# Patient Record
Sex: Male | Born: 1960 | Hispanic: Yes | Marital: Married | Smoking: Former smoker
Health system: Southern US, Community
[De-identification: ages and names within clinical notes are randomized; demographics above are authoritative.]

## PROBLEM LIST (undated history)

## (undated) DIAGNOSIS — C61 Malignant neoplasm of prostate: Secondary | ICD-10-CM

## (undated) DIAGNOSIS — J9819 Other pulmonary collapse: Secondary | ICD-10-CM

## (undated) DIAGNOSIS — K219 Gastro-esophageal reflux disease without esophagitis: Secondary | ICD-10-CM

## (undated) DIAGNOSIS — F17200 Nicotine dependence, unspecified, uncomplicated: Secondary | ICD-10-CM

## (undated) DIAGNOSIS — I10 Essential (primary) hypertension: Secondary | ICD-10-CM

## (undated) DIAGNOSIS — E785 Hyperlipidemia, unspecified: Secondary | ICD-10-CM

## (undated) DIAGNOSIS — S32000A Wedge compression fracture of unspecified lumbar vertebra, initial encounter for closed fracture: Secondary | ICD-10-CM

## (undated) DIAGNOSIS — R7303 Prediabetes: Secondary | ICD-10-CM

## (undated) DIAGNOSIS — E039 Hypothyroidism, unspecified: Secondary | ICD-10-CM

## (undated) DIAGNOSIS — F17201 Nicotine dependence, unspecified, in remission: Secondary | ICD-10-CM

## (undated) DIAGNOSIS — S32009A Unspecified fracture of unspecified lumbar vertebra, initial encounter for closed fracture: Secondary | ICD-10-CM

## (undated) HISTORY — DX: Essential (primary) hypertension: I10

## (undated) HISTORY — DX: Other pulmonary collapse: J98.19

## (undated) HISTORY — DX: Nicotine dependence, unspecified, uncomplicated: F17.200

## (undated) HISTORY — PX: ANKLE SURGERY: SHX546

---

## 2010-10-10 ENCOUNTER — Inpatient Hospital Stay: Payer: Self-pay | Admitting: Surgery

## 2010-10-10 ENCOUNTER — Ambulatory Visit: Payer: Self-pay | Admitting: Internal Medicine

## 2010-10-19 ENCOUNTER — Inpatient Hospital Stay: Payer: Self-pay | Admitting: Surgery

## 2010-10-19 ENCOUNTER — Ambulatory Visit: Payer: Self-pay | Admitting: Surgery

## 2010-11-03 ENCOUNTER — Ambulatory Visit: Payer: Self-pay | Admitting: Surgery

## 2010-11-11 ENCOUNTER — Ambulatory Visit: Payer: Self-pay | Admitting: Surgery

## 2010-11-23 ENCOUNTER — Ambulatory Visit: Payer: Self-pay | Admitting: Surgery

## 2010-12-26 DIAGNOSIS — J9819 Other pulmonary collapse: Secondary | ICD-10-CM

## 2010-12-26 HISTORY — DX: Other pulmonary collapse: J98.19

## 2010-12-26 HISTORY — PX: CHEST TUBE INSERTION: SHX231

## 2011-12-27 HISTORY — PX: OTHER SURGICAL HISTORY: SHX169

## 2012-07-28 IMAGING — CT CT CHEST W/ CM
2 of 3 series · 16 of 31 positions shown, 19 images · IV contrast (agent unspecified)
Comparison: none

REASON FOR EXAM: left pneumo, 40 pack year smoker. evaluate for bleb
disease. mass.
COMMENTS:

PROCEDURE:     CT  - CT CHEST WITH CONTRAST  - October 11, 2010  [DATE]
RESULT:     Comparison: Chest radiographs 10/10/2010 and 10/11/2010
TECHNIQUE: Multiple axial images of the chest were obtained with 70 mL
[id number] intravenous contrast.

[Series 3: lung windows · axial · 0.73mm/px · z∈[+458,+722]mm · 10 of 67 slices shown, 13 images]
[im 7/67  mediastinal]
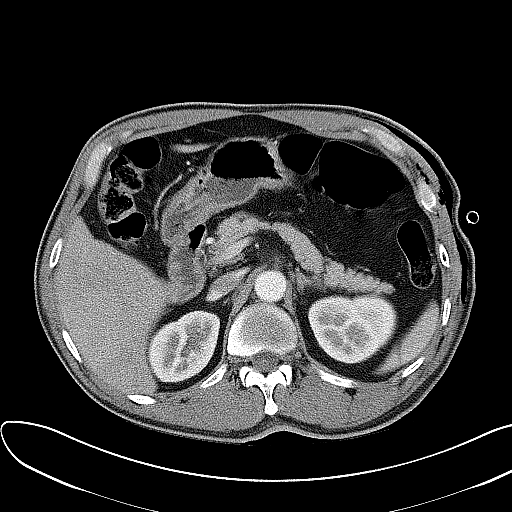
[im 7/67  lung]
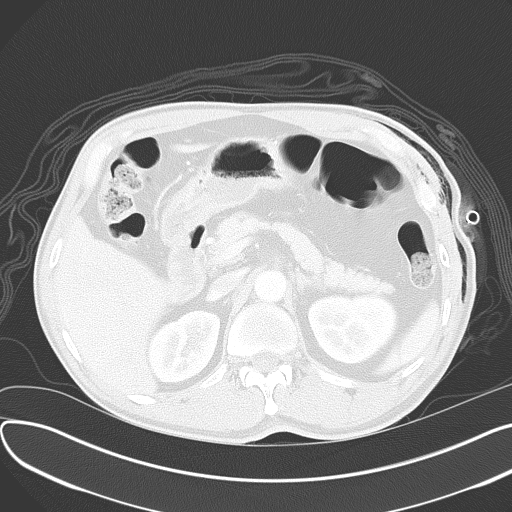
[im 14/67  lung]
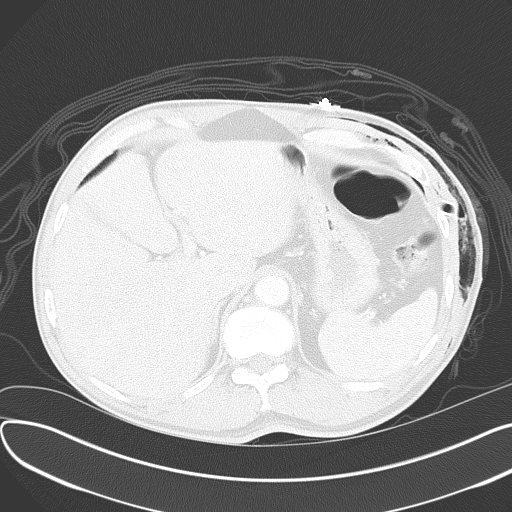
[im 20/67  lung]
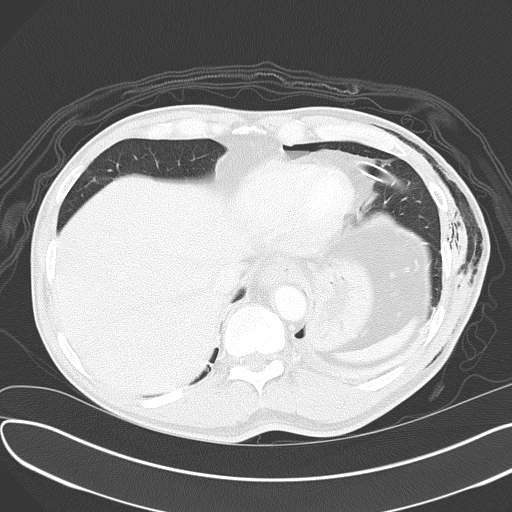
[im 27/67  lung]
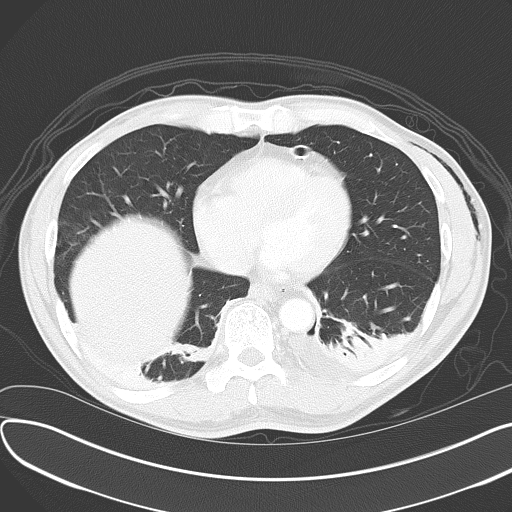
[im 33/67  mediastinal]
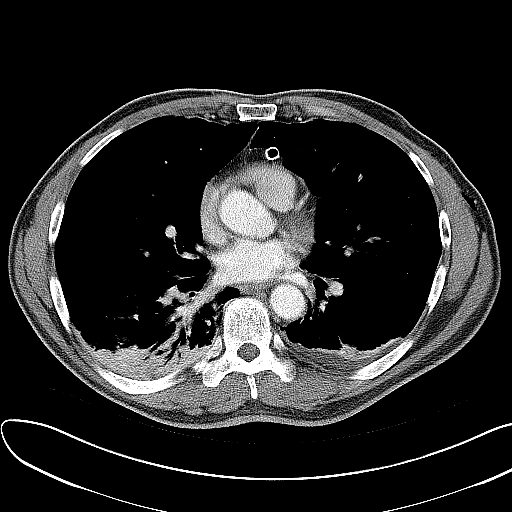
[im 33/67  lung]
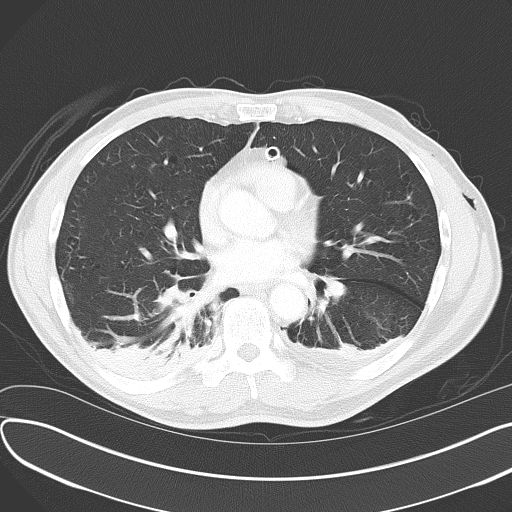
[im 34/67  lung]
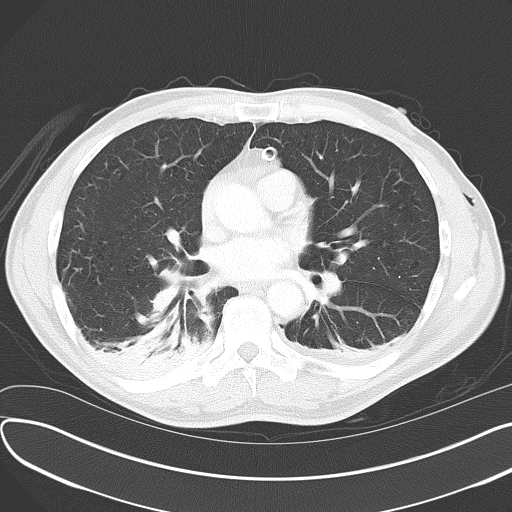
[im 40/67  lung]
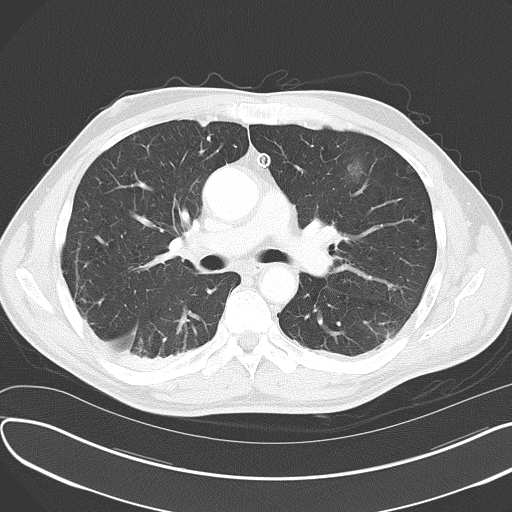
[im 47/67  lung]
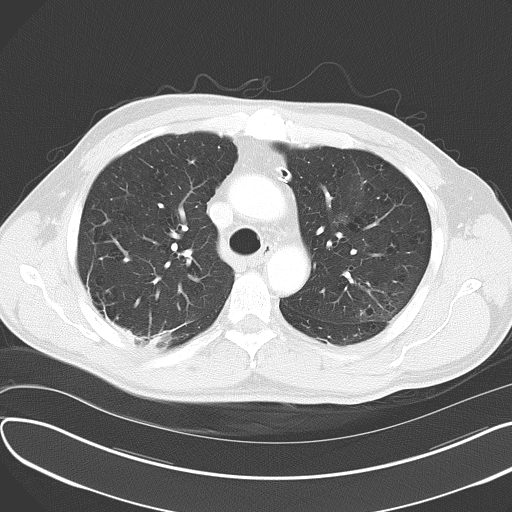
[im 53/67  mediastinal]
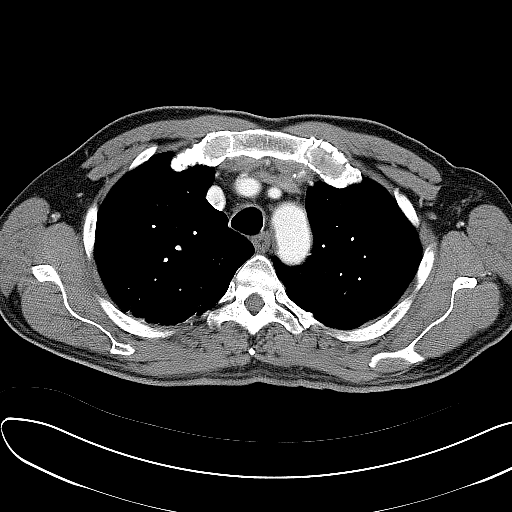
[im 53/67  lung]
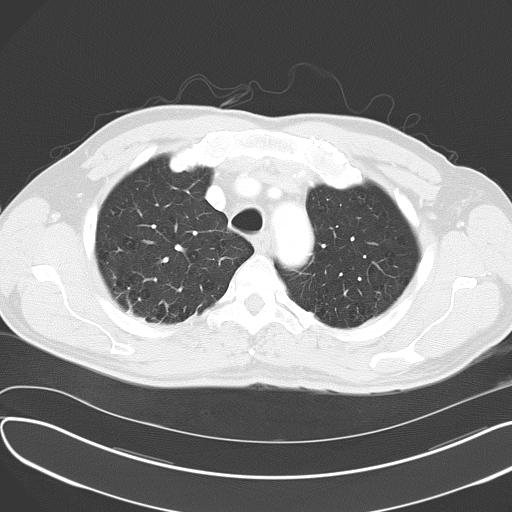
[im 60/67  lung]
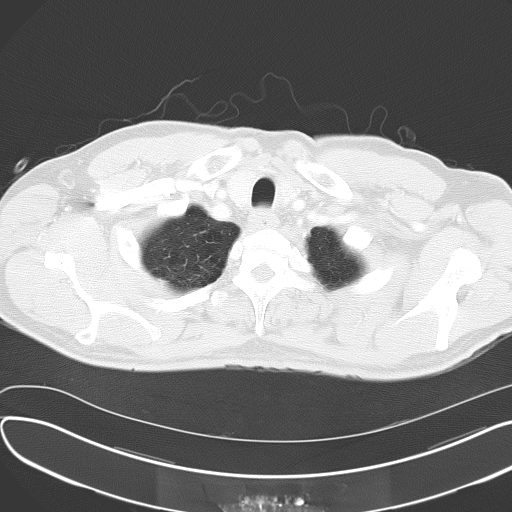

[Series 4: hi res · axial · 0.73mm/px · z∈[+483,+707]mm · 6 of 43 slices shown]
[im 8/43  lung]
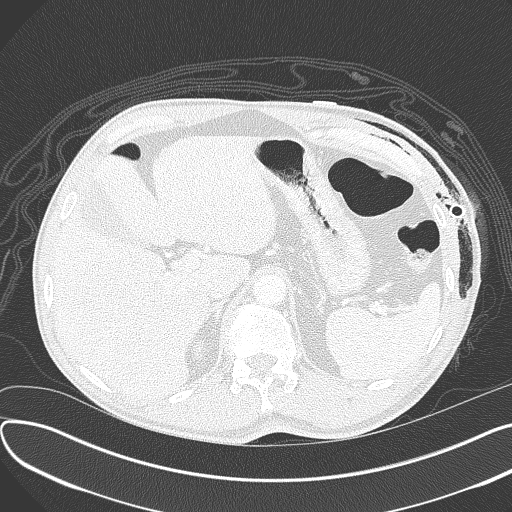
[im 15/43  lung]
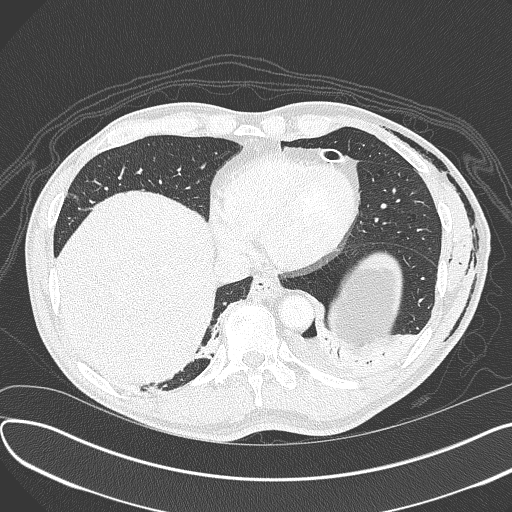
[im 21/43  lung]
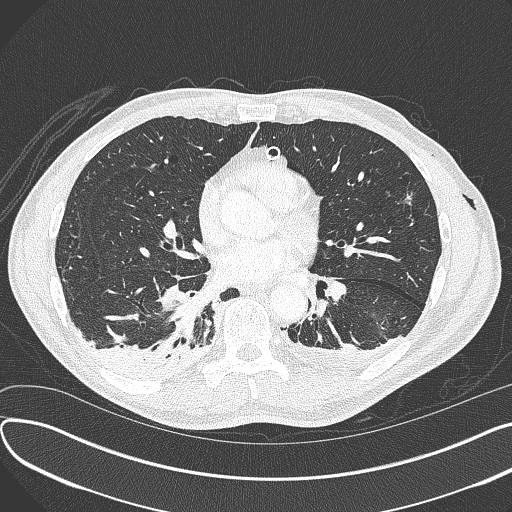
[im 22/43  lung]
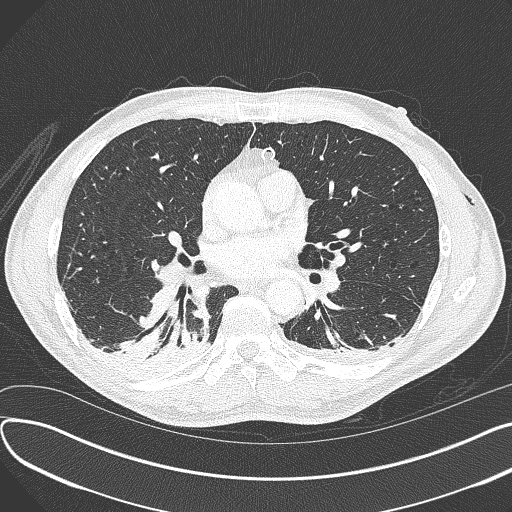
[im 29/43  lung]
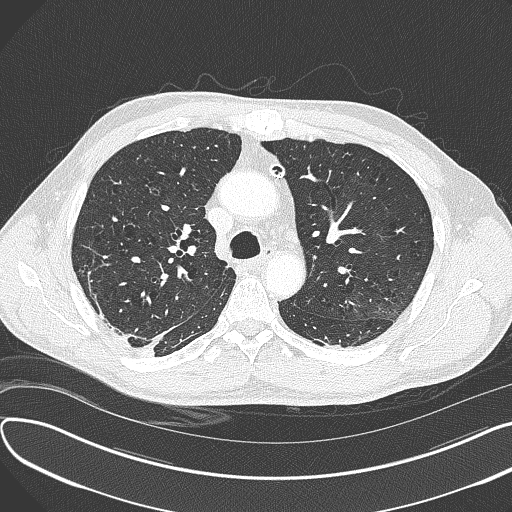
[im 36/43  lung]
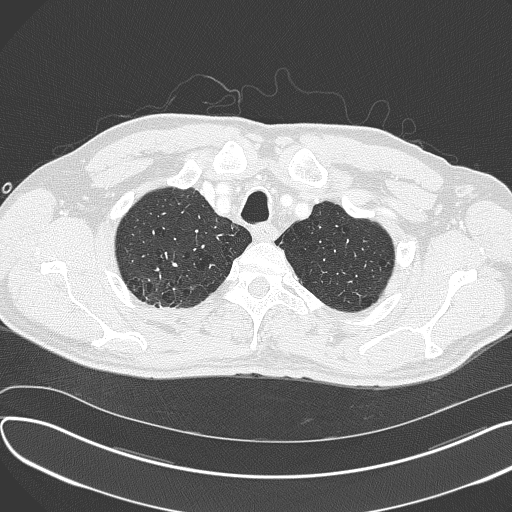

[16 of 31 positions shown; findings below may reference images not displayed]

FINDINGS: No mediastinal, hilar, or axillary lymphadenopathy. There are small
low-attenuation lesions in the liver, which are too small to characterize.
There is a left-sided chest tube, which courses along the anterior, left
side of the mediastinum.

There is mild to moderate centrilobular emphysema. Basilar opacities likely
represent atelectasis. There is a small amount of residual air in the left
pleural space.. There are scattered ground glass opacities in the left lung.
A small amount of air is seen along the left major fissure. No discrete mass
identified. Small low-attenuation within the right side of the trachea
likely represents retained mucus. There is a small left pleural effusion.

No aggressive lytic or sclerotic osseous lesions identified. There is
subcutaneous is seen extending along the left chest wall.
IMPRESSION: 1. Mild to moderate centrilobular emphysema, without discrete bulla.
2. Small amount of residual left hydropneumothorax.
3. Mild groundglass opacities in the left lung are nonspecific, but may be
infectious or inflammatory.

## 2012-08-05 IMAGING — CR DG CHEST 2V
1 series · 3 of 3 positions shown · non-contrast
Comparison: none

REASON FOR EXAM: follow left pneumothorax; recent hospitalization with CT
COMMENTS:

[Series 1: view not recorded · 0.17mm/px · 3 of 3 slices shown]
[im 1/3]
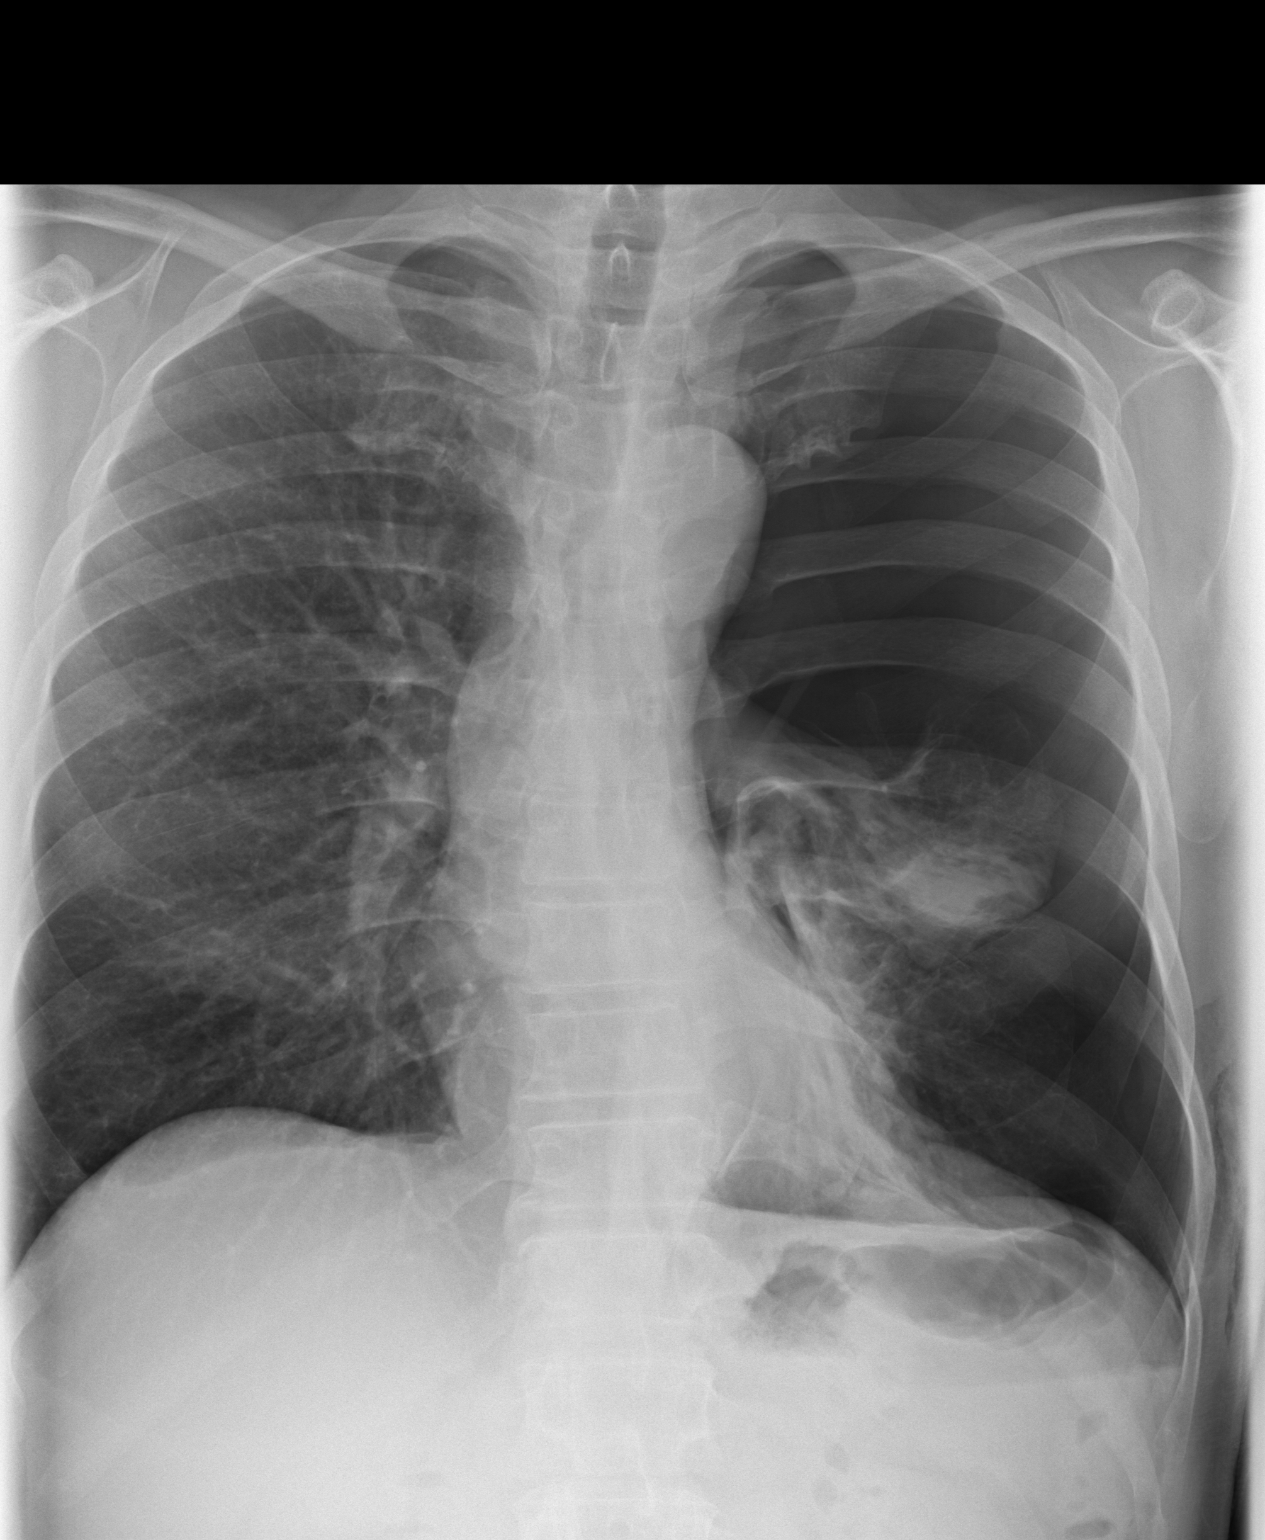
[im 2/3]
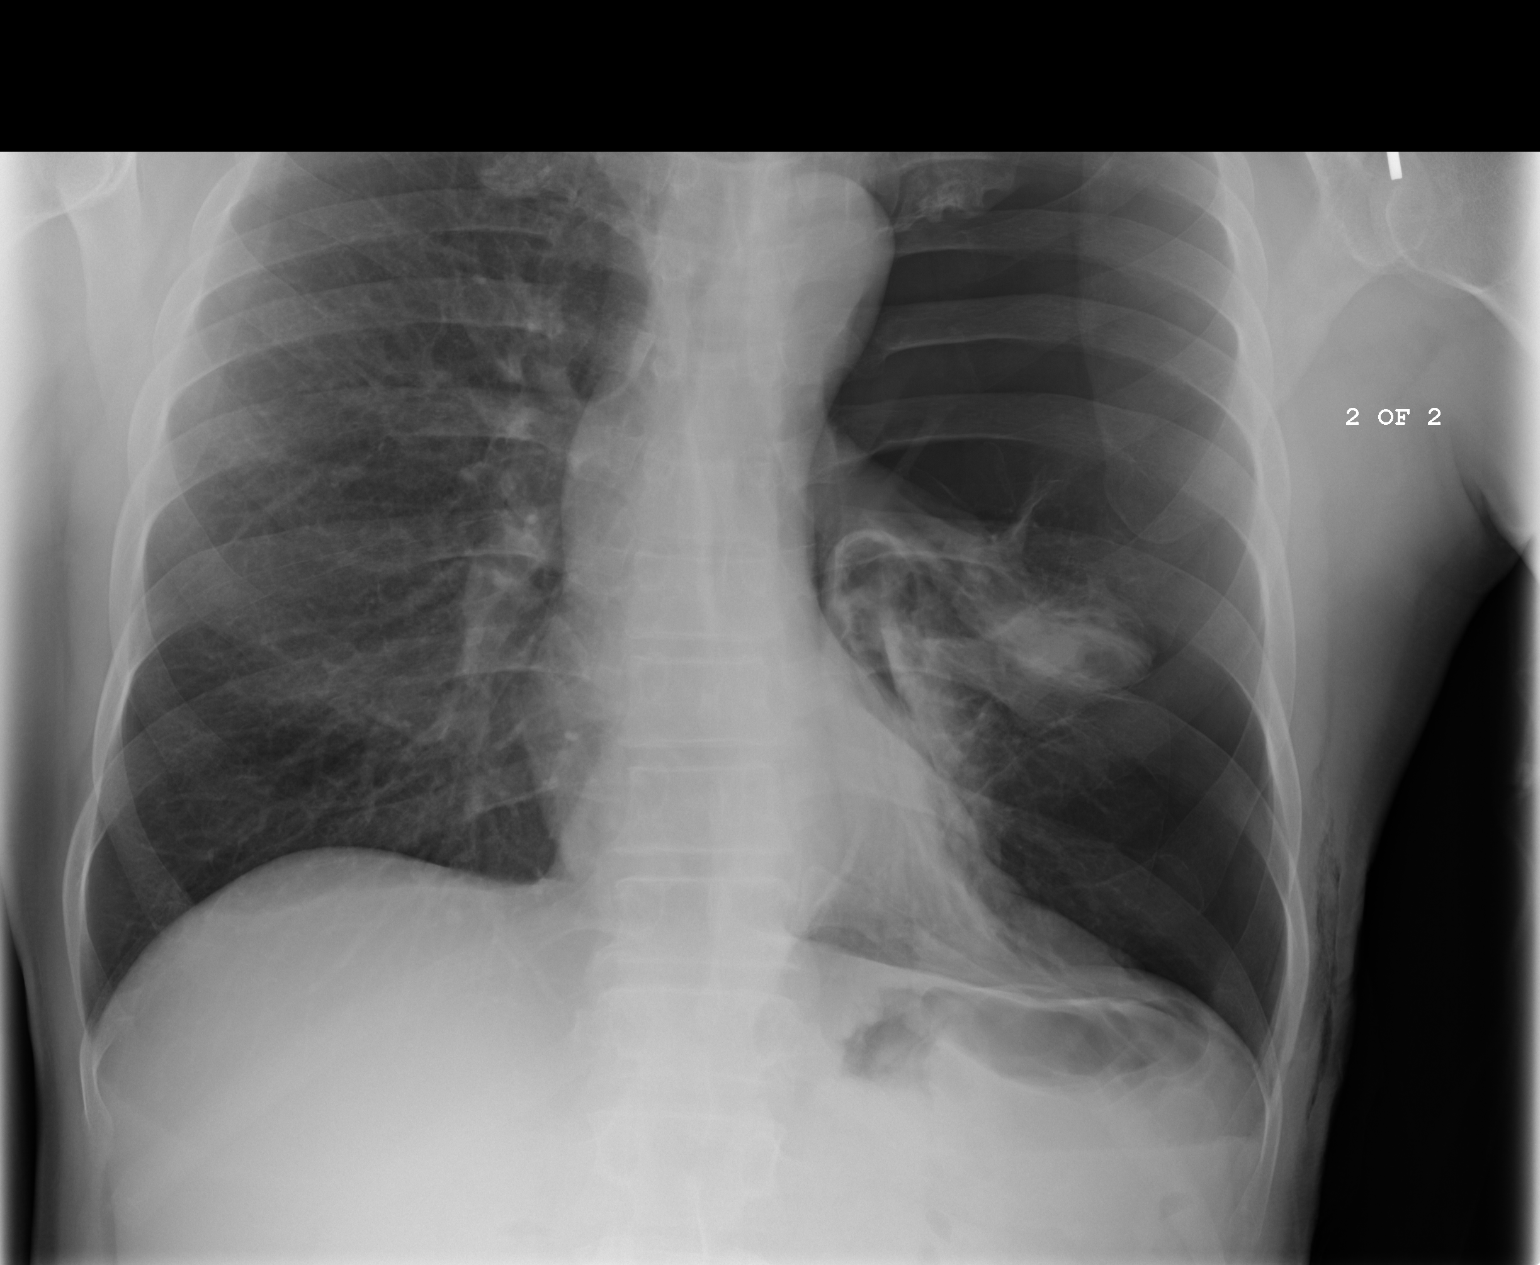
[im 3/3]
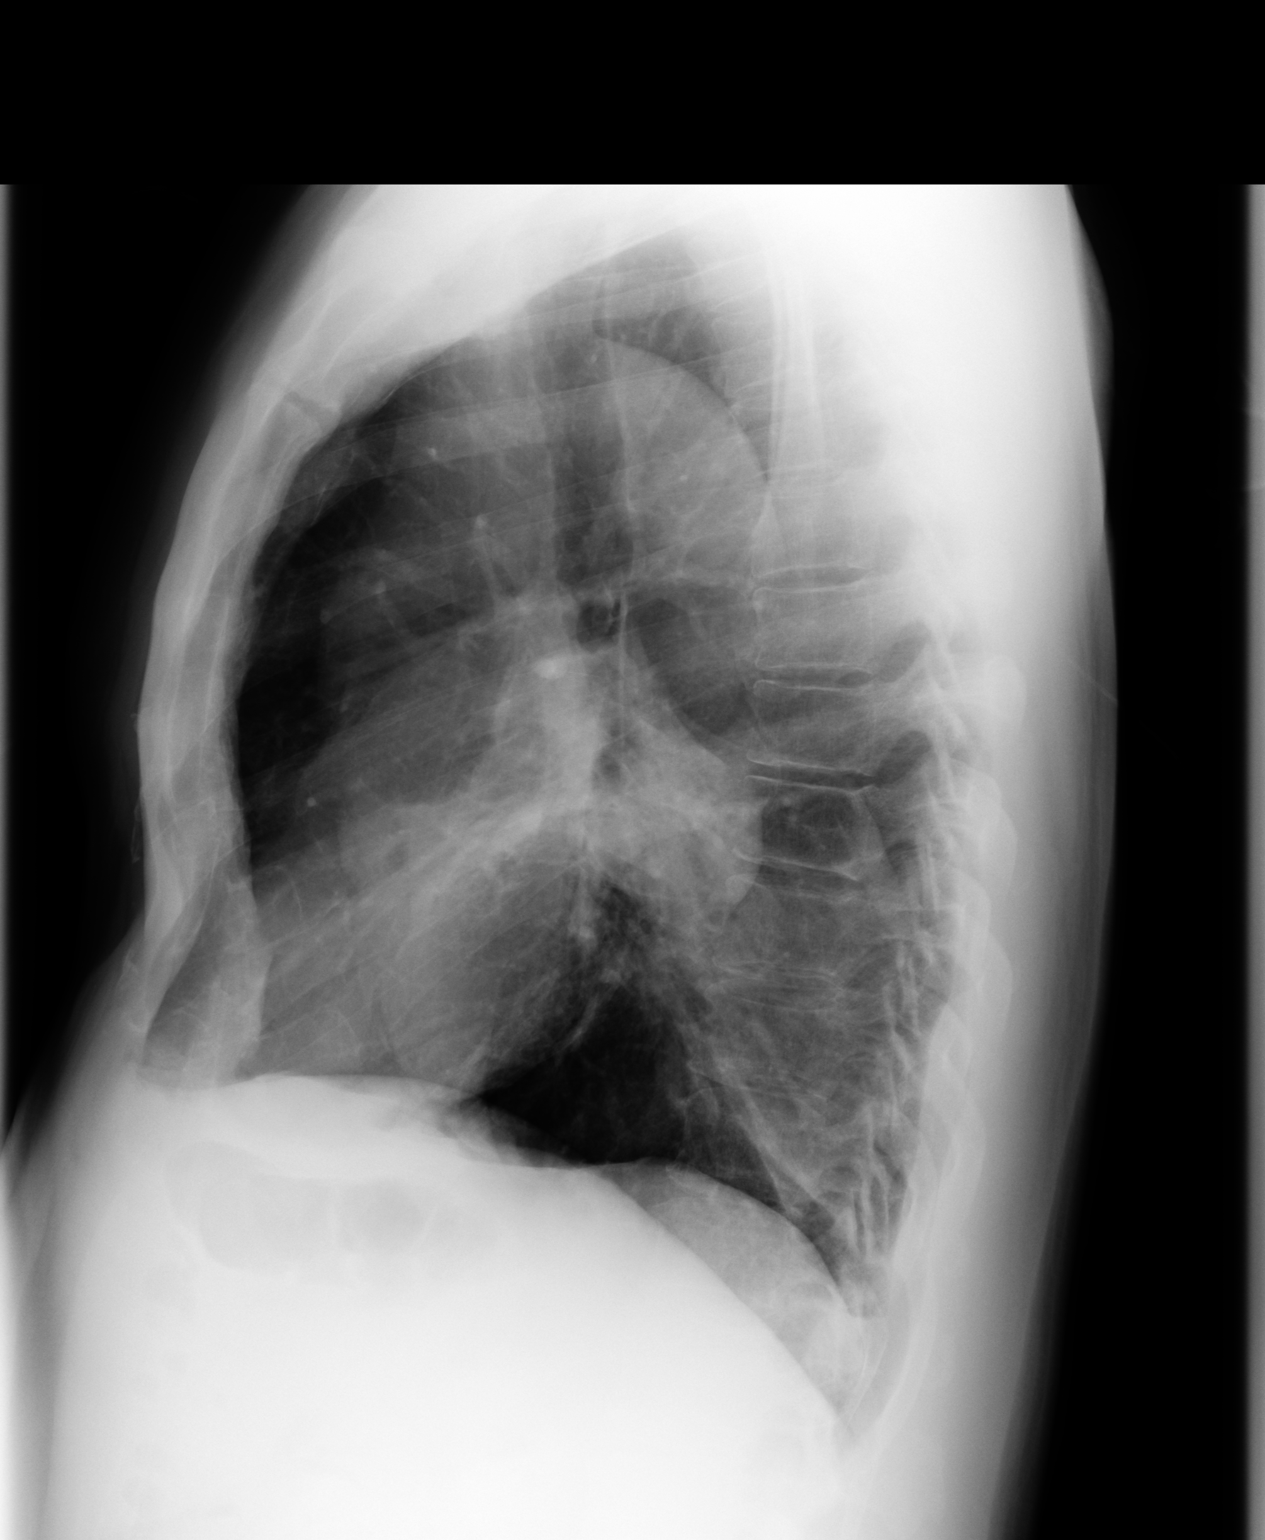

[3 of 3 positions shown; findings below may reference images not displayed]

PROCEDURE:     MDR - MDR CHEST PA(OR AP) AND LATERAL  - October 19, 2010  [DATE]

RESULT:     Comparison is made to the prior exam of 10/13/2010.

There is observed a near total pneumothorax on the left. There is no shift
of the midline structures to the left which suggests an element of tension
is present. The right lung field is clear. The heart size is normal.
IMPRESSION: There is observed near complete left pneumothorax.

## 2012-08-05 IMAGING — CR DG CHEST 1V PORT
1 series · 1 of 1 positions shown · non-contrast
Comparison: none

REASON FOR EXAM: left chest tube placement
COMMENTS:

[view not recorded]
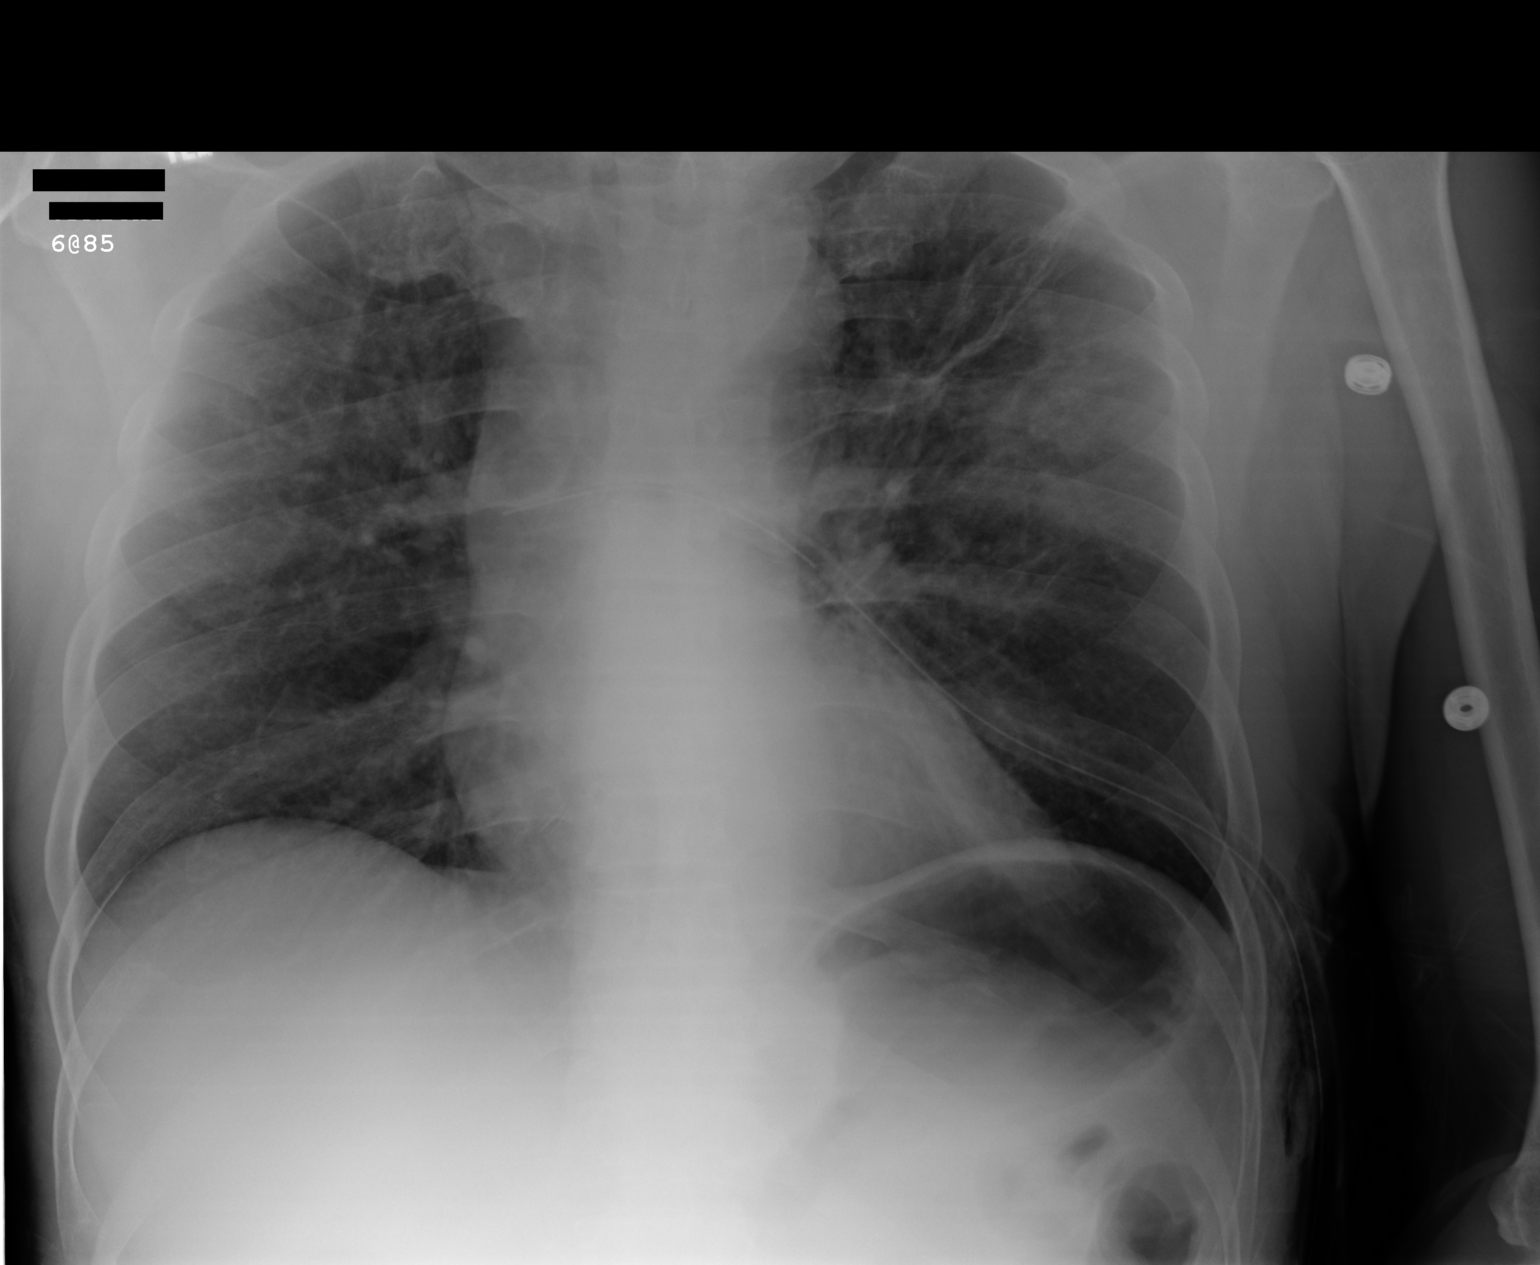

[1 of 1 positions shown; findings below may reference images not displayed]

PROCEDURE:     DXR - DXR PORTABLE CHEST SINGLE VIEW  - October 19, 2010 [DATE]

RESULT:     Comparison is made to the prior exam of earlier this day. A left
chest tube is now present. There has been complete or near complete
reexpansion of the left lung. There is thickening of the lung markings about
the hilar region compatible with residual atelectasis. The right lung field
is clear. Heart size is normal. A small amount of subcutaneous emphysema is
observed along the left lateral chest wall.
IMPRESSION: 1. There has been complete or near complete reexpansion of the left lung.
2. A left chest tube is now present.

## 2013-02-16 ENCOUNTER — Ambulatory Visit: Payer: Self-pay | Admitting: Physician Assistant

## 2013-02-16 LAB — SYNOVIAL CELL COUNT + DIFF, W/ CRYSTALS
Basophil: 0 %
Crystals, Joint Fluid: NONE SEEN
Eosinophil: 0 %
Nucleated Cell Count: 588 /mm3
Other Cells BF: 0 %
Other Mononuclear Cells: 44 %

## 2013-07-04 ENCOUNTER — Ambulatory Visit: Payer: Self-pay | Admitting: Family Medicine

## 2017-10-09 ENCOUNTER — Encounter: Payer: Self-pay | Admitting: Internal Medicine

## 2017-10-09 ENCOUNTER — Ambulatory Visit (INDEPENDENT_AMBULATORY_CARE_PROVIDER_SITE_OTHER): Payer: Managed Care, Other (non HMO) | Admitting: Internal Medicine

## 2017-10-09 VITALS — BP 152/88 | HR 69 | Ht 65.0 in | Wt 139.0 lb

## 2017-10-09 DIAGNOSIS — R030 Elevated blood-pressure reading, without diagnosis of hypertension: Secondary | ICD-10-CM | POA: Diagnosis not present

## 2017-10-09 DIAGNOSIS — F17200 Nicotine dependence, unspecified, uncomplicated: Secondary | ICD-10-CM

## 2017-10-09 DIAGNOSIS — Z0000 Encounter for general adult medical examination without abnormal findings: Secondary | ICD-10-CM | POA: Diagnosis not present

## 2017-10-09 DIAGNOSIS — R351 Nocturia: Secondary | ICD-10-CM | POA: Diagnosis not present

## 2017-10-09 DIAGNOSIS — I10 Essential (primary) hypertension: Secondary | ICD-10-CM | POA: Insufficient documentation

## 2017-10-09 HISTORY — DX: Nicotine dependence, unspecified, uncomplicated: F17.200

## 2017-10-09 MED ORDER — VARENICLINE TARTRATE 0.5 MG X 11 & 1 MG X 42 PO MISC: ORAL | 0 refills | Status: DC

## 2017-10-09 NOTE — Patient Instructions (Signed)
Check blood pressure 3 times per week and record readings - bring to next appointment  [place] stands for "Dietary Approaches to Stop Hypertension." The DASH eating plan is a healthy eating plan that has been shown to reduce high blood pressure (hypertension). It may also reduce your risk for type 2 diabetes, heart disease, and stroke. The DASH eating plan may also help with weight loss. What are tips for following this plan? General guidelines  Avoid eating more than 2,300 mg (milligrams) of salt (sodium) a day. If you have hypertension, you may need to reduce your sodium intake to 1,500 mg a day.  Limit alcohol intake to no more than 1 drink a day for nonpregnant women and 2 drinks a day for men. One drink equals 12 oz of beer, 5 oz of wine, or 1 oz of hard liquor.  Work with your health care provider to maintain a healthy body weight or to lose weight. Ask what an ideal weight is for you.  Get at least 30 minutes of exercise that causes your heart to beat faster (aerobic exercise) most days of the week. Activities may include walking, swimming, or biking.  Work with your health care provider or diet and nutrition specialist (dietitian) to adjust your eating plan to your individual calorie needs. Reading food labels  Check food labels for the amount of sodium per serving. Choose foods with less than 5 percent of the Daily Value of sodium. Generally, foods with less than 300 mg of sodium per serving fit into this eating plan.  To find whole grains, look for the word "whole" as the first word in the ingredient list. Shopping  Buy products labeled as "low-sodium" or "no salt added."  Buy fresh foods. Avoid canned foods and premade or frozen meals. Cooking  Avoid adding salt when cooking. Use salt-free seasonings or herbs instead of table salt or sea salt. Check with your health care provider or pharmacist before using salt substitutes.  Do not fry foods. Cook foods using  healthy methods such as baking, boiling, grilling, and broiling instead.  Cook with heart-healthy oils, such as olive, canola, soybean, or sunflower oil. Meal planning   Eat a balanced diet that includes: ? 5 or more servings of fruits and vegetables each day. At each meal, try to fill half of your plate with fruits and vegetables. ? Up to 6-8 servings of whole grains each day. ? Less than 6 oz of lean meat, poultry, or fish each day. A 3-oz serving of meat is about the same size as a deck of cards. One egg equals 1 oz. ? 2 servings of low-fat dairy each day. ? A serving of nuts, seeds, or beans 5 times each week. ? Heart-healthy fats. Healthy fats called Omega-3 fatty acids are found in foods such as flaxseeds and coldwater fish, like sardines, salmon, and mackerel.  Limit how much you eat of the following: ? Canned or prepackaged foods. ? Food that is high in trans fat, such as fried foods. ? Food that is high in saturated fat, such as fatty meat. ? Sweets, desserts, sugary drinks, and other foods with added sugar. ? Full-fat dairy products.  Do not salt foods before eating.  Try to eat at least 2 vegetarian meals each week.  Eat more home-cooked food and less restaurant, buffet, and fast food.  When eating at a restaurant, ask that your food be prepared with less salt or no salt, if possible. What foods are  recommended? The items listed may not be a complete list. Talk with your dietitian about what dietary choices are best for you. Grains Whole-grain or whole-wheat bread. Whole-grain or whole-wheat pasta. Brown rice. [place]. Bulgur. Whole-grain and low-sodium cereals. Pita bread. Low-fat, low-sodium crackers. Whole-wheat flour tortillas. Vegetables Fresh or frozen vegetables (raw, steamed, roasted, or grilled). Low-sodium or reduced-sodium tomato and vegetable juice. Low-sodium or reduced-sodium tomato sauce and tomato paste. Low-sodium or reduced-sodium canned  vegetables. Fruits All fresh, dried, or frozen fruit. Canned fruit in natural juice (without added sugar). Meat and other protein foods Skinless chicken or [place]. Ground chicken or [place]. Pork with fat trimmed off. Fish and seafood. Egg whites. Dried beans, peas, or lentils. Unsalted nuts, nut butters, and seeds. Unsalted canned beans. Lean cuts of beef with fat trimmed off. Low-sodium, lean deli meat. Dairy Low-fat (1%) or fat-free (skim) milk. Fat-free, low-fat, or reduced-fat cheeses. Nonfat, low-sodium ricotta or cottage cheese. Low-fat or nonfat yogurt. Low-fat, low-sodium cheese. Fats and oils Soft margarine without trans fats. Vegetable oil. Low-fat, reduced-fat, or light mayonnaise and salad dressings (reduced-sodium). Canola, safflower, olive, soybean, and sunflower oils. Avocado. Seasoning and other foods Herbs. Spices. Seasoning mixes without salt. Unsalted popcorn and pretzels. Fat-free sweets. What foods are not recommended? The items listed may not be a complete list. Talk with your dietitian about what dietary choices are best for you. Grains Baked goods made with fat, such as croissants, muffins, or some breads. Dry pasta or rice meal packs. Vegetables Creamed or fried vegetables. Vegetables in a cheese sauce. Regular canned vegetables (not low-sodium or reduced-sodium). Regular canned tomato sauce and paste (not low-sodium or reduced-sodium). Regular tomato and vegetable juice (not low-sodium or reduced-sodium). Angie Fava. Olives. Fruits Canned fruit in a light or heavy syrup. Fried fruit. Fruit in cream or butter sauce. Meat and other protein foods Fatty cuts of meat. Ribs. Fried meat. Berniece Salines. Sausage. Bologna and other processed lunch meats. Salami. Fatback. Hotdogs. Bratwurst. Salted nuts and seeds. Canned beans with added salt. Canned or smoked fish. Whole eggs or egg yolks. Chicken or [place] with skin. Dairy Whole or 2% milk, cream, and half-and-half. Whole or full-fat  cream cheese. Whole-fat or sweetened yogurt. Full-fat cheese. Nondairy creamers. Whipped toppings. Processed cheese and cheese spreads. Fats and oils Butter. Stick margarine. Lard. Shortening. Ghee. Bacon fat. Tropical oils, such as coconut, palm kernel, or palm oil. Seasoning and other foods Salted popcorn and pretzels. Onion salt, garlic salt, seasoned salt, table salt, and sea salt. Worcestershire sauce. Tartar sauce. Barbecue sauce. Teriyaki sauce. Soy sauce, including reduced-sodium. Steak sauce. Canned and packaged gravies. Fish sauce. Oyster sauce. Cocktail sauce. Horseradish that you find on the shelf. Ketchup. Mustard. Meat flavorings and tenderizers. Bouillon cubes. Hot sauce and Tabasco sauce. Premade or packaged marinades. Premade or packaged taco seasonings. Relishes. Regular salad dressings. Where to find more information:  National Heart, Lung, and [place]: https://wilson-eaton.com/  American Heart Association: www.heart.org Summary  The DASH eating plan is a healthy eating plan that has been shown to reduce high blood pressure (hypertension). It may also reduce your risk for type 2 diabetes, heart disease, and stroke.  With the DASH eating plan, you should limit salt (sodium) intake to 2,300 mg a day. If you have hypertension, you may need to reduce your sodium intake to 1,500 mg a day.  When on the DASH eating plan, aim to eat more fresh fruits and vegetables, whole grains, lean proteins, low-fat dairy, and heart-healthy fats.  Work with your health  care provider or diet and nutrition specialist (dietitian) to adjust your eating plan to your individual calorie needs. This information is not intended to replace advice given to you by your health care provider. Make sure you discuss any questions you have with your health care provider. Document Released: 12/01/2011 Document Revised: 12/05/2016 Document Reviewed: 12/05/2016 Elsevier Interactive Patient Education  2017 [employer].

## 2017-10-09 NOTE — Progress Notes (Signed)
Date:  10/09/2017   Name:  [name]   DOB:  December 20, 1961   MRN:  [id number]   Chief Complaint: Establish Care Pt here to establish care.  Not seen by MD in years.  His only medical hx is lung collapse for unknown reasons. He has not had a CPX in years.  He has no major health concerns at this time.  Nicotine Dependence  Presents for initial visit. Symptoms are negative for fatigue. Preferred tobacco types include cigarettes. His urge triggers include company of smokers. He smokes 1 pack of cigarettes per day. Past treatments include nothing. [name] is ready to quit.  Urinary Frequency   This is a new problem. The current episode started more than 1 month ago. The problem occurs intermittently. The patient is experiencing no pain. There has been no fever. He is sexually active. There is no history of pyelonephritis. Associated symptoms include frequency. Pertinent negatives include no chills or hematuria. He has tried nothing for the symptoms.    Review of Systems  Constitutional: Negative for chills, fatigue, fever and unexpected weight change.  HENT: Negative for hearing loss and trouble swallowing.   Eyes: Negative for visual disturbance.  Respiratory: Negative for cough, chest tightness, shortness of breath and wheezing.   Cardiovascular: Negative for chest pain, palpitations and leg swelling.  Gastrointestinal: Negative for abdominal pain.  Genitourinary: Positive for frequency. Negative for decreased urine volume and hematuria.       Nocturia   Musculoskeletal: Negative for arthralgias.  Skin: Negative for rash.  Neurological: Negative for dizziness and headaches.  Psychiatric/Behavioral: Negative for dysphoric mood and sleep disturbance.    Patient Active Problem List   Diagnosis Date Noted  . Tobacco use disorder 10/09/2017    Prior to Admission medications   Not on File    No Known Allergies  Past Surgical History:  Procedure Laterality Date  . lung  collapse  2013    Social History  Substance Use Topics  . Smoking status: Current Every Day Smoker    Packs/day: 1.00    Years: 40.00    Types: Cigarettes  . Smokeless tobacco: Never Used  . Alcohol use No     Medication list has been reviewed and updated.  PHQ 2/9 Scores 10/09/2017  PHQ - 2 Score 0    Physical Exam  Constitutional: He is oriented to person, place, and time. He appears well-developed. No distress.  HENT:  Head: Normocephalic and atraumatic.  Neck: Normal range of motion. Neck supple. No thyromegaly present.  Cardiovascular: Normal rate, regular rhythm and normal heart sounds.   Pulmonary/Chest: Effort normal and breath sounds normal. No respiratory distress. He has no wheezes.  Abdominal: Soft. Bowel sounds are normal. He exhibits no mass. There is no tenderness. There is no rebound and no guarding.  Musculoskeletal: Normal range of motion. He exhibits no edema or tenderness.  Neurological: He is alert and oriented to person, place, and time.  Skin: Skin is warm and dry. No rash noted.  Psychiatric: He has a normal mood and affect. His behavior is normal. Thought content normal.  Nursing note and vitals reviewed.   BP (!) 152/88   Pulse 69   Ht 5\' 5"  (1.651 m)   Wt 139 lb (63 kg)   SpO2 97%   BMI 23.13 kg/m   Assessment and Plan: 1. Annual physical exam Discussed low sodium diet Smoking cessation Pt has had influenza vaccine at work  2. Elevated blood pressure reading Pt  to monitor BP at home and record three times per week Sodium restriction - CBC with Differential/Platelet - Comprehensive metabolic panel - TSH  3. Tobacco use disorder - varenicline (CHANTIX STARTING MONTH PAK) 0.5 MG X 11 & 1 MG X 42 tablet; Take one 0.5 mg tablet by mouth once daily for 3 days, then increase to one 0.5 mg tablet twice daily for 4 days, then increase to one 1 mg tablet twice daily.  Dispense: 53 tablet; Refill: 0  4. Nocturia DRE deferred but will check  PSA with family hx - PSA   Meds ordered this encounter  Medications  . varenicline (CHANTIX STARTING MONTH PAK) 0.5 MG X 11 & 1 MG X 42 tablet    Sig: Take one 0.5 mg tablet by mouth once daily for 3 days, then increase to one 0.5 mg tablet twice daily for 4 days, then increase to one 1 mg tablet twice daily.    Dispense:  53 tablet    Refill:  0    Partially dictated using Editor, commissioning. Any errors are unintentional.  Ryan Maidens, MD [facility]  10/09/2017

## 2017-10-10 LAB — CBC WITH DIFFERENTIAL/PLATELET
BASOS ABS: 0.1 10*3/uL (ref 0.0–0.2)
Basos: 1 %
EOS (ABSOLUTE): 0.2 10*3/uL (ref 0.0–0.4)
Eos: 2 %
Hematocrit: 54 % — ABNORMAL HIGH (ref 37.5–51.0)
Hemoglobin: 18.4 g/dL — ABNORMAL HIGH (ref 13.0–17.7)
Immature Grans (Abs): 0 10*3/uL (ref 0.0–0.1)
Immature Granulocytes: 0 %
Lymphocytes Absolute: 3.7 10*3/uL — ABNORMAL HIGH (ref 0.7–3.1)
Lymphs: 40 %
MCH: 30.9 pg (ref 26.6–33.0)
MCHC: 34.1 g/dL (ref 31.5–35.7)
MCV: 91 fL (ref 79–97)
MONOS ABS: 0.6 10*3/uL (ref 0.1–0.9)
Monocytes: 7 %
Neutrophils Absolute: 4.7 10*3/uL (ref 1.4–7.0)
Neutrophils: 50 %
PLATELETS: 272 10*3/uL (ref 150–379)
RBC: 5.96 x10E6/uL — AB (ref 4.14–5.80)
RDW: 14.5 % (ref 12.3–15.4)
WBC: 9.3 10*3/uL (ref 3.4–10.8)

## 2017-10-10 LAB — COMPREHENSIVE METABOLIC PANEL
ALK PHOS: 69 IU/L (ref 39–117)
ALT: 26 IU/L (ref 0–44)
AST: 19 IU/L (ref 0–40)
Albumin/Globulin Ratio: 1.9 (ref 1.2–2.2)
Albumin: 5 g/dL (ref 3.5–5.5)
BUN/Creatinine Ratio: 15 (ref 9–20)
BUN: 15 mg/dL (ref 6–24)
Bilirubin Total: 0.4 mg/dL (ref 0.0–1.2)
CO2: 24 mmol/L (ref 20–29)
CREATININE: 1.02 mg/dL (ref 0.76–1.27)
Calcium: 10.1 mg/dL (ref 8.7–10.2)
Chloride: 101 mmol/L (ref 96–106)
GFR calc Af Amer: 93 mL/min/{1.73_m2} (ref >59)
GFR calc non Af Amer: 81 mL/min/{1.73_m2} (ref >59)
GLOBULIN, TOTAL: 2.7 g/dL (ref 1.5–4.5)
GLUCOSE: 87 mg/dL (ref 65–99)
Potassium: 4.7 mmol/L (ref 3.5–5.2)
SODIUM: 143 mmol/L (ref 134–144)
Total Protein: 7.7 g/dL (ref 6.0–8.5)

## 2017-10-10 LAB — PSA: PROSTATE SPECIFIC AG, SERUM: 4.5 ng/mL — AB (ref 0.0–4.0)

## 2017-10-10 LAB — TSH: TSH: 7.69 u[IU]/mL — AB (ref 0.450–4.500)

## 2017-10-12 LAB — SPECIMEN STATUS REPORT

## 2017-10-12 LAB — T4: T4 TOTAL: 6.6 ug/dL (ref 4.5–12.0)

## 2017-10-12 LAB — T3: T3 TOTAL: 105 ng/dL (ref 71–180)

## 2017-11-02 ENCOUNTER — Other Ambulatory Visit: Payer: Self-pay | Admitting: Internal Medicine

## 2017-11-02 DIAGNOSIS — F17200 Nicotine dependence, unspecified, uncomplicated: Secondary | ICD-10-CM

## 2017-11-06 ENCOUNTER — Other Ambulatory Visit: Payer: Self-pay | Admitting: Internal Medicine

## 2017-11-06 ENCOUNTER — Telehealth: Payer: Self-pay

## 2017-11-06 MED ORDER — VARENICLINE TARTRATE 1 MG PO TABS: 1.0000 mg | ORAL_TABLET | Freq: Two times a day (BID) | ORAL | 3 refills | Status: DC

## 2017-11-06 NOTE — Telephone Encounter (Signed)
Patient wife called- would like refill on chantix prescription. He is on his last pill today. Wants sent to [facility] in [place]. Please Advise.

## 2017-11-24 ENCOUNTER — Encounter: Payer: Self-pay | Admitting: Internal Medicine

## 2017-11-24 ENCOUNTER — Ambulatory Visit: Payer: Managed Care, Other (non HMO) | Admitting: Internal Medicine

## 2017-11-24 VITALS — BP 158/84 | HR 95 | Resp 16 | Ht 65.0 in | Wt 152.0 lb

## 2017-11-24 DIAGNOSIS — I10 Essential (primary) hypertension: Secondary | ICD-10-CM | POA: Diagnosis not present

## 2017-11-24 DIAGNOSIS — F17200 Nicotine dependence, unspecified, uncomplicated: Secondary | ICD-10-CM | POA: Diagnosis not present

## 2017-11-24 DIAGNOSIS — R972 Elevated prostate specific antigen [PSA]: Secondary | ICD-10-CM

## 2017-11-24 MED ORDER — VARENICLINE TARTRATE 1 MG PO TABS: 1.0000 mg | ORAL_TABLET | Freq: Two times a day (BID) | ORAL | 3 refills | Status: DC

## 2017-11-24 MED ORDER — OLMESARTAN MEDOXOMIL 20 MG PO TABS: 20.0000 mg | ORAL_TABLET | Freq: Every day | ORAL | 3 refills | Status: DC

## 2017-11-24 NOTE — Progress Notes (Signed)
Date:  11/24/2017   Name:  [name]   DOB:  1961/12/03   MRN:  [id number]   Chief Complaint: Hypertension (6 week f/u ) Hypertension  This is a new problem. The current episode started more than 1 month ago. The problem is unchanged. The problem is uncontrolled. Pertinent negatives include no chest pain, headaches, palpitations or shortness of breath. There are no associated agents to hypertension. Risk factors for coronary artery disease include smoking/tobacco exposure.   Tobacco use - he has cut back from more than a pack a day to 2-3 cigarettes per day with Chantix.  He wants to know how long he can continue it.  Elevated PSA - seen on last visit.  Since only mildly elevated, will repeat in 4-6 months.   Review of Systems  Constitutional: Negative for chills, fatigue and fever.  Eyes: Negative for visual disturbance.  Respiratory: Negative for chest tightness, shortness of breath and wheezing.   Cardiovascular: Negative for chest pain, palpitations and leg swelling.  Genitourinary: Negative for difficulty urinating.  Neurological: Negative for dizziness and headaches.    Patient Active Problem List   Diagnosis Date Noted  . Tobacco use disorder 10/09/2017  . Nocturia 10/09/2017  . Elevated blood pressure reading 10/09/2017    Prior to Admission medications   Medication Sig Start Date End Date Taking? Authorizing Provider  varenicline (CHANTIX CONTINUING MONTH PAK) 1 MG tablet Take 1 tablet (1 mg total) 2 (two) times daily by mouth. 11/06/17  Yes Glean Hess, MD    No Known Allergies  Past Surgical History:  Procedure Laterality Date  . lung collapse  2013    Social History   Tobacco Use  . Smoking status: Current Every Day Smoker    Packs/day: 0.25    Years: 40.00    Pack years: 10.00    Types: Cigarettes  . Smokeless tobacco: Never Used  Substance Use Topics  . Alcohol use: No  . Drug use: No     Medication list has been reviewed and  updated.  PHQ 2/9 Scores 10/09/2017  PHQ - 2 Score 0    Physical Exam  Constitutional: He is oriented to person, place, and time. He appears well-developed. No distress.  HENT:  Head: Normocephalic and atraumatic.  Neck: Normal range of motion. Neck supple. No thyromegaly present.  Cardiovascular: Normal rate, regular rhythm and normal heart sounds.  Pulmonary/Chest: Effort normal and breath sounds normal. No respiratory distress.  Musculoskeletal: Normal range of motion. He exhibits no edema or tenderness.  Neurological: He is alert and oriented to person, place, and time.  Skin: Skin is warm and dry. No rash noted.  Psychiatric: He has a normal mood and affect. His behavior is normal. Thought content normal.  Nursing note and vitals reviewed.   BP (!) 158/84   Pulse 95   Resp 16   Ht 5\' 5"  (1.651 m)   Wt 152 lb (68.9 kg) Comment: Last weight wrong in chart should have been 149 lb  SpO2 96%   BMI 25.29 kg/m   Assessment and Plan: 1. Essential hypertension Begin medication - olmesartan (BENICAR) 20 MG tablet; Take 1 tablet (20 mg total) by mouth daily.  Dispense: 30 tablet; Refill: 3  2. Elevated PSA Repeat next visit  3. Tobacco use disorder Doing well - continue chantix for up to 6 months - varenicline (CHANTIX CONTINUING MONTH PAK) 1 MG tablet; Take 1 tablet (1 mg total) by mouth 2 (two) times daily.  Dispense: 60 tablet; Refill: 3   Meds ordered this encounter  Medications  . varenicline (CHANTIX CONTINUING MONTH PAK) 1 MG tablet    Sig: Take 1 tablet (1 mg total) by mouth 2 (two) times daily.    Dispense:  60 tablet    Refill:  3  . olmesartan (BENICAR) 20 MG tablet    Sig: Take 1 tablet (20 mg total) by mouth daily.    Dispense:  30 tablet    Refill:  3    Partially dictated using Editor, commissioning. Any errors are unintentional.  Halina Maidens, MD [facility]  11/24/2017

## 2018-01-26 ENCOUNTER — Encounter: Payer: Self-pay | Admitting: Internal Medicine

## 2018-01-26 ENCOUNTER — Ambulatory Visit: Payer: Managed Care, Other (non HMO) | Admitting: Internal Medicine

## 2018-01-26 VITALS — BP 138/70 | HR 76 | Ht 66.0 in | Wt 156.0 lb

## 2018-01-26 DIAGNOSIS — F17201 Nicotine dependence, unspecified, in remission: Secondary | ICD-10-CM

## 2018-01-26 DIAGNOSIS — E782 Mixed hyperlipidemia: Secondary | ICD-10-CM

## 2018-01-26 DIAGNOSIS — I10 Essential (primary) hypertension: Secondary | ICD-10-CM | POA: Diagnosis not present

## 2018-01-26 MED ORDER — OLMESARTAN MEDOXOMIL 20 MG PO TABS: 20.0000 mg | ORAL_TABLET | Freq: Every day | ORAL | 12 refills | Status: DC

## 2018-01-26 NOTE — Progress Notes (Signed)
Date:  01/26/2018   Name:  [name]   DOB:  [birth year]   MRN:  [id number]   Chief Complaint: Hypertension (feeling fine) Hypertension  This is a chronic problem. The problem is controlled. Pertinent negatives include no chest pain, headaches, palpitations or shortness of breath. Past treatments include angiotensin blockers. There are no compliance problems.   Nicotine Dependence  Presents for follow-up (started Chantix last fall) visit. His urge triggers include company of smokers. The symptoms have been resolved. Number of cigarettes per day: has not smoked in 8 weeks.      Review of Systems  Constitutional: Negative for chills, fever and unexpected weight change.  Eyes: Negative for visual disturbance.  Respiratory: Negative for chest tightness, shortness of breath and wheezing.   Cardiovascular: Negative for chest pain and palpitations.  Gastrointestinal: Negative for abdominal pain.  Neurological: Negative for dizziness and headaches.    Patient Active Problem List   Diagnosis Date Noted  . Tobacco use disorder 10/09/2017  . Nocturia 10/09/2017  . Essential hypertension 10/09/2017    Prior to Admission medications   Medication Sig Start Date End Date Taking? Authorizing Provider  olmesartan (BENICAR) 20 MG tablet Take 1 tablet (20 mg total) by mouth daily. 11/24/17  Yes Glean Hess, MD  varenicline (CHANTIX CONTINUING MONTH PAK) 1 MG tablet Take 1 tablet (1 mg total) by mouth 2 (two) times daily. 11/24/17   Glean Hess, MD    No Known Allergies  Past Surgical History:  Procedure Laterality Date  . lung collapse  2013    Social History   Tobacco Use  . Smoking status: Former Smoker    Packs/day: 0.25    Years: 40.00    Pack years: 10.00    Types: Cigarettes    Last attempt to quit: 11/25/2017    Years since quitting: 0.1  . Smokeless tobacco: Never Used  Substance Use Topics  . Alcohol use: No  . Drug use: No     Medication list has  been reviewed and updated.  PHQ 2/9 Scores 10/09/2017  PHQ - 2 Score 0    Physical Exam  Constitutional: He is oriented to person, place, and time. He appears well-developed. No distress.  HENT:  Head: Normocephalic and atraumatic.  Neck: Normal range of motion. Neck supple. No thyromegaly present.  Cardiovascular: Normal rate, regular rhythm and normal heart sounds.  Pulmonary/Chest: Effort normal and breath sounds normal. No respiratory distress. He has no wheezes.  Musculoskeletal: Normal range of motion. He exhibits no edema or tenderness.  Neurological: He is alert and oriented to person, place, and time.  Skin: Skin is warm and dry. No rash noted.  Psychiatric: He has a normal mood and affect. His behavior is normal. Thought content normal.  Nursing note and vitals reviewed.   BP 138/70   Pulse 76   Ht 5\' 6"  (1.676 m)   Wt 156 lb (70.8 kg)   BMI 25.18 kg/m   Assessment and Plan: 1. Essential hypertension controlled - olmesartan (BENICAR) 20 MG tablet; Take 1 tablet (20 mg total) by mouth daily.  Dispense: 30 tablet; Refill: 12 - Basic metabolic panel  2. Tobacco use disorder, mild, in early remission Pt congratulated  3. Mixed hyperlipidemia Check labs - Lipid panel   Meds ordered this encounter  Medications  . olmesartan (BENICAR) 20 MG tablet    Sig: Take 1 tablet (20 mg total) by mouth daily.    Dispense:  30 tablet  Refill:  12    Partially dictated using Editor, commissioning. Any errors are unintentional.  Halina Maidens, MD [facility]  01/26/2018

## 2018-01-27 ENCOUNTER — Encounter: Payer: Self-pay | Admitting: Internal Medicine

## 2018-01-27 DIAGNOSIS — E782 Mixed hyperlipidemia: Secondary | ICD-10-CM | POA: Insufficient documentation

## 2018-01-27 LAB — BASIC METABOLIC PANEL
BUN/Creatinine Ratio: 17 (ref 9–20)
BUN: 16 mg/dL (ref 6–24)
CO2: 23 mmol/L (ref 20–29)
Calcium: 9.4 mg/dL (ref 8.7–10.2)
Chloride: 102 mmol/L (ref 96–106)
Creatinine, Ser: 0.95 mg/dL (ref 0.76–1.27)
GFR calc Af Amer: 102 mL/min/{1.73_m2} (ref >59)
GFR calc non Af Amer: 88 mL/min/{1.73_m2} (ref >59)
Glucose: 100 mg/dL — ABNORMAL HIGH (ref 65–99)
POTASSIUM: 4.6 mmol/L (ref 3.5–5.2)
SODIUM: 140 mmol/L (ref 134–144)

## 2018-01-27 LAB — LIPID PANEL
CHOL/HDL RATIO: 6.2 ratio — AB (ref 0.0–5.0)
CHOLESTEROL TOTAL: 236 mg/dL — AB (ref 100–199)
HDL: 38 mg/dL — ABNORMAL LOW (ref >39)
LDL Calculated: 166 mg/dL — ABNORMAL HIGH (ref 0–99)
TRIGLYCERIDES: 161 mg/dL — AB (ref 0–149)
VLDL Cholesterol Cal: 32 mg/dL (ref 5–40)

## 2018-01-30 ENCOUNTER — Other Ambulatory Visit: Payer: Self-pay

## 2018-01-30 MED ORDER — ATORVASTATIN CALCIUM 10 MG PO TABS: 10.0000 mg | ORAL_TABLET | Freq: Every day | ORAL | 1 refills | Status: DC

## 2018-01-30 NOTE — Progress Notes (Signed)
Spoke with wife about patients labs. She said he would be willing to begin cholesterol medication. Sent in Atorvastatin 10 mg and will check cholesterol at physical in august. Patient wife verbalized understanding.

## 2018-03-18 ENCOUNTER — Other Ambulatory Visit: Payer: Self-pay | Admitting: Internal Medicine

## 2018-03-18 DIAGNOSIS — I10 Essential (primary) hypertension: Secondary | ICD-10-CM

## 2018-07-27 ENCOUNTER — Encounter: Payer: Self-pay | Admitting: Internal Medicine

## 2018-07-27 ENCOUNTER — Ambulatory Visit (INDEPENDENT_AMBULATORY_CARE_PROVIDER_SITE_OTHER): Payer: Managed Care, Other (non HMO) | Admitting: Internal Medicine

## 2018-07-27 VITALS — BP 124/78 | HR 59 | Resp 12 | Ht 65.0 in | Wt 158.0 lb

## 2018-07-27 DIAGNOSIS — I10 Essential (primary) hypertension: Secondary | ICD-10-CM

## 2018-07-27 DIAGNOSIS — Z0000 Encounter for general adult medical examination without abnormal findings: Secondary | ICD-10-CM

## 2018-07-27 DIAGNOSIS — Z1159 Encounter for screening for other viral diseases: Secondary | ICD-10-CM

## 2018-07-27 DIAGNOSIS — Z125 Encounter for screening for malignant neoplasm of prostate: Secondary | ICD-10-CM | POA: Diagnosis not present

## 2018-07-27 DIAGNOSIS — Z1211 Encounter for screening for malignant neoplasm of colon: Secondary | ICD-10-CM | POA: Diagnosis not present

## 2018-07-27 DIAGNOSIS — R7611 Nonspecific reaction to tuberculin skin test without active tuberculosis: Secondary | ICD-10-CM

## 2018-07-27 DIAGNOSIS — E782 Mixed hyperlipidemia: Secondary | ICD-10-CM

## 2018-07-27 DIAGNOSIS — C61 Malignant neoplasm of prostate: Secondary | ICD-10-CM | POA: Insufficient documentation

## 2018-07-27 DIAGNOSIS — R972 Elevated prostate specific antigen [PSA]: Secondary | ICD-10-CM

## 2018-07-27 HISTORY — DX: Nonspecific reaction to tuberculin skin test without active tuberculosis: R76.11

## 2018-07-27 LAB — POCT URINALYSIS DIPSTICK
Bilirubin, UA: NEGATIVE
Blood, UA: NEGATIVE
GLUCOSE UA: NEGATIVE
KETONES UA: NEGATIVE
LEUKOCYTES UA: NEGATIVE
NITRITE UA: NEGATIVE
PROTEIN UA: NEGATIVE
Spec Grav, UA: 1.015 (ref 1.010–1.025)
Urobilinogen, UA: 0.2 E.U./dL
pH, UA: 6 (ref 5.0–8.0)

## 2018-07-27 MED ORDER — ATORVASTATIN CALCIUM 10 MG PO TABS: 10.0000 mg | ORAL_TABLET | Freq: Every day | ORAL | 5 refills | Status: DC

## 2018-07-27 MED ORDER — OLMESARTAN MEDOXOMIL 20 MG PO TABS: 20.0000 mg | ORAL_TABLET | Freq: Every day | ORAL | 5 refills | Status: DC

## 2018-07-27 NOTE — Patient Instructions (Signed)
DASH Eating Plan DASH stands for "Dietary Approaches to Stop Hypertension." The DASH eating plan is a healthy eating plan that has been shown to reduce high blood pressure (hypertension). It may also reduce your risk for type 2 diabetes, heart disease, and stroke. The DASH eating plan may also help with weight loss. What are tips for following this plan? General guidelines  Avoid eating more than 2,300 mg (milligrams) of salt (sodium) a day. If you have hypertension, you may need to reduce your sodium intake to 1,500 mg a day.  Limit alcohol intake to no more than 1 drink a day for nonpregnant women and 2 drinks a day for men. One drink equals 12 oz of beer, 5 oz of wine, or 1 oz of hard liquor.  Work with your health care provider to maintain a healthy body weight or to lose weight. Ask what an ideal weight is for you.  Get at least 30 minutes of exercise that causes your heart to beat faster (aerobic exercise) most days of the week. Activities may include walking, swimming, or biking.  Work with your health care provider or diet and nutrition specialist (dietitian) to adjust your eating plan to your individual calorie needs. Reading food labels  Check food labels for the amount of sodium per serving. Choose foods with less than 5 percent of the Daily Value of sodium. Generally, foods with less than 300 mg of sodium per serving fit into this eating plan.  To find whole grains, look for the word "whole" as the first word in the ingredient list. Shopping  Buy products labeled as "low-sodium" or "no salt added."  Buy fresh foods. Avoid canned foods and premade or frozen meals. Cooking  Avoid adding salt when cooking. Use salt-free seasonings or herbs instead of table salt or sea salt. Check with your health care provider or pharmacist before using salt substitutes.  Do not fry foods. Cook foods using healthy methods such as baking, boiling, grilling, and broiling instead.  Cook with  heart-healthy oils, such as olive, canola, soybean, or sunflower oil. Meal planning   Eat a balanced diet that includes: ? 5 or more servings of fruits and vegetables each day. At each meal, try to fill half of your plate with fruits and vegetables. ? Up to 6-8 servings of whole grains each day. ? Less than 6 oz of lean meat, poultry, or fish each day. A 3-oz serving of meat is about the same size as a deck of cards. One egg equals 1 oz. ? 2 servings of low-fat dairy each day. ? A serving of nuts, seeds, or beans 5 times each week. ? Heart-healthy fats. Healthy fats called Omega-3 fatty acids are found in foods such as flaxseeds and coldwater fish, like sardines, salmon, and mackerel.  Limit how much you eat of the following: ? Canned or prepackaged foods. ? Food that is high in trans fat, such as fried foods. ? Food that is high in saturated fat, such as fatty meat. ? Sweets, desserts, sugary drinks, and other foods with added sugar. ? Full-fat dairy products.  Do not salt foods before eating.  Try to eat at least 2 vegetarian meals each week.  Eat more home-cooked food and less restaurant, buffet, and fast food.  When eating at a restaurant, ask that your food be prepared with less salt or no salt, if possible. What foods are recommended? The items listed may not be a complete list. Talk with your dietitian about what   dietary choices are best for you. Grains Whole-grain or whole-wheat bread. Whole-grain or whole-wheat pasta. Brown rice. Oatmeal. Quinoa. Bulgur. Whole-grain and low-sodium cereals. Pita bread. Low-fat, low-sodium crackers. Whole-wheat flour tortillas. Vegetables Fresh or frozen vegetables (raw, steamed, roasted, or grilled). Low-sodium or reduced-sodium tomato and vegetable juice. Low-sodium or reduced-sodium tomato sauce and tomato paste. Low-sodium or reduced-sodium canned vegetables. Fruits All fresh, dried, or frozen fruit. Canned fruit in natural juice (without  added sugar). Meat and other protein foods Skinless chicken or turkey. Ground chicken or turkey. Pork with fat trimmed off. Fish and seafood. Egg whites. Dried beans, peas, or lentils. Unsalted nuts, nut butters, and seeds. Unsalted canned beans. Lean cuts of beef with fat trimmed off. Low-sodium, lean deli meat. Dairy Low-fat (1%) or fat-free (skim) milk. Fat-free, low-fat, or reduced-fat cheeses. Nonfat, low-sodium ricotta or cottage cheese. Low-fat or nonfat yogurt. Low-fat, low-sodium cheese. Fats and oils Soft margarine without trans fats. Vegetable oil. Low-fat, reduced-fat, or light mayonnaise and salad dressings (reduced-sodium). Canola, safflower, olive, soybean, and sunflower oils. Avocado. Seasoning and other foods Herbs. Spices. Seasoning mixes without salt. Unsalted popcorn and pretzels. Fat-free sweets. What foods are not recommended? The items listed may not be a complete list. Talk with your dietitian about what dietary choices are best for you. Grains Baked goods made with fat, such as croissants, muffins, or some breads. Dry pasta or rice meal packs. Vegetables Creamed or fried vegetables. Vegetables in a cheese sauce. Regular canned vegetables (not low-sodium or reduced-sodium). Regular canned tomato sauce and paste (not low-sodium or reduced-sodium). Regular tomato and vegetable juice (not low-sodium or reduced-sodium). Pickles. Olives. Fruits Canned fruit in a light or heavy syrup. Fried fruit. Fruit in cream or butter sauce. Meat and other protein foods Fatty cuts of meat. Ribs. Fried meat. Bacon. Sausage. Bologna and other processed lunch meats. Salami. Fatback. Hotdogs. Bratwurst. Salted nuts and seeds. Canned beans with added salt. Canned or smoked fish. Whole eggs or egg yolks. Chicken or turkey with skin. Dairy Whole or 2% milk, cream, and half-and-half. Whole or full-fat cream cheese. Whole-fat or sweetened yogurt. Full-fat cheese. Nondairy creamers. Whipped toppings.  Processed cheese and cheese spreads. Fats and oils Butter. Stick margarine. Lard. Shortening. Ghee. Bacon fat. Tropical oils, such as coconut, palm kernel, or palm oil. Seasoning and other foods Salted popcorn and pretzels. Onion salt, garlic salt, seasoned salt, table salt, and sea salt. Worcestershire sauce. Tartar sauce. Barbecue sauce. Teriyaki sauce. Soy sauce, including reduced-sodium. Steak sauce. Canned and packaged gravies. Fish sauce. Oyster sauce. Cocktail sauce. Horseradish that you find on the shelf. Ketchup. Mustard. Meat flavorings and tenderizers. Bouillon cubes. Hot sauce and Tabasco sauce. Premade or packaged marinades. Premade or packaged taco seasonings. Relishes. Regular salad dressings. Where to find more information:  National Heart, Lung, and Blood Institute: www.nhlbi.nih.gov  American Heart Association: www.heart.org Summary  The DASH eating plan is a healthy eating plan that has been shown to reduce high blood pressure (hypertension). It may also reduce your risk for type 2 diabetes, heart disease, and stroke.  With the DASH eating plan, you should limit salt (sodium) intake to 2,300 mg a day. If you have hypertension, you may need to reduce your sodium intake to 1,500 mg a day.  When on the DASH eating plan, aim to eat more fresh fruits and vegetables, whole grains, lean proteins, low-fat dairy, and heart-healthy fats.  Work with your health care provider or diet and nutrition specialist (dietitian) to adjust your eating plan to your individual   calorie needs. This information is not intended to replace advice given to you by your health care provider. Make sure you discuss any questions you have with your health care provider. Document Released: 12/01/2011 Document Revised: 12/05/2016 Document Reviewed: 12/05/2016 Elsevier Interactive Patient Education  2018 [employer] Inc[employer]  

## 2018-07-27 NOTE — Progress Notes (Signed)
Date:  07/27/2018   Name:  [name]   DOB:  [birth year]   MRN:  [id number]   Chief Complaint: Annual Exam [name] is a 57 y.o. male who presents today for his Complete Annual Exam. He feels well. He reports exercising none. He reports he is sleeping well. He is still not smoking - has gained about 20 lbs over the past 2 years but weight is stable for the past 6 months.  Hypertension  This is a chronic problem. The problem is controlled. Pertinent negatives include no chest pain, headaches, palpitations or shortness of breath. Risk factors for coronary artery disease include dyslipidemia.  Hyperlipidemia  This is a chronic problem. Recent lipid tests were reviewed and are normal. Pertinent negatives include no chest pain, myalgias or shortness of breath. Current antihyperlipidemic treatment includes statins. The current treatment provides significant improvement of lipids.  Elevated PSA - noted last visit.  He does not have any prostate sx such as frequency, nocturia, hesitancy.  Review of Systems  Constitutional: Negative for appetite change, chills, diaphoresis, fatigue and unexpected weight change.  HENT: Negative for hearing loss, tinnitus, trouble swallowing and voice change.   Eyes: Negative for visual disturbance.  Respiratory: Negative for choking, shortness of breath and wheezing.   Cardiovascular: Negative for chest pain, palpitations and leg swelling.  Gastrointestinal: Negative for abdominal pain, blood in stool, constipation and diarrhea.  Genitourinary: Negative for difficulty urinating, dysuria, frequency, hematuria and urgency.  Musculoskeletal: Negative for arthralgias, back pain and myalgias.  Skin: Negative for color change and rash.  Allergic/Immunologic: Negative for environmental allergies.  Neurological: Negative for dizziness, syncope and headaches.  Hematological: Negative for adenopathy.  Psychiatric/Behavioral: Negative for dysphoric mood and  sleep disturbance.    Patient Active Problem List   Diagnosis Date Noted  . Hyperlipidemia, mixed 01/27/2018  . Nocturia 10/09/2017  . Essential hypertension 10/09/2017    No Known Allergies  Past Surgical History:  Procedure Laterality Date  . lung collapse  2013    Social History   Tobacco Use  . Smoking status: Former Smoker    Packs/day: 0.25    Years: 40.00    Pack years: 10.00    Types: Cigarettes    Last attempt to quit: 11/25/2017    Years since quitting: 0.6  . Smokeless tobacco: Never Used  Substance Use Topics  . Alcohol use: No  . Drug use: No     Medication list has been reviewed and updated.  Current Meds  Medication Sig  . atorvastatin (LIPITOR) 10 MG tablet Take 1 tablet (10 mg total) by mouth daily.  Marland Kitchen olmesartan (BENICAR) 20 MG tablet TAKE 1 TABLET(20 MG) BY MOUTH DAILY    PHQ 2/9 Scores 07/27/2018 10/09/2017  PHQ - 2 Score 0 0    Physical Exam  Constitutional: He is oriented to person, place, and time. He appears well-developed and well-nourished.  HENT:  Head: Normocephalic.  Right Ear: Tympanic membrane, external ear and ear canal normal.  Left Ear: Tympanic membrane, external ear and ear canal normal.  Nose: Nose normal.  Mouth/Throat: Uvula is midline and oropharynx is clear and moist.  Eyes: Pupils are equal, round, and reactive to light. Conjunctivae and EOM are normal.  Neck: Normal range of motion. Neck supple. Carotid bruit is not present. No thyromegaly present.  Cardiovascular: Normal rate, regular rhythm, normal heart sounds and intact distal pulses.  Pulmonary/Chest: Effort normal and breath sounds normal. He has no wheezes. Right breast exhibits  no mass. Left breast exhibits no mass.  Abdominal: Soft. Normal appearance and bowel sounds are normal. There is no hepatosplenomegaly. There is no tenderness.  Genitourinary: Rectal exam shows external hemorrhoid (and tags). Prostate is enlarged. Prostate is not tender (and non  nodular).  Musculoskeletal: Normal range of motion.  Lymphadenopathy:    He has no cervical adenopathy.  Neurological: He is alert and oriented to person, place, and time. He has normal reflexes.  Skin: Skin is warm, dry and intact.  Psychiatric: He has a normal mood and affect. His speech is normal and behavior is normal. Judgment and thought content normal.  Nursing note and vitals reviewed.   BP 124/78 (BP Location: Right Arm, Patient Position: Sitting, Cuff Size: Normal)   Pulse (!) 59   Resp 12   Ht 5\' 5"  (1.651 m)   Wt 158 lb (71.7 kg)   SpO2 97%   BMI 26.29 kg/m   Assessment and Plan: 1. Annual physical exam Normal exam Continue healthy diet, recommend regular exercise - POCT urinalysis dipstick  2. Prostate cancer screening DRE normal - PSA  3. Essential hypertension controlled - CBC with Differential/Platelet - Comprehensive metabolic panel - olmesartan (BENICAR) 20 MG tablet; Take 1 tablet (20 mg total) by mouth daily.  Dispense: 30 tablet; Refill: 5  4. Hyperlipidemia, mixed On statin therapy - Lipid panel - atorvastatin (LIPITOR) 10 MG tablet; Take 1 tablet (10 mg total) by mouth daily.  Dispense: 30 tablet; Refill: 5  5. 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 Elevated PSA recheck  7. Need for hepatitis C screening test - Hepatitis C antibody  8. Colon cancer screening - Ambulatory referral to Gastroenterology   Meds ordered this encounter  Medications  . olmesartan (BENICAR) 20 MG tablet    Sig: Take 1 tablet (20 mg total) by mouth daily.    Dispense:  30 tablet    Refill:  5  . atorvastatin (LIPITOR) 10 MG tablet    Sig: Take 1 tablet (10 mg total) by mouth daily.    Dispense:  30 tablet    Refill:  5    Partially dictated using Editor, commissioning. Any errors are unintentional.  Halina Maidens, MD [facility]  07/27/2018

## 2018-07-28 LAB — LIPID PANEL
Chol/HDL Ratio: 4.3 ratio (ref 0.0–5.0)
Cholesterol, Total: 178 mg/dL (ref 100–199)
HDL: 41 mg/dL (ref >39)
LDL Calculated: 109 mg/dL — ABNORMAL HIGH (ref 0–99)
Triglycerides: 139 mg/dL (ref 0–149)
VLDL CHOLESTEROL CAL: 28 mg/dL (ref 5–40)

## 2018-07-28 LAB — CBC WITH DIFFERENTIAL/PLATELET
BASOS: 1 %
Basophils Absolute: 0.1 10*3/uL (ref 0.0–0.2)
EOS (ABSOLUTE): 0.2 10*3/uL (ref 0.0–0.4)
Eos: 3 %
Hematocrit: 49 % (ref 37.5–51.0)
Hemoglobin: 16.1 g/dL (ref 13.0–17.7)
IMMATURE GRANS (ABS): 0.1 10*3/uL (ref 0.0–0.1)
Immature Granulocytes: 1 %
LYMPHS ABS: 2.7 10*3/uL (ref 0.7–3.1)
LYMPHS: 31 %
MCH: 29.1 pg (ref 26.6–33.0)
MCHC: 32.9 g/dL (ref 31.5–35.7)
MCV: 88 fL (ref 79–97)
MONOS ABS: 0.7 10*3/uL (ref 0.1–0.9)
Monocytes: 8 %
NEUTROS ABS: 4.9 10*3/uL (ref 1.4–7.0)
Neutrophils: 56 %
PLATELETS: 246 10*3/uL (ref 150–450)
RBC: 5.54 x10E6/uL (ref 4.14–5.80)
RDW: 13.3 % (ref 12.3–15.4)
WBC: 8.7 10*3/uL (ref 3.4–10.8)

## 2018-07-28 LAB — COMPREHENSIVE METABOLIC PANEL
A/G RATIO: 1.5 (ref 1.2–2.2)
ALT: 35 IU/L (ref 0–44)
AST: 27 IU/L (ref 0–40)
Albumin: 4.5 g/dL (ref 3.5–5.5)
Alkaline Phosphatase: 74 IU/L (ref 39–117)
BUN/Creatinine Ratio: 13 (ref 9–20)
BUN: 13 mg/dL (ref 6–24)
Bilirubin Total: 0.6 mg/dL (ref 0.0–1.2)
CALCIUM: 9.3 mg/dL (ref 8.7–10.2)
CO2: 20 mmol/L (ref 20–29)
CREATININE: 0.98 mg/dL (ref 0.76–1.27)
Chloride: 104 mmol/L (ref 96–106)
GFR calc Af Amer: 98 mL/min/{1.73_m2} (ref >59)
GFR, EST NON AFRICAN AMERICAN: 85 mL/min/{1.73_m2} (ref >59)
Globulin, Total: 3.1 g/dL (ref 1.5–4.5)
Glucose: 104 mg/dL — ABNORMAL HIGH (ref 65–99)
POTASSIUM: 4.5 mmol/L (ref 3.5–5.2)
Sodium: 143 mmol/L (ref 134–144)
Total Protein: 7.6 g/dL (ref 6.0–8.5)

## 2018-07-28 LAB — PSA: Prostate Specific Ag, Serum: 3.5 ng/mL (ref 0.0–4.0)

## 2018-07-28 LAB — HEPATITIS C ANTIBODY

## 2018-08-03 ENCOUNTER — Encounter: Payer: Managed Care, Other (non HMO) | Admitting: Internal Medicine

## 2018-08-09 ENCOUNTER — Encounter: Payer: Self-pay | Admitting: *Deleted

## 2019-01-28 ENCOUNTER — Encounter: Payer: Self-pay | Admitting: Internal Medicine

## 2019-01-28 ENCOUNTER — Other Ambulatory Visit: Payer: Self-pay

## 2019-01-28 ENCOUNTER — Ambulatory Visit: Payer: Managed Care, Other (non HMO) | Admitting: Internal Medicine

## 2019-01-28 VITALS — BP 114/70 | HR 67 | Ht 65.0 in | Wt 167.0 lb

## 2019-01-28 DIAGNOSIS — I10 Essential (primary) hypertension: Secondary | ICD-10-CM

## 2019-01-28 DIAGNOSIS — K219 Gastro-esophageal reflux disease without esophagitis: Secondary | ICD-10-CM | POA: Insufficient documentation

## 2019-01-28 DIAGNOSIS — E782 Mixed hyperlipidemia: Secondary | ICD-10-CM | POA: Diagnosis not present

## 2019-01-28 MED ORDER — OLMESARTAN MEDOXOMIL 20 MG PO TABS: 20.0000 mg | ORAL_TABLET | Freq: Every day | ORAL | 1 refills | Status: DC

## 2019-01-28 MED ORDER — ATORVASTATIN CALCIUM 10 MG PO TABS: 10.0000 mg | ORAL_TABLET | Freq: Every day | ORAL | 1 refills | Status: DC

## 2019-01-28 MED ORDER — OMEPRAZOLE 20 MG PO CPDR: 20.0000 mg | DELAYED_RELEASE_CAPSULE | Freq: Every day | ORAL | 1 refills | Status: DC

## 2019-01-28 NOTE — Progress Notes (Signed)
Date:  01/28/2019   Name:  [name]   DOB:  1961-12-04   MRN:  [id number]   Chief Complaint: Diabetes and Hypertension  Hypertension  This is a chronic problem. The problem is controlled. Pertinent negatives include no chest pain, headaches, palpitations or shortness of breath. Past treatments include angiotensin blockers. The current treatment provides significant improvement.  Hyperlipidemia  This is a chronic problem. Pertinent negatives include no chest pain or shortness of breath. Current antihyperlipidemic treatment includes statins. The current treatment provides significant improvement of lipids.  Gastroesophageal Reflux  He complains of heartburn. He reports no abdominal pain, no chest pain, no coughing, no sore throat or no wheezing. This is a new problem. The problem occurs frequently. The heartburn is located in the substernum. The heartburn is of moderate intensity. Pertinent negatives include no fatigue. He has tried a PPI for the symptoms. The treatment provided significant relief.    Review of Systems  Constitutional: Negative for appetite change, fatigue and unexpected weight change.  HENT: Negative for sore throat and voice change.   Eyes: Negative for visual disturbance.  Respiratory: Negative for cough, shortness of breath and wheezing.   Cardiovascular: Negative for chest pain, palpitations and leg swelling.  Gastrointestinal: Positive for heartburn. Negative for abdominal pain, blood in stool and vomiting.  Genitourinary: Negative for dysuria and hematuria.  Skin: Negative for color change and rash.  Allergic/Immunologic: Negative for environmental allergies.  Neurological: Negative for tremors, numbness and headaches.  Psychiatric/Behavioral: Negative for dysphoric mood and sleep disturbance.    Patient Active Problem List   Diagnosis Date Noted  . 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 Elevated PSA 07/27/2018  . Hyperlipidemia, mixed 01/27/2018  .  Nocturia 10/09/2017  . Essential hypertension 10/09/2017    No Known Allergies  Past Surgical History:  Procedure Laterality Date  . lung collapse  2013    Social History   Tobacco Use  . Smoking status: Former Smoker    Packs/day: 0.25    Years: 40.00    Pack years: 10.00    Types: Cigarettes    Last attempt to quit: 11/25/2017    Years since quitting: 1.1  . Smokeless tobacco: Never Used  Substance Use Topics  . Alcohol use: No  . Drug use: No     Medication list has been reviewed and updated.  Current Meds  Medication Sig  . atorvastatin (LIPITOR) 10 MG tablet Take 1 tablet (10 mg total) by mouth daily.  Marland Kitchen olmesartan (BENICAR) 20 MG tablet Take 1 tablet (20 mg total) by mouth daily.    PHQ 2/9 Scores 01/28/2019 07/27/2018 10/09/2017  PHQ - 2 Score 0 0 0    Physical Exam Vitals signs and nursing note reviewed.  Constitutional:      General: He is not in acute distress.    Appearance: He is well-developed.  HENT:     Head: Normocephalic and atraumatic.     Mouth/Throat:     Mouth: Mucous membranes are moist.  Eyes:     Pupils: Pupils are equal, round, and reactive to light.  Neck:     Musculoskeletal: Normal range of motion and neck supple.     Vascular: No carotid bruit.  Cardiovascular:     Rate and Rhythm: Normal rate and regular rhythm.     Pulses: Normal pulses.  Pulmonary:     Effort: Pulmonary effort is normal. No respiratory distress.     Breath sounds: Normal breath sounds.  Abdominal:  General: Bowel sounds are normal.     Palpations: Abdomen is soft.     Tenderness: There is no abdominal tenderness.  Lymphadenopathy:     Cervical: No cervical adenopathy.  Skin:    General: Skin is warm and dry.     Findings: No rash.  Neurological:     Mental Status: He is alert and oriented to person, place, and time.  Psychiatric:        Behavior: Behavior normal.        Thought Content: Thought content normal.     BP 114/70   Pulse 67   Ht 5'  5" (1.651 m)   Wt 167 lb (75.8 kg)   SpO2 97%   BMI 27.79 kg/m   Assessment and Plan: 1. Hyperlipidemia, mixed On statin therapy - atorvastatin (LIPITOR) 10 MG tablet; Take 1 tablet (10 mg total) by mouth daily.  Dispense: 90 tablet; Refill: 1  2. Essential hypertension controlled - olmesartan (BENICAR) 20 MG tablet; Take 1 tablet (20 mg total) by mouth daily.  Dispense: 90 tablet; Refill: 1  3. Gastroesophageal reflux disease, esophagitis presence not specified Begin omeprazole daily - omeprazole (PRILOSEC) 20 MG capsule; Take 1 capsule (20 mg total) by mouth daily.  Dispense: 90 capsule; Refill: 1   Partially dictated using Editor, commissioning. Any errors are unintentional.  Halina Maidens, MD [facility]  01/28/2019

## 2019-02-11 ENCOUNTER — Other Ambulatory Visit: Payer: Self-pay | Admitting: Internal Medicine

## 2019-02-11 DIAGNOSIS — I10 Essential (primary) hypertension: Secondary | ICD-10-CM

## 2019-04-04 ENCOUNTER — Other Ambulatory Visit: Payer: Self-pay | Admitting: Internal Medicine

## 2019-04-04 DIAGNOSIS — E782 Mixed hyperlipidemia: Secondary | ICD-10-CM

## 2019-07-21 ENCOUNTER — Other Ambulatory Visit: Payer: Self-pay | Admitting: Internal Medicine

## 2019-07-21 DIAGNOSIS — K219 Gastro-esophageal reflux disease without esophagitis: Secondary | ICD-10-CM

## 2019-10-07 ENCOUNTER — Other Ambulatory Visit: Payer: Self-pay | Admitting: Internal Medicine

## 2019-10-07 DIAGNOSIS — E782 Mixed hyperlipidemia: Secondary | ICD-10-CM

## 2019-10-16 ENCOUNTER — Encounter: Payer: Self-pay | Admitting: Internal Medicine

## 2019-10-16 ENCOUNTER — Telehealth: Payer: Self-pay | Admitting: *Deleted

## 2019-10-16 ENCOUNTER — Other Ambulatory Visit: Payer: Self-pay

## 2019-10-16 ENCOUNTER — Ambulatory Visit (INDEPENDENT_AMBULATORY_CARE_PROVIDER_SITE_OTHER): Payer: Managed Care, Other (non HMO) | Admitting: Internal Medicine

## 2019-10-16 VITALS — BP 128/78 | HR 60 | Ht 66.0 in | Wt 164.0 lb

## 2019-10-16 DIAGNOSIS — Z0000 Encounter for general adult medical examination without abnormal findings: Secondary | ICD-10-CM | POA: Diagnosis not present

## 2019-10-16 DIAGNOSIS — Z1211 Encounter for screening for malignant neoplasm of colon: Secondary | ICD-10-CM

## 2019-10-16 DIAGNOSIS — I10 Essential (primary) hypertension: Secondary | ICD-10-CM

## 2019-10-16 DIAGNOSIS — K219 Gastro-esophageal reflux disease without esophagitis: Secondary | ICD-10-CM | POA: Diagnosis not present

## 2019-10-16 DIAGNOSIS — F17201 Nicotine dependence, unspecified, in remission: Secondary | ICD-10-CM

## 2019-10-16 DIAGNOSIS — R972 Elevated prostate specific antigen [PSA]: Secondary | ICD-10-CM | POA: Diagnosis not present

## 2019-10-16 DIAGNOSIS — E782 Mixed hyperlipidemia: Secondary | ICD-10-CM

## 2019-10-16 LAB — POCT URINALYSIS DIPSTICK
Bilirubin, UA: NEGATIVE
Blood, UA: NEGATIVE
Glucose, UA: NEGATIVE
Ketones, UA: NEGATIVE
Leukocytes, UA: NEGATIVE
Nitrite, UA: NEGATIVE
Protein, UA: NEGATIVE
Spec Grav, UA: 1.015 (ref 1.010–1.025)
Urobilinogen, UA: 0.2 E.U./dL
pH, UA: 5 (ref 5.0–8.0)

## 2019-10-16 MED ORDER — OLMESARTAN MEDOXOMIL 20 MG PO TABS: 20.0000 mg | ORAL_TABLET | Freq: Every day | ORAL | 12 refills | Status: DC

## 2019-10-16 NOTE — Telephone Encounter (Signed)
Received referral for low dose lung cancer screening CT scan. With assistance of interpreter, Message left at phone number listed in EMR for patient to call me back to facilitate scheduling scan.

## 2019-10-16 NOTE — Progress Notes (Signed)
Date:  10/16/2019   Name:  BRAYLYNN MEDVEC   DOB:  December 10, 1961   MRN:  [id number]   Chief Complaint: Annual Exam ZAVEON NOLTON is a 58 y.o. male who presents today for his Complete Annual Exam. He feels well. He reports exercising has physical job.Marland Kitchen He reports he is sleeping fairly well. He has never had a colonoscopy and is not interested.  He agrees to FIT testing.    Colonoscopy - none LDCT - none  Hypertension This is a chronic problem. The problem is controlled. Pertinent negatives include no blurred vision, chest pain, headaches, orthopnea, palpitations, peripheral edema or shortness of breath. Past treatments include angiotensin blockers. The current treatment provides significant improvement.  Gastroesophageal Reflux He complains of heartburn. He reports no abdominal pain, no chest pain, no choking or no wheezing. This is a chronic problem. The problem occurs rarely. Pertinent negatives include no fatigue. He has tried a PPI for the symptoms. The treatment provided significant relief.  Elevated PSA - this has been borderline in the past.  He denies any urinary issues - no bleeding, hesitancy, weak stream, straining to urinate or nocturia > 1. Last PSA:  3.5, 4.5  Lab Results  Component Value Date   CREATININE 0.98 07/27/2018   BUN 13 07/27/2018   NA 143 07/27/2018   K 4.5 07/27/2018   CL 104 07/27/2018   CO2 20 07/27/2018   Lab Results  Component Value Date   CHOL 178 07/27/2018   HDL 41 07/27/2018   LDLCALC 109 (H) 07/27/2018   TRIG 139 07/27/2018   CHOLHDL 4.3 07/27/2018      Review of Systems  Constitutional: Negative for appetite change, chills, diaphoresis, fatigue and unexpected weight change.  HENT: Positive for ear pain. Negative for hearing loss, tinnitus, trouble swallowing and voice change.   Eyes: Negative for blurred vision and visual disturbance.  Respiratory: Negative for choking, shortness of breath and wheezing.   Cardiovascular: Negative for  chest pain, palpitations, orthopnea and leg swelling.  Gastrointestinal: Positive for heartburn. Negative for abdominal distention, abdominal pain, anal bleeding, blood in stool, constipation and diarrhea.  Genitourinary: Negative for difficulty urinating, dysuria and frequency.  Musculoskeletal: Negative for arthralgias, back pain and myalgias.  Skin: Negative for color change and rash.  Allergic/Immunologic: Negative for environmental allergies.  Neurological: Negative for dizziness, syncope, light-headedness and headaches.  Hematological: Negative for adenopathy.  Psychiatric/Behavioral: Negative for dysphoric mood and sleep disturbance. The patient is not nervous/anxious.     Patient Active Problem List   Diagnosis Date Noted  . Gastroesophageal reflux disease 01/28/2019  . 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 Elevated PSA 07/27/2018  . Hyperlipidemia, mixed 01/27/2018  . Nocturia 10/09/2017  . Essential hypertension 10/09/2017    No Known Allergies  Past Surgical History:  Procedure Laterality Date  . lung collapse  2013    Social History   Tobacco Use  . Smoking status: Former Smoker    Packs/day: 0.25    Years: 40.00    Pack years: 10.00    Types: Cigarettes    Quit date: 11/25/2017    Years since quitting: 1.8  . Smokeless tobacco: Never Used  Substance Use Topics  . Alcohol use: No  . Drug use: No     Medication list has been reviewed and updated.  Current Meds  Medication Sig  . atorvastatin (LIPITOR) 10 MG tablet TAKE 1 TABLET(10 MG) BY MOUTH DAILY  . olmesartan (BENICAR) 20 MG tablet TAKE 1 TABLET(20  MG) BY MOUTH DAILY  . omeprazole (PRILOSEC) 20 MG capsule TAKE 1 CAPSULE(20 MG) BY MOUTH DAILY    PHQ 2/9 Scores 10/16/2019 01/28/2019 07/27/2018 10/09/2017  PHQ - 2 Score 0 0 0 0    BP Readings from Last 3 Encounters:  10/16/19 128/78  01/28/19 114/70  07/27/18 124/78    Physical Exam Vitals signs and nursing note reviewed.  Constitutional:       Appearance: Normal appearance. He is well-developed.  HENT:     Head: Normocephalic.     Right Ear: Tympanic membrane, ear canal and external ear normal.     Left Ear: Tympanic membrane and external ear normal.     Ears:     Comments: Abrasion of left posterior ear canal    Nose: Nose normal.     Mouth/Throat:     Pharynx: Uvula midline.  Eyes:     Conjunctiva/sclera: Conjunctivae normal.     Pupils: Pupils are equal, round, and reactive to light.  Neck:     Musculoskeletal: Normal range of motion and neck supple.     Thyroid: No thyromegaly.     Vascular: No carotid bruit.  Cardiovascular:     Rate and Rhythm: Normal rate and regular rhythm.     Heart sounds: Normal heart sounds.  Pulmonary:     Effort: Pulmonary effort is normal.     Breath sounds: Normal breath sounds. No wheezing.  Chest:     Breasts:        Right: No mass.        Left: No mass.  Abdominal:     General: Bowel sounds are normal.     Palpations: Abdomen is soft.     Tenderness: There is no abdominal tenderness.  Genitourinary:    Comments: Exam deferred Musculoskeletal: Normal range of motion.     Right lower leg: No edema.     Left lower leg: No edema.  Lymphadenopathy:     Cervical: No cervical adenopathy.  Skin:    General: Skin is warm and dry.     Capillary Refill: Capillary refill takes less than 2 seconds.  Neurological:     General: No focal deficit present.     Mental Status: He is alert and oriented to person, place, and time.     Deep Tendon Reflexes: Reflexes are normal and symmetric.  Psychiatric:        Attention and Perception: Attention normal.        Mood and Affect: Mood normal.        Speech: Speech normal.        Behavior: Behavior normal.        Thought Content: Thought content normal.        Judgment: Judgment normal.     Wt Readings from Last 3 Encounters:  10/16/19 164 lb (74.4 kg)  01/28/19 167 lb (75.8 kg)  07/27/18 158 lb (71.7 kg)    BP 128/78   Pulse 60    Ht 5\' 6"  (1.676 m)   Wt 164 lb (74.4 kg)   SpO2 97%   BMI 26.47 kg/m   Assessment and Plan: 1. Annual physical exam Normal exam except for weight Pt advised not to use any instruments in his ears - POCT urinalysis dipstick  2. Essential hypertension Clinically stable exam with well controlled BP.   Tolerating medications, benicar 20 mg, without side effects at this time. Pt to continue current regimen and low sodium diet; benefits of regular exercise as able  discussed. - Comprehensive metabolic panel - olmesartan (BENICAR) 20 MG tablet; Take 1 tablet (20 mg total) by mouth daily.  Dispense: 30 tablet; Refill: 12  3. Gastroesophageal reflux disease, unspecified whether esophagitis present Symptoms well controlled on daily PPI No red flag signs such as weight loss, n/v, melena Will continue omeprazole 20 mg. - CBC with Differential/Platelet  4. Hyperlipidemia, mixed Tolerating high intensity statin therapy - lipitor 10 mg daily - Lipid panel  5. Elevated PSA DRE deferred - PSA  6. Colon cancer screening Pt declines colonoscopy -  - Fecal occult blood, imunochemical  7. Tobacco use disorder, severe, in sustained remission Qualifies for LDCT screening - will refer   Partially dictated using Dragon software. Any errors are unintentional.  Halina Maidens, MD [facility]  10/16/2019

## 2019-10-16 NOTE — Patient Instructions (Signed)
Someone will call you about lung cancer screening CT scan.

## 2019-10-17 LAB — CBC WITH DIFFERENTIAL/PLATELET
Basophils Absolute: 0.1 10*3/uL (ref 0.0–0.2)
Basos: 1 %
EOS (ABSOLUTE): 0.3 10*3/uL (ref 0.0–0.4)
Eos: 3 %
Hematocrit: 49.2 % (ref 37.5–51.0)
Hemoglobin: 16.3 g/dL (ref 13.0–17.7)
Immature Grans (Abs): 0.1 10*3/uL (ref 0.0–0.1)
Immature Granulocytes: 1 %
Lymphocytes Absolute: 3.2 10*3/uL — ABNORMAL HIGH (ref 0.7–3.1)
Lymphs: 38 %
MCH: 30 pg (ref 26.6–33.0)
MCHC: 33.1 g/dL (ref 31.5–35.7)
MCV: 90 fL (ref 79–97)
Monocytes Absolute: 0.7 10*3/uL (ref 0.1–0.9)
Monocytes: 9 %
Neutrophils Absolute: 4.1 10*3/uL (ref 1.4–7.0)
Neutrophils: 48 %
Platelets: 242 10*3/uL (ref 150–450)
RBC: 5.44 x10E6/uL (ref 4.14–5.80)
RDW: 13.1 % (ref 11.6–15.4)
WBC: 8.4 10*3/uL (ref 3.4–10.8)

## 2019-10-17 LAB — COMPREHENSIVE METABOLIC PANEL
ALT: 37 IU/L (ref 0–44)
AST: 24 IU/L (ref 0–40)
Albumin/Globulin Ratio: 1.4 (ref 1.2–2.2)
Albumin: 4.3 g/dL (ref 3.8–4.9)
Alkaline Phosphatase: 73 IU/L (ref 39–117)
BUN/Creatinine Ratio: 13 (ref 10–24)
BUN: 14 mg/dL (ref 8–27)
Bilirubin Total: 0.3 mg/dL (ref 0.0–1.2)
CO2: 25 mmol/L (ref 20–29)
Calcium: 9.4 mg/dL (ref 8.6–10.2)
Chloride: 102 mmol/L (ref 96–106)
Creatinine, Ser: 1.12 mg/dL (ref 0.76–1.27)
GFR calc Af Amer: 82 mL/min/{1.73_m2} (ref >59)
GFR calc non Af Amer: 71 mL/min/{1.73_m2} (ref >59)
Globulin, Total: 3 g/dL (ref 1.5–4.5)
Glucose: 98 mg/dL (ref 65–99)
Potassium: 4.8 mmol/L (ref 3.5–5.2)
Sodium: 141 mmol/L (ref 134–144)
Total Protein: 7.3 g/dL (ref 6.0–8.5)

## 2019-10-17 LAB — LIPID PANEL
Chol/HDL Ratio: 4.6 ratio (ref 0.0–5.0)
Cholesterol, Total: 176 mg/dL (ref 100–199)
HDL: 38 mg/dL — ABNORMAL LOW (ref >39)
LDL Chol Calc (NIH): 115 mg/dL — ABNORMAL HIGH (ref 0–99)
Triglycerides: 125 mg/dL (ref 0–149)
VLDL Cholesterol Cal: 23 mg/dL (ref 5–40)

## 2019-10-17 LAB — PSA: Prostate Specific Ag, Serum: 4.2 ng/mL — ABNORMAL HIGH (ref 0.0–4.0)

## 2019-10-25 ENCOUNTER — Other Ambulatory Visit: Payer: Self-pay

## 2019-10-25 DIAGNOSIS — R972 Elevated prostate specific antigen [PSA]: Secondary | ICD-10-CM

## 2019-10-25 NOTE — Progress Notes (Signed)
psa

## 2019-11-13 ENCOUNTER — Telehealth: Payer: Self-pay | Admitting: *Deleted

## 2019-11-13 NOTE — Telephone Encounter (Signed)
Received referral for low dose lung cancer screening CT scan. Message left at phone number listed in EMR for patient to call me back to facilitate scheduling scan. (done with assist of interpreter services)

## 2020-02-07 LAB — FECAL OCCULT BLOOD, IMMUNOCHEMICAL: Fecal Occult Bld: NEGATIVE

## 2020-02-21 ENCOUNTER — Other Ambulatory Visit: Payer: Self-pay | Admitting: Internal Medicine

## 2020-02-21 DIAGNOSIS — K219 Gastro-esophageal reflux disease without esophagitis: Secondary | ICD-10-CM

## 2020-03-24 ENCOUNTER — Other Ambulatory Visit: Payer: Self-pay | Admitting: Internal Medicine

## 2020-03-24 DIAGNOSIS — E782 Mixed hyperlipidemia: Secondary | ICD-10-CM

## 2020-06-22 ENCOUNTER — Other Ambulatory Visit: Payer: Self-pay

## 2020-06-22 DIAGNOSIS — R972 Elevated prostate specific antigen [PSA]: Secondary | ICD-10-CM

## 2020-06-23 LAB — PSA: Prostate Specific Ag, Serum: 6.3 ng/mL — ABNORMAL HIGH (ref 0.0–4.0)

## 2020-08-03 ENCOUNTER — Other Ambulatory Visit: Payer: Self-pay

## 2020-08-03 ENCOUNTER — Telehealth: Payer: Self-pay | Admitting: Internal Medicine

## 2020-08-03 DIAGNOSIS — R972 Elevated prostate specific antigen [PSA]: Secondary | ICD-10-CM

## 2020-08-03 NOTE — Telephone Encounter (Signed)
It is for Urology for elevated PSA.

## 2020-08-03 NOTE — Telephone Encounter (Unsigned)
Copied from [place] [id number]. Topic: Referral - Status >> Aug 03, 2020  1:09 PM Sheran Luz wrote: Patient's wife states that a referral for neurology was supposed to be sent in but has not heard anything.

## 2020-08-03 NOTE — Telephone Encounter (Signed)
Patient never answered the phone when we called to discuss his elevated PSA. The referral was for Urology per Dr Army Melia. Placed this referral for the patient to Urology today since we have now heard back from the wife.

## 2020-08-06 ENCOUNTER — Telehealth: Payer: Self-pay | Admitting: Internal Medicine

## 2020-08-06 NOTE — Telephone Encounter (Signed)
Called pts wife told her that a RF has been placed. Told her if pt doesn't get a call by Monday 08/10/2020 to give [place] a call back. Pts wife verbalized understanding.  KP

## 2020-08-06 NOTE — Telephone Encounter (Unsigned)
Copied from [place] [id number]. Topic: General - Call Back - No Documentation >> Aug 06, 2020 10:36 AM Erick Blinks wrote: Pt's wife called requesting to speak with nurse regarding patient. She has questions regarding a referral for neurology.  Best contact: [phone]

## 2020-08-07 ENCOUNTER — Encounter: Payer: Self-pay | Admitting: Urology

## 2020-08-25 ENCOUNTER — Ambulatory Visit: Payer: Managed Care, Other (non HMO) | Admitting: Urology

## 2020-08-25 ENCOUNTER — Other Ambulatory Visit: Payer: Self-pay

## 2020-08-25 ENCOUNTER — Encounter: Payer: Self-pay | Admitting: Urology

## 2020-08-25 VITALS — BP 151/80 | HR 58 | Ht 66.0 in | Wt 161.0 lb

## 2020-08-25 DIAGNOSIS — R972 Elevated prostate specific antigen [PSA]: Secondary | ICD-10-CM

## 2020-08-25 DIAGNOSIS — Z125 Encounter for screening for malignant neoplasm of prostate: Secondary | ICD-10-CM

## 2020-08-25 NOTE — Patient Instructions (Signed)
Prueba de deteccin del cncer de prstata Prostate Cancer Screening  La prueba de deteccin del cncer de prstata es una prueba que se realiza para detectar la presencia de cncer de prstata en los hombres. La prstata es una glndula del tamao de una nuez que se encuentra debajo de la vejiga y delante del recto en los hombres. La funcin de la prstata es agregar lquido en el semen Holiday representative. El cncer de prstata es el segundo tipo de cncer ms [employer]. Quin debe realizarse estudios para la deteccin de cncer de prstata?  Las recomendaciones para la prueba de deteccin varan segn la edad y otros factores de [place]. Se recomienda realizar la prueba de deteccin si:  Es mayor de 30aos de edad. Si usted tiene entre 12 y 69, hable con el mdico sobre la necesidad de realizarse una prueba de deteccin y qu frecuencia debe Radiation protection practitioner. Debido a que la [employer] de los cnceres de prstata crecen lentamente y no causan la muerte, generalmente la prueba de deteccin se reserva en este grupo etario para los hombres que tienen una expectativa de vida de entre 10 y 47 aos.  Es Garment/textile technologist de 79 aos y tiene estos factores de riesgo: ? Ser un hombre negro o un hombre de ascendencia africana. ? Tener un padre, un hermano o un to que han sido diagnosticados con cncer de prstata. El riesgo es mayor si su familiar tuvo cncer a una edad temprana. No se recomienda realizar la prueba de deteccin si:  Es Garment/textile technologist de 00BBC.  Tiene entre 34 y 66 aos y no tiene factores de Sales executive.  Tiene 70 aos o ms. A esta edad, los riesgos que puede causar la prueba de deteccin son [employer] los beneficios que puede proporcionar. Si tiene Public affairs consultant de cncer de prstata, el mdico puede recomendarle que se realice pruebas de deteccin con ms frecuencia o que comience a realizrselas a una edad ms temprana. Cmo se realiza la prueba de deteccin del cncer de prstata? La prueba de  deteccin de cncer de prstata recomendada es un anlisis de sangre llamada prueba del antgeno prosttico especfico (prostate-specific antigen, PSA). El PSA es una protena que se produce en la prstata. A medida que usted envejece, la prstata produce naturalmente ms PSA. Los niveles muy altos de PSA pueden deberse a lo siguiente:  Cncer de prstata.  El agrandamiento de la prstata no causado por el cncer (hiperplasia prosttica benigna, HPB). Esta afeccin es muy frecuente en hombres de edad avanzada.  Una infeccin de la prstata (prostatitis). Segn los [employer] del PSA, es posible que necesite someterse a ms pruebas, por ejemplo:  Un examen fsico para verificar el tamao de la prstata.  Anlisis de [place] y pruebas de diagnstico por imgenes.  Un procedimiento para extraer muestras de tejido de la prstata para analizarlas (biopsia). Cules son los beneficios de la prueba de deteccin de cncer de prstata?  La prueba de deteccin puede ayudar a Visual merchandiser en una etapa temprana, antes de que comiencen los sntomas y cuando se puede tratar con mayor facilidad.  Hay una probabilidad pequea de que la prueba de deteccin pueda reducir el riesgo de morir a causa del cncer de prstata. La posibilidad es pequea porque el cncer de prstata es un cncer de crecimiento lento y la mayora de los hombres con cncer de prstata fallecen debido a otra causa. Cules son los riesgos de la prueba de Programme researcher, broadcasting/film/video de cncer de prstata? El  riesgo principal de la prueba de deteccin de cncer de prstata es el diagnstico y el tratamiento de un cncer de prstata que nunca habra causado ningn sntoma ni problema. Esto se denomina sobrediagnsticoy sobretratamiento. La prueba de deteccin del PSA no puede indicarle si su PSA es alto debido al cncer o a otra causa. Una biopsia de prstata es el nico procedimiento para Electrical engineer de prstata. Es posible que incluso los  resultados de una biopsia no puedan indicarle si el cncer se debe tratar. Es posible que el cncer de prstata de crecimiento lento no necesite ningn tratamiento excepto el control; por lo tanto el diagnstico y Dispensing optician pueden provocar un estrs innecesario u otros efectos secundarios. Una biopsia de prstata tambin puede causar:  Infeccin o fiebre.  Un resultado negativo falso. Este es un resultado que muestra que usted no tiene cncer de prstata cuando en realidad lo tiene. Preguntas para hacerle al mdico  Cundo debo comenzar a realizarme pruebas de deteccin del cncer de prstata?  Cul es mi riesgo de Best boy cncer de prstata?  Con qu frecuencia tengo que realizarme pruebas de deteccin?  Qu tipos de pruebas de deteccin necesito?  Cmo obtengo los resultados de la prueba?  [place] significan los resultados?  Necesito tratamiento? Dnde buscar ms informacin  American Cancer Society ([place] contra el Cncer): www.cancer.org  American Urological Association (Asociacin [place]): www.auanet.org Comunquese con un mdico si:  Tiene dificultad para orinar.  Siente dolor al [place] o al [employer].  Observa sangre en la orina o en el semen.  Siente dolor en la espalda o en la zona de la prstata. Resumen  El cncer de prstata es un tipo comn de cncer en los hombres. La prstata se encuentra debajo de la vejiga y delante del recto. Esta glndula agrega lquido en el semen durante la eyaculacin.  Una prueba de deteccin de cncer de prstata puede ayudar a identificar el cncer en una etapa temprana, cuando se puede tratar con ms facilidad.  La prueba del antgeno prosttico especfico (PSA) es una prueba de deteccin del cncer de prstata.  Analice los riesgos y beneficios de las pruebas de Programme researcher, broadcasting/film/video del cncer de prstata con el mdico. Si tiene 61 aos o ms, los riesgos que puede causar la prueba de deteccin son [employer] los beneficios que puede proporcionar. Esta informacin no tiene Marine scientist el consejo del mdico. Asegrese de hacerle al mdico cualquier pregunta que tenga. Document Revised: 08/29/2019 Document Reviewed: 08/29/2019 Elsevier Patient Education  [place] de prstata guiada por ecografa transrectal Transrectal Ultrasound-Guided Prostate Biopsy La biopsia de la prstata guiada por ultrasonido transrectal es un procedimiento para tomar muestras de tejido de la prstata para examinar. El procedimiento [place] imgenes de ultrasonido para guiar el proceso de tomar muestras de tejido. Se toman muestras de tejido para examinar en un laboratorio y Hydrographic surveyor si hay cncer de prstata. Generalmente, este procedimiento se realiza para Mudlogger prstata de los hombres que tienen niveles altos (elevados) de antgeno prosttico especfico (APE) que puede ser un signo de cncer de prstata. Informe al pediatra del nio acerca de lo siguiente:  Cualquier alergia que tenga.  Todos los [employer], incluidos vitaminas, hierbas, gotas oftlmicas, cremas y medicamentos de venta libre.  Cualquier problema previo que usted o algn miembro de su familia haya tenido con los anestsicos.  Cualquier trastorno de la sangre que tenga.  Cirugas a las que se someti.  Cualquier afeccin mdica que tenga. Cules son los riesgos? En general, se trata de un procedimiento seguro. Sin embargo, pueden ocurrir complicaciones, por ejemplo:  Infeccin prosttica.  Sangrado que proviene del recto.  Presencia de [employer].  Reacciones alrgicas a los medicamentos.  Dao a las estructuras circundantes, como vasos sanguneos, rganos o msculos.  Tener dificultad para Garment/textile technologist.  Dao nervioso. Esto suele ser transitorio.  Dolor. Qu ocurre antes del procedimiento? Cmo mantener al nio hidratado Siga las instrucciones del mdico acerca de mantenerse  hidratado, las cuales pueden incluir lo siguiente:  Hasta 2horas antes del procedimiento, puede beber lquidos transparentes, como agua, jugos de fruta sin pulpa, caf negro y t solo.  Restricciones en las comidas y bebidas Siga las indicaciones del mdico respecto de las comidas y bebidas, las cuales pueden incluir lo siguiente:  8 horas antes del procedimiento, deje de ingerir comidas o alimentos pesados, por ejemplo, carne, alimentos fritos o alimentos grasos.  6 horas antes del procedimiento, deje de ingerir comidas o alimentos livianos, como tostadas o cereales.  6 horas antes del procedimiento, deje de tomar leche o bebidas que [employer].  2 horas antes del procedimiento, deje de beber lquidos claros. Medicamentos Consulte al mdico sobre:  Quarry manager o suspender los medicamentos que toma habitualmente. Esto es especialmente importante si toma medicamentos para la diabetes o anticoagulantes.  Tome medicamentos de [place], vitaminas, hierbas y suplementos.  Darle al nio medicamentos como aspirina e ibuprofeno. Estos medicamentos pueden tener un efecto anticoagulante en la [place]. No tome estos medicamentos a menos que el mdico se lo indique. Indicaciones generales  Pueden darle un antibitico para ayudar a prevenir infecciones. Si es as, Development worker, community se lo haya indicado el mdico.  Le administrarn un enema. Durante un enema, se inyecta lquido en el recto para que se eliminen los desechos.  Pueden extraerle Truddie Coco de [place] o pedirle [place] de [place].  Haga que alguien lo lleve a su casa desde el hospital o la clnica. Qu ocurre durante el procedimiento?   Para reducir el riesgo de infecciones: ? El equipo mdico se lavar o se desinfectar las manos. ? Pueden rasurar la zona [place]. ? Le limpiarn la piel con un jabn antisptico. ? Pueden administrarle antibiticos.  Le colocarn una va intravenosa en una vena.  Le  administrarn uno de los siguientes medicamentos o ambos: ? Un medicamento para ayudar a relajarse (sedante). ? Un medicamento para adormecer la zona (anestesia local).  Lo colocarn acostado sobre el costado izquierdo y las rodillas flexionadas hacia el pecho.  Se introducir una sonda con gel lubricante en el recto y se tomarn imgenes de la prstata y las estructuras circundantes.  Le inyectarn un anestsico en la prstata.  Se introducir una aguja para biopsia por el recto que se guiar [employer] prstata mediante el uso de imgenes de [place].  Se tomarn muestras del tejido de la prstata y se retirar la aguja.  Las muestras para la biopsia se enviarn a un laboratorio para ser [employer]. El procedimiento puede variar segn el mdico y el hospital. Sander Nephew ocurre despus del procedimiento?  Le controlarn la presin arterial, la frecuencia cardaca, la frecuencia respiratoria y Retail buyer de oxgeno en la sangre hasta que haya desaparecido el efecto de los medicamentos administrados.  Tal vez tenga ciertas molestias en el rea rectal. Le darn analgsicos si los necesita.  No conduzca durante 24horas si le administraron un sedante. Resumen  Una biopsia transrectal  guiada por ultrasonido toma muestras de tejido de la prstata.  Generalmente, este procedimiento se realiza para Mudlogger prstata de los hombres que tienen niveles altos (elevados) de antgeno prosttico especfico (APE) que puede ser un signo de cncer de prstata.  Despus del procedimiento, es posible que tenga ciertas molestias en el rea rectal.  Haga que alguien lo lleve a su casa desde el hospital o la clnica. Esta informacin no tiene Marine scientist el consejo del mdico. Asegrese de hacerle al mdico cualquier pregunta que tenga. Document Revised: 06/21/2018 Document Reviewed: 09/21/2017 Elsevier Patient Education  2020 [employer].

## 2020-08-25 NOTE — Progress Notes (Signed)
   08/25/20 2:26 PM   [name] 1961/06/26 [id number]  CC: Elevated PSA  HPI: I saw [name] in urology clinic for evaluation of elevated PSA.  The majority of the history today is obtained from his wife.  He is a healthy 59 year old male with a family history of lethal prostate cancer in his father(80s at time of death) who presents with an elevated PSA of 6.3 on 06/22/2020.  This was increased from 4.2 in October 2020, and 3.5 of August 2019.  PSA was 4.5 in October 2018.  He has never undergone prostate biopsy or further evaluation.  He denies any gross hematuria or urinary symptoms.   PMH: Past Medical History:  Diagnosis Date  . Hypertension   . Tobacco use disorder 10/09/2017    Surgical History: Past Surgical History:  Procedure Laterality Date  . lung collapse  2013   Family History: Family History  Problem Relation Age of Onset  . Prostate cancer Father 37  . Heart attack Sister   . Diabetes Brother     Social History:  reports that he quit smoking about 2 years ago. His smoking use included cigarettes. He has a 10.00 pack-year smoking history. He has never used smokeless tobacco. He reports that he does not drink alcohol and does not use drugs.  Physical Exam: BP (!) 151/80   Pulse (!) 58   Ht 5\' 6"  (1.676 m)   Wt 161 lb (73 kg)   BMI 25.99 kg/m    Constitutional:  Alert and oriented, No acute distress. Cardiovascular: No clubbing, cyanosis, or edema. Respiratory: Normal respiratory effort, no increased work of breathing. GI: Abdomen is soft, nontender, nondistended, no abdominal masses  Laboratory Data: PSA history reviewed, see HPI  Pertinent Imaging: None to review  Assessment & Plan:   Healthy 59 year old male with family history of lethal prostate cancer and elevated PSA of 6.3, which has increased from 4.2 last year.  We reviewed the implications of an elevated PSA and the uncertainty surrounding it. In general, a man's PSA increases  with age and is produced by both normal and cancerous prostate tissue. The differential diagnosis for elevated PSA includes BPH, prostate cancer, infection, recent intercourse/ejaculation, recent urethroscopic manipulation (foley placement/cystoscopy) or trauma, and prostatitis.   Management of an elevated PSA can include observation or prostate biopsy and we discussed this in detail. Our goal is to detect clinically significant prostate cancers, and manage with either active surveillance, surgery, or radiation for localized disease. Risks of prostate biopsy include bleeding, infection (including life threatening sepsis), pain, and lower urinary symptoms. Hematuria, hematospermia, and blood in the stool are all common after biopsy and can persist up to 4 weeks.   Schedule prostate biopsy  Nickolas Madrid, MD 08/25/2020  [facility] [address] [place], [place] [address] [phone]

## 2020-09-05 ENCOUNTER — Other Ambulatory Visit: Payer: Self-pay | Admitting: Internal Medicine

## 2020-09-05 DIAGNOSIS — K219 Gastro-esophageal reflux disease without esophagitis: Secondary | ICD-10-CM

## 2020-09-17 ENCOUNTER — Other Ambulatory Visit: Payer: Self-pay

## 2020-09-17 ENCOUNTER — Encounter: Payer: Self-pay | Admitting: Urology

## 2020-09-17 ENCOUNTER — Ambulatory Visit: Payer: Managed Care, Other (non HMO) | Admitting: Urology

## 2020-09-17 VITALS — BP 164/83 | HR 69 | Ht 67.0 in | Wt 161.1 lb

## 2020-09-17 DIAGNOSIS — R972 Elevated prostate specific antigen [PSA]: Secondary | ICD-10-CM

## 2020-09-17 MED ORDER — GENTAMICIN SULFATE 40 MG/ML IJ SOLN
80.0000 mg | Freq: Once | INTRAMUSCULAR | Status: AC
  Administered 2020-09-17: 80 mg via INTRAMUSCULAR

## 2020-09-17 MED ORDER — LEVOFLOXACIN 500 MG PO TABS
500.0000 mg | ORAL_TABLET | Freq: Once | ORAL | Status: AC
  Administered 2020-09-17: 500 mg via ORAL

## 2020-09-17 NOTE — Patient Instructions (Signed)
Biopsia de prstata guiada por ecografa transrectal, cuidados posteriores Transrectal Ultrasound-Guided Prostate Biopsy, Care After Lea esta informacin sobre cmo cuidarse despus del procedimiento. El mdico tambin podr darle indicaciones ms especficas. Si tiene problemas o preguntas, llame al mdico. Qu puedo esperar despus del procedimiento? Despus del procedimiento, es comn [employer] siguientes sntomas:  Dolor y Scientist, research (life sciences) en el recto, especialmente al estar sentado.  Orina de color rosa debido a la presencia de pequeas cantidades de sangre en la orina.  Ardor al [place].  Sangre en las heces.  Sangrado del recto.  Sangre en el semen. Siga estas indicaciones en su casa: Medicamentos  Delphi de venta libre y los recetados solamente como se lo haya indicado el mdico.  Si le recetaron un antibitico, tmelo como se lo haya indicado el mdico. No deje de tomar el antibitico aunque comience a sentirse mejor. Actividad   No conduzca durante 24horas si recibi un medicamento para ayudarlo a relajarse (sedante) durante el procedimiento.  Reanude sus actividades normales segn lo indicado por el mdico. Pregntele al mdico qu actividades son seguras para usted.  Consulte a su mdico cundo Energy manager a Clinical biochemist.  No levante ningn objeto que pese ms de 10libras (4,5kg) o el lmite de peso que le indiquen hasta que el mdico le diga que puede [place]. Instrucciones generales   Beba suficiente agua como para mantener la orina de color amarillo plido.  Observe la orina, materia fecal y semen para Hydrographic surveyor cualquier nuevo sangrado o sangrado que [place].  Concurra a todas las visitas de seguimiento como se lo haya indicado el mdico. Esto es importante. Comunquese con un mdico si usted:  Observa cogulos de [employer] orina o las heces.  Nota que su orina tiene mal olor o su olor es inusual.  Siente un dolor muy intenso  en el vientre.  Tiene dificultad para orinar.  Nota que la parte baja del vientre se siente firme.  Tiene sangre en la orina durante ms de 2semanas despus del procedimiento.  Tiene sangre en el semen durante ms de 68meses despus del procedimiento.  Tiene dificultad para Sports administrator.  Malestar estomacal (nuseas).  Vmitos.  Tiene un sangrado nuevo o que empeora en la [place], heces o semen. Solicite ayuda inmediatamente si:  Tiene fiebre o siente escalofros.  Su orina es de color rojo brillante.  Tiene dolor muy intenso que no se alivia con medicamentos.  No puede orinar. Resumen  Despus de este procedimiento, es frecuente Patent attorney y molestias alrededor del recto, especialmente al estar sentado.  Puede tener sangre en la orina y las heces.  Es frecuente tener [employer] semen durante 1 a 42meses.  Si le recetaron un antibitico, tmelo como se lo haya indicado el mdico. No deje de tomar el antibitico aunque comience a sentirse mejor.  Solicite ayuda de inmediato si tiene fiebre o escalofros. Esta informacin no tiene Marine scientist el consejo del mdico. Asegrese de hacerle al mdico cualquier pregunta que tenga. Document Revised: 12/27/2017 Document Reviewed: 12/27/2017 Elsevier Patient Education  2020 [employer].

## 2020-09-17 NOTE — Progress Notes (Signed)
   09/17/20  Indication: Elevated PSA, 6.3  Prostate Biopsy Procedure   Informed consent was obtained, and we discussed the risks of bleeding and infection/sepsis. A time out was performed to ensure correct patient identity.  Pre-Procedure: - Last PSA Level: 6.3 - Gentamicin and levaquin given for antibiotic prophylaxis - Transrectal Ultrasound performed revealing a 35 gm prostate, PSA density 0.18 - No significant hypoechoic or median lobe noted  Procedure: - Prostate block performed using 10 cc 1% lidocaine and biopsies taken from sextant areas, a total of 12 under ultrasound guidance.  Post-Procedure: - Patient tolerated the procedure well - He was counseled to seek immediate medical attention if experiences significant bleeding, fevers, or severe pain - Return in one week to discuss biopsy results  Assessment/ Plan: Will follow up in 1-2 weeks to discuss pathology  Nickolas Madrid, MD 09/17/2020

## 2020-09-22 LAB — SURGICAL PATHOLOGY

## 2020-09-24 ENCOUNTER — Encounter: Payer: Self-pay | Admitting: Urology

## 2020-09-24 ENCOUNTER — Other Ambulatory Visit: Payer: Self-pay

## 2020-09-24 ENCOUNTER — Ambulatory Visit (INDEPENDENT_AMBULATORY_CARE_PROVIDER_SITE_OTHER): Payer: Managed Care, Other (non HMO) | Admitting: Urology

## 2020-09-24 VITALS — BP 124/76 | HR 72 | Ht 66.0 in | Wt 161.0 lb

## 2020-09-24 DIAGNOSIS — Z125 Encounter for screening for malignant neoplasm of prostate: Secondary | ICD-10-CM

## 2020-09-24 DIAGNOSIS — R972 Elevated prostate specific antigen [PSA]: Secondary | ICD-10-CM | POA: Diagnosis not present

## 2020-09-24 DIAGNOSIS — C61 Malignant neoplasm of prostate: Secondary | ICD-10-CM

## 2020-09-24 NOTE — Patient Instructions (Addendum)
Cncer de prstata Prostate Cancer  La prstata es la glndula masculina que ayuda a producir el semen. El cncer de prstata es el crecimiento anormal de clulas en esta glndula. Siga estas indicaciones en su casa:  Tome los medicamentos de venta libre y los recetados solamente como se lo haya indicado el mdico.  Siga una dieta saludable.  Duerma lo suficiente.  Pdale al mdico que lo ayude a buscar un grupo de apoyo para hombres con cncer de prstata.  Concurra a todas las visitas de control como se lo haya indicado el mdico. Esto es importante.  Si tiene que ir al hospital, infrmeselo al especialista en cncer (onclogo).  Toque, abrace y acaricie a su pareja para demostrarle sus sentimientos sexuales. Comunquese con un mdico si:  Tiene problemas para orinar.  Observa sangre en la orina.  Siente dolor en la cadera, la espalda o el pecho. Solicite ayuda de inmediato si:  Siente debilidad en las piernas.  Siente prdida de la sensibilidad (adormecimiento) en las piernas.  No puede controlar las evacuaciones de [place] o materia fecal (heces).  Tiene dificultad para respirar.  Siente un dolor repentino en el pecho.  Tiene escalofros o fiebre. Resumen  La prstata es la glndula masculina que ayuda a producir el semen. El cncer de prstata es el crecimiento anormal de clulas en esta glndula.  Pdale al mdico que lo ayude a buscar un grupo de apoyo para hombres con cncer de prstata.  Comunquese con el mdico si tiene problemas para orinar o algn dolor nuevo que no tena antes. Esta informacin no tiene Marine scientist el consejo del mdico. Asegrese de hacerle al mdico cualquier pregunta que tenga. Document Revised: 08/23/2017 Document Reviewed: 06/17/2016 Elsevier Patient Education  2020 [place] prostate is a male gland that helps make semen. Prostate cancer is when abnormal cells grow in this gland. Follow  these instructions at home:  Take over-the-counter and prescription medicines only as told by your doctor.  Eat a healthy diet.  Get plenty of sleep.  Ask your doctor for help to find a support group for men with prostate cancer.  Keep all follow-up visits as told by your doctor. This is important.  If you have to go to the hospital, let your cancer doctor (oncologist) know.  Touch, hold, hug, and caress your partner to continue to show sexual feelings. Contact a doctor if:  You have trouble peeing (urinating).  You have blood in your pee (urine).  You have pain in your hips, back, or chest. Get help right away if:  You have weakness in your legs.  You lose feeling (have numbness) in your legs.  You cannot control your pee or your poop (stool).  You have trouble breathing.  You have sudden pain in your chest.  You have chills or a fever. Summary  The prostate is a male gland that helps make semen. Prostate cancer is when abnormal cells grow in this gland.  Ask your doctor for help to find a support group for men with prostate cancer.  Contact a doctor if you have problems peeing or have any new pain that you did not have before. This information is not intended to replace advice given to you by your health care provider. Make sure you discuss any questions you have with your health care provider. Document Revised: 11/24/2017 Document Reviewed: 08/22/2016 Elsevier Patient Education  [place].   Prostate Biopsy Instructions  Stop all aspirin or blood thinners (aspirin, plavix, coumadin, warfarin, motrin, ibuprofen, advil, aleve, naproxen, naprosyn) for 7 days prior to the procedure.  If you have any questions about stopping these medications, please contact your primary care physician or cardiologist.  Having a light meal prior to the procedure is recommended.  If you are diabetic or have low blood sugar please bring a small snack or glucose tablet.  A  Fleets enema is needed to be purchased over the counter at a local pharmacy and used 2 hours before you scheduled appointment.  This can be purchased over the counter at any pharmacy.  Antibiotics will be administered in the clinic at the time of the procedure unless otherwise specified.    Please bring someone with you to the procedure to drive you home.  A follow up appointment has been scheduled for you to receive the results of the biopsy.  If you have any questions or concerns, please feel free to call the office at [phone] or send a Mychart message.    Thank you, Staff at [facility]

## 2020-09-24 NOTE — Progress Notes (Signed)
   09/24/2020 4:26 PM   JEORGE REISTER 06/10/1961 [id number]  Reason for visit: Discuss prostate biopsy results  HPI: I saw Mr. Adami and his wife in urology clinic to review his prostate biopsy results.  Briefly, he is a healthy 59 year old male with a family history of lethal prostate cancer in his father who was in his 23s who was found to have an elevated PSA of 6.3, which prompted prostate biopsy. Prostate biopsy on 09/17/2020 showed 35g prostate with PSAd of 0.18, and 1/12 cores with Gleason score 3+3=6 prostate cancer, with 25% MCI. ASAP and HGPIN also present.  We had a lengthy conversation today about the patient's new diagnosis of prostate cancer.  We reviewed the risk classifications per the AUA guidelines including very low risk, low risk, intermediate risk, and high risk disease, and the need for additional staging imaging with CT and bone scan in patients with unfavorable intermediate risk and high risk disease.  I explained that his life expectancy, clinical stage, Gleason score, PSA, and other co-morbidities influence treatment strategies.  We discussed the roles of active surveillance, radiation therapy, surgical therapy with robotic prostatectomy, and hormone therapy with androgen deprivation.  We discussed that patients urinary symptoms also impact treatment strategy, as patients with severe lower urinary tract symptoms may have significant worsening or even develop urinary retention after undergoing radiation.  In regards to surgery, we discussed robotic prostatectomy +/- lymphadenectomy at length.  The procedure takes 3 to 4 hours, and patient's typically discharge home on post-op day #1.  A Foley catheter is left in place for 7 to 10 days to allow for healing of the vesicourethral anastomosis.  There is a small risk of bleeding, infection, damage to surrounding structures or bowel, hernia, DVT/PE, or serious cardiac or pulmonary complications.  We discussed at length post-op side  effects including erectile dysfunction, and the importance of pre-operative erectile function on long-term outcomes.  Even with a nerve sparing approach, there is an approximately 25% rate of permanent erectile dysfunction.  We also discussed postop urinary incontinence at length.  We expect patients to have stress incontinence post-operatively that will improve over period of weeks to months.  Less than 10% of men will require a pad at 1 year after surgery.  Patients will need to avoid heavy lifting and strenuous activity for 3 to 4 weeks, but most men return to their baseline activity status by 6 weeks.  In summary, DAILAN PFALZGRAF is a 59 y.o. man with newly diagnosed low risk prostate cancer. He would like to pursue active surveillance. We discussed the need for a confirmatory biopsy in ~9 months, and need for PSA/DRE every 6-12 months moving forward. We reviewed that ~25% patients over 15 years will discontinue AS and move to definitive treatment based on change in pathology, rising PSA, or anxiety related to surveillance.  RTC 9 months with PSA and for confirmatory biopsy  I spent 45 total minutes on the day of the encounter including pre-visit review of the medical record, face-to-face time with the patient, and post visit ordering of labs/imaging/tests.  Billey Co, [facility] [address], [place], [place] [address] [phone]

## 2020-09-27 ENCOUNTER — Encounter: Payer: Self-pay | Admitting: Internal Medicine

## 2020-10-23 ENCOUNTER — Ambulatory Visit (INDEPENDENT_AMBULATORY_CARE_PROVIDER_SITE_OTHER): Payer: Managed Care, Other (non HMO) | Admitting: Internal Medicine

## 2020-10-23 ENCOUNTER — Encounter: Payer: Self-pay | Admitting: Internal Medicine

## 2020-10-23 ENCOUNTER — Other Ambulatory Visit: Payer: Self-pay

## 2020-10-23 VITALS — BP 118/70 | HR 63 | Temp 97.9°F | Ht 67.0 in | Wt 163.0 lb

## 2020-10-23 DIAGNOSIS — Z1211 Encounter for screening for malignant neoplasm of colon: Secondary | ICD-10-CM

## 2020-10-23 DIAGNOSIS — C61 Malignant neoplasm of prostate: Secondary | ICD-10-CM

## 2020-10-23 DIAGNOSIS — E782 Mixed hyperlipidemia: Secondary | ICD-10-CM

## 2020-10-23 DIAGNOSIS — Z0000 Encounter for general adult medical examination without abnormal findings: Secondary | ICD-10-CM | POA: Diagnosis not present

## 2020-10-23 DIAGNOSIS — K219 Gastro-esophageal reflux disease without esophagitis: Secondary | ICD-10-CM

## 2020-10-23 DIAGNOSIS — G5603 Carpal tunnel syndrome, bilateral upper limbs: Secondary | ICD-10-CM

## 2020-10-23 DIAGNOSIS — I10 Essential (primary) hypertension: Secondary | ICD-10-CM | POA: Diagnosis not present

## 2020-10-23 LAB — POCT URINALYSIS DIPSTICK
Bilirubin, UA: NEGATIVE
Blood, UA: NEGATIVE
Glucose, UA: NEGATIVE
Ketones, UA: NEGATIVE
Leukocytes, UA: NEGATIVE
Nitrite, UA: NEGATIVE
Protein, UA: NEGATIVE
Spec Grav, UA: >=1.03 — AB (ref 1.010–1.025)
Urobilinogen, UA: 0.2 E.U./dL
pH, UA: 6 (ref 5.0–8.0)

## 2020-10-23 MED ORDER — ATORVASTATIN CALCIUM 10 MG PO TABS: ORAL_TABLET | ORAL | 3 refills | Status: DC

## 2020-10-23 MED ORDER — OMEPRAZOLE 20 MG PO CPDR: 20.0000 mg | DELAYED_RELEASE_CAPSULE | Freq: Every day | ORAL | 3 refills | Status: DC

## 2020-10-23 MED ORDER — OLMESARTAN MEDOXOMIL 20 MG PO TABS: 20.0000 mg | ORAL_TABLET | Freq: Every day | ORAL | 3 refills | Status: DC

## 2020-10-23 NOTE — Patient Instructions (Signed)
Get wrist splints for carpal tunnel and wear them while sleeping to take the pressure off of the nerve that runs through your wrists.  Sndrome del tnel carpiano Carpal Tunnel Syndrome  El sndrome del tnel carpiano es una afeccin que causa dolor en la mano y en el brazo. El tnel carpiano es un rea estrecha ubicada en el lado palmar de la [place]. Los movimientos repetidos de la mueca o determinadas enfermedades pueden causar la hinchazn del tnel. Esta hinchazn comprime el nervio principal de la mueca (nervio mediano). Cules son las causas? Esta afeccin puede ser causada por lo siguiente:  Movimientos repetidos de [employer].  Lesiones en la [place].  Artritis.  Un quiste o un tumor en el tnel carpiano.  Acumulacin de lquido Solicitor. A veces, se desconoce la causa de esta afeccin. Qu incrementa el riesgo? Los siguientes factores pueden hacer que sea ms propenso a Emergency planning/management officer afeccin:  Tener un trabajo que requiera mover repetidamente la [place] del mismo modo, por ejemplo, de carnicero o cajero.  Ser mujer.  Tener ciertas afecciones, tales como: ? Diabetes. ? Obesidad. ? Tiroidea hipoactiva (hipotiroidismo). ? Insuficiencia renal. Cules son los signos o sntomas? Los sntomas de esta afeccin incluyen:  Sensacin de hormigueo en los dedos de la mano, especialmente el pulgar, el ndice y el dedo [place].  Hormigueo o adormecimiento en la mano.  Sensacin de Social research officer, government en todo el brazo, especialmente cuando la [place] y el codo estn flexionados durante [place].  Dolor en la mueca que sube por el brazo hasta el hombro.  Dolor que baja hasta la palma de la mano o los dedos.  Sensacin de [employer]. Tal vez tenga dificultad para tomar y Licensed conveyancer. Los sntomas pueden empeorar durante la noche. Cmo se diagnostica? Esta afeccin se diagnostica mediante los antecedentes mdicos y un examen fsico. Tambin pueden hacerle  estudios, que incluyen los siguientes:  Furniture conservator/restorer electromiograma (EMG). Esta prueba mide las seales elctricas que los nervios les envan a los msculos.  Estudio de [employer]. Este estudio permite determinar si las seales elctricas pasan correctamente por los nervios.  Estudios de diagnstico por imgenes, como radiografas, una ecografa y Public house manager (RM). Estos estudios Chiropodist las posibles causas de la afeccin. Cmo se trata? El tratamiento de esta afeccin puede incluir:  Cambios en el estilo de vida. Es importante que deje o cambie la actividad que caus la afeccin.  Hacer ejercicio y actividades para fortalecer los msculos y los huesos (fisioterapia).  Aprender a usar la mano nuevamente despus del diagnstico (terapia ocupacional).  Analgsicos y antiinflamatorios. Esto puede incluir medicamentos que se inyectan en la [place].  Una frula para la [place].  Clementeen Hoof. Siga estas indicaciones en su casa: Si tiene una frula:  Use la frula como se lo haya indicado el mdico. Qutesela solamente como se lo haya indicado el mdico.  Afloje la frula si los dedos se le adormecen, siente hormigueo o se le enfran y se tornan de Optician, dispensing.  Mantenga la frula limpia.  Si la frula no es impermeable: ? No deje que se moje. ? Cbrala con un envoltorio hermtico cuando tome un bao de inmersin o [place]. Control del dolor, la rigidez y la hinchazn   Si se lo indican, aplique hielo sobre la zona del dolor: ? Si tiene una frula desmontable, qutesela como se lo haya indicado el mdico. ? Ponga el hielo en una bolsa plstica. ? Coloque una toalla  entre la piel y Therapist, nutritional. ? Coloque el hielo durante 21minutos, 2a3veces al da. Indicaciones generales  Delphi de venta libre y los recetados solamente como se lo haya indicado el mdico.  Descanse la [place] de toda actividad que le cause dolor. Si la afeccin tiene  relacin con Leander Rams, hable con su empleador [employer] pueden [place], por [place], usar una almohadilla para apoyar la mueca mientras tipea.  Haga los ejercicios como se lo hayan indicado el mdico, el fisioterapeuta o el terapeuta ocupacional.  Concurra a todas las visitas de seguimiento como se lo haya indicado el mdico. Esto es importante. Comunquese con un mdico si:  Aparecen nuevos sntomas.  El dolor no se alivia con los [employer].  Sus sntomas empeoran. Solicite ayuda de inmediato si:  Tiene hormigueo o adormecimiento intensos en la mueca o la mano. Resumen  El sndrome del tnel carpiano es una afeccin que causa dolor en la mano y en el brazo.  Generalmente se debe a movimientos repetidos de [employer].  El sndrome del tnel carpiano se trata mediante cambios en el estilo de vida y medicamentos. Tambin puede indicarse la [place].  Siga las indicaciones del mdico sobre el uso de una frula, el descanso de la [place], la asistencia a las visitas de seguimiento y Solicitor para pedir ayuda. Esta informacin no tiene Marine scientist el consejo del mdico. Asegrese de hacerle al mdico cualquier pregunta que tenga. Document Revised: 05/17/2018 Document Reviewed: 05/17/2018 Elsevier Patient Education  2020 [employer].

## 2020-10-23 NOTE — Progress Notes (Signed)
Date:  10/23/2020   Name:  [name]   DOB:  15-Apr-1961   MRN:  [id number]   Chief Complaint: Annual Exam  [name] is a 59 y.o. male who presents today for his Complete Annual Exam. He feels fairly well. He reports exercising - none. He reports he is sleeping well.   Colonoscopy: FIT 01/2020  Immunization History  Administered Date(s) Administered  . Influenza-Unspecified 09/15/2017, 09/15/2020  . Moderna SARS-COVID-2 Vaccination 07/26/2020, 08/26/2020    Hypertension This is a chronic problem. The problem is controlled. Pertinent negatives include no chest pain, headaches, palpitations or shortness of breath. Past treatments include angiotensin blockers. The current treatment provides significant improvement. There are no compliance problems.  There is no history of kidney disease or CAD/MI.  Hyperlipidemia The problem is controlled. Pertinent negatives include no chest pain, myalgias or shortness of breath. Current antihyperlipidemic treatment includes statins. The current treatment provides significant improvement of lipids.  Gastroesophageal Reflux He complains of heartburn. He reports no abdominal pain, no chest pain, no choking or no wheezing. This is a recurrent problem. The problem occurs occasionally. Pertinent negatives include no fatigue. He has tried a PPI for the symptoms. The treatment provided significant relief.  Prostate cancer - new diagnosis - seen by Urology.  Options discussed and he elected active surveillance.  He will follow up in June 2022 for PSA and repeat biopsies. Hand numbness - he gets tingling and numbness in both hands every night. If he straightens his arms, it will resolve. He denies weakness or pain.  He has not tried anything for it.  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   Lab Results  Component Value Date   CREATININE 1.12 10/16/2019   BUN 14 10/16/2019   NA 141 10/16/2019   K 4.8 10/16/2019   CL  102 10/16/2019   CO2 25 10/16/2019   Lab Results  Component Value Date   CHOL 176 10/16/2019   HDL 38 (L) 10/16/2019   LDLCALC 115 (H) 10/16/2019   TRIG 125 10/16/2019   CHOLHDL 4.6 10/16/2019   Lab Results  Component Value Date   TSH 7.690 (H) 10/09/2017   No results found for: HGBA1C Lab Results  Component Value Date   WBC 8.4 10/16/2019   HGB 16.3 10/16/2019   HCT 49.2 10/16/2019   MCV 90 10/16/2019   PLT 242 10/16/2019   Lab Results  Component Value Date   ALT 37 10/16/2019   AST 24 10/16/2019   ALKPHOS 73 10/16/2019   BILITOT 0.3 10/16/2019     Review of Systems  Constitutional: Negative for appetite change, chills, diaphoresis, fatigue and unexpected weight change.  HENT: Negative for hearing loss, tinnitus, trouble swallowing and voice change.   Eyes: Negative for visual disturbance.  Respiratory: Negative for choking, shortness of breath and wheezing.   Cardiovascular: Negative for chest pain, palpitations and leg swelling.  Gastrointestinal: Positive for heartburn. Negative for abdominal pain, blood in stool, constipation and diarrhea.  Genitourinary: Negative for difficulty urinating, dysuria, frequency and hematuria.  Musculoskeletal: Negative for arthralgias, back pain and myalgias.  Skin: Negative for color change and rash.  Neurological: Positive for numbness (in both hands at night). Negative for dizziness, syncope and headaches.  Hematological: Negative for adenopathy.  Psychiatric/Behavioral: Negative for dysphoric mood and sleep disturbance.    Patient Active Problem List   Diagnosis Date Noted  . Tobacco use disorder, severe, in sustained remission 10/16/2019  . Gastroesophageal  reflux disease 01/28/2019  . 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 Prostate cancer ([place]) 07/27/2018  . Hyperlipidemia, mixed 01/27/2018  . Nocturia 10/09/2017  . Essential hypertension 10/09/2017    No Known Allergies  Past Surgical History:  Procedure Laterality  Date  . lung collapse  2013    Social History   Tobacco Use  . Smoking status: Former Smoker    Packs/day: 0.25    Years: 40.00    Pack years: 10.00    Types: Cigarettes    Quit date: 11/25/2017    Years since quitting: 2.9  . Smokeless tobacco: Never Used  Vaping Use  . Vaping Use: Never used  Substance Use Topics  . Alcohol use: No  . Drug use: No     Medication list has been reviewed and updated.  Current Meds  Medication Sig  . atorvastatin (LIPITOR) 10 MG tablet TAKE 1 TABLET(10 MG) BY MOUTH DAILY  . olmesartan (BENICAR) 20 MG tablet Take 1 tablet (20 mg total) by mouth daily.  Marland Kitchen omeprazole (PRILOSEC) 20 MG capsule TAKE 1 CAPSULE(20 MG) BY MOUTH DAILY    PHQ 2/9 Scores 10/23/2020 10/16/2019 01/28/2019 07/27/2018  PHQ - 2 Score 0 0 0 0  PHQ- 9 Score 0 - - -    GAD 7 : Generalized Anxiety Score 10/23/2020  Nervous, Anxious, on Edge 0  Control/stop worrying 0  Worry too much - different things 0  Trouble relaxing 0  Restless 0  Easily annoyed or irritable 0  Afraid - awful might happen 0  Total GAD 7 Score 0  Anxiety Difficulty Not difficult at all    BP Readings from Last 3 Encounters:  10/23/20 118/70  09/24/20 124/76  09/17/20 (!) 164/83    Physical Exam Vitals and nursing note reviewed.  Constitutional:      Appearance: Normal appearance. He is well-developed.  HENT:     Head: Normocephalic.     Right Ear: Tympanic membrane, ear canal and external ear normal.     Left Ear: Tympanic membrane, ear canal and external ear normal.     Nose: Nose normal.     Mouth/Throat:     Pharynx: Uvula midline.  Eyes:     Conjunctiva/sclera: Conjunctivae normal.     Pupils: Pupils are equal, round, and reactive to light.  Neck:     Thyroid: No thyromegaly.     Vascular: No carotid bruit.  Cardiovascular:     Rate and Rhythm: Normal rate and regular rhythm.     Heart sounds: Normal heart sounds.  Pulmonary:     Effort: Pulmonary effort is normal.     Breath  sounds: Normal breath sounds. No wheezing.  Chest:     Breasts:        Right: No mass.        Left: No mass.  Abdominal:     General: Bowel sounds are normal.     Palpations: Abdomen is soft.     Tenderness: There is no abdominal tenderness.  Musculoskeletal:        General: Normal range of motion.     Cervical back: Normal range of motion and neck supple.  Lymphadenopathy:     Cervical: No cervical adenopathy.  Skin:    General: Skin is warm and dry.  Neurological:     Mental Status: He is alert and oriented to person, place, and time.     Motor: Motor function is intact. No weakness or tremor.     Deep Tendon  Reflexes: Reflexes are normal and symmetric.     Reflex Scores:      Bicep reflexes are 2+ on the right side and 2+ on the left side.      Patellar reflexes are 2+ on the right side and 2+ on the left side.    Comments: Positive Phalen's sign bilaterally  Psychiatric:        Speech: Speech normal.        Behavior: Behavior normal.        Thought Content: Thought content normal.        Judgment: Judgment normal.     Wt Readings from Last 3 Encounters:  10/23/20 163 lb (73.9 kg)  09/24/20 161 lb (73 kg)  09/17/20 161 lb 1.6 oz (73.1 kg)    BP 118/70   Pulse 63   Temp 97.9 F (36.6 C) (Oral)   Ht 5\' 7"  (1.702 m)   Wt 163 lb (73.9 kg)   SpO2 95%   BMI 25.53 kg/m   Assessment and Plan: 1. Annual physical exam Normal exam Continue healthy diet - POCT urinalysis dipstick  2. Colon cancer screening - Fecal occult blood, imunochemical  3. Essential hypertension Clinically stable exam with well controlled BP on benicar. Tolerating medications without side effects at this time. Pt to continue current regimen and low sodium diet; benefits of regular exercise as able discussed. - CBC with Differential/Platelet - Comprehensive metabolic panel - olmesartan (BENICAR) 20 MG tablet; Take 1 tablet (20 mg total) by mouth daily.  Dispense: 90 tablet; Refill: 3  4.  Hyperlipidemia, mixed Cholesterol well controlled on high intensity statin - Comprehensive metabolic panel - Lipid panel - atorvastatin (LIPITOR) 10 MG tablet; TAKE 1 TABLET(10 MG) BY MOUTH DAILY  Dispense: 90 tablet; Refill: 3  5. Gastroesophageal reflux disease, unspecified whether esophagitis present Symptoms well controlled on daily PPI No red flag signs such as weight loss, n/v, melena Will continue omeprazole. - CBC with Differential/Platelet - omeprazole (PRILOSEC) 20 MG capsule; Take 1 capsule (20 mg total) by mouth daily.  Dispense: 90 capsule; Refill: 3  6. Prostate cancer [facility]) Under active surveillance with Urology He is hesitant to undergo another biopsy - I encourage him to follow through as recommended.    7. Carpal tunnel syndrome, bilateral Recommend cock up wrist splints to be worn while sleeping nightly If worsening, would refer to Ortho - TSH   Partially dictated using Editor, commissioning. Any errors are unintentional.  Halina Maidens, MD [facility]  10/23/2020

## 2020-10-24 LAB — COMPREHENSIVE METABOLIC PANEL
ALT: 40 IU/L (ref 0–44)
AST: 23 IU/L (ref 0–40)
Albumin/Globulin Ratio: 1.4 (ref 1.2–2.2)
Albumin: 4.6 g/dL (ref 3.8–4.8)
Alkaline Phosphatase: 81 IU/L (ref 44–121)
BUN/Creatinine Ratio: 15 (ref 10–24)
BUN: 16 mg/dL (ref 8–27)
Bilirubin Total: 0.3 mg/dL (ref 0.0–1.2)
CO2: 23 mmol/L (ref 20–29)
Calcium: 9.9 mg/dL (ref 8.6–10.2)
Chloride: 103 mmol/L (ref 96–106)
Creatinine, Ser: 1.05 mg/dL (ref 0.76–1.27)
GFR calc Af Amer: 88 mL/min/{1.73_m2} (ref >59)
GFR calc non Af Amer: 76 mL/min/{1.73_m2} (ref >59)
Globulin, Total: 3.4 g/dL (ref 1.5–4.5)
Glucose: 112 mg/dL — ABNORMAL HIGH (ref 65–99)
Potassium: 4.9 mmol/L (ref 3.5–5.2)
Sodium: 142 mmol/L (ref 134–144)
Total Protein: 8 g/dL (ref 6.0–8.5)

## 2020-10-24 LAB — CBC WITH DIFFERENTIAL/PLATELET
Basophils Absolute: 0.1 10*3/uL (ref 0.0–0.2)
Basos: 1 %
EOS (ABSOLUTE): 0.3 10*3/uL (ref 0.0–0.4)
Eos: 4 %
Hematocrit: 51.2 % — ABNORMAL HIGH (ref 37.5–51.0)
Hemoglobin: 17 g/dL (ref 13.0–17.7)
Immature Grans (Abs): 0 10*3/uL (ref 0.0–0.1)
Immature Granulocytes: 1 %
Lymphocytes Absolute: 2.9 10*3/uL (ref 0.7–3.1)
Lymphs: 38 %
MCH: 29.5 pg (ref 26.6–33.0)
MCHC: 33.2 g/dL (ref 31.5–35.7)
MCV: 89 fL (ref 79–97)
Monocytes Absolute: 0.6 10*3/uL (ref 0.1–0.9)
Monocytes: 8 %
Neutrophils Absolute: 3.9 10*3/uL (ref 1.4–7.0)
Neutrophils: 48 %
Platelets: 258 10*3/uL (ref 150–450)
RBC: 5.77 x10E6/uL (ref 4.14–5.80)
RDW: 13.3 % (ref 11.6–15.4)
WBC: 7.8 10*3/uL (ref 3.4–10.8)

## 2020-10-24 LAB — LIPID PANEL
Chol/HDL Ratio: 4.9 ratio (ref 0.0–5.0)
Cholesterol, Total: 201 mg/dL — ABNORMAL HIGH (ref 100–199)
HDL: 41 mg/dL (ref >39)
LDL Chol Calc (NIH): 131 mg/dL — ABNORMAL HIGH (ref 0–99)
Triglycerides: 164 mg/dL — ABNORMAL HIGH (ref 0–149)
VLDL Cholesterol Cal: 29 mg/dL (ref 5–40)

## 2020-10-24 LAB — TSH: TSH: 26.9 u[IU]/mL — ABNORMAL HIGH (ref 0.450–4.500)

## 2020-10-26 ENCOUNTER — Other Ambulatory Visit: Payer: Self-pay | Admitting: Internal Medicine

## 2020-10-26 DIAGNOSIS — I10 Essential (primary) hypertension: Secondary | ICD-10-CM

## 2020-11-04 ENCOUNTER — Encounter: Payer: Self-pay | Admitting: Internal Medicine

## 2020-11-04 DIAGNOSIS — R7303 Prediabetes: Secondary | ICD-10-CM | POA: Insufficient documentation

## 2020-11-04 LAB — HGB A1C W/O EAG: Hgb A1c MFr Bld: 6.3 % — ABNORMAL HIGH (ref 4.8–5.6)

## 2020-11-04 LAB — T4: T4, Total: 4.7 ug/dL (ref 4.5–12.0)

## 2020-11-04 LAB — T3: T3, Total: 107 ng/dL (ref 71–180)

## 2020-11-04 LAB — SPECIMEN STATUS REPORT

## 2021-06-12 ENCOUNTER — Other Ambulatory Visit: Payer: Self-pay

## 2021-06-12 ENCOUNTER — Ambulatory Visit
Admission: EM | Admit: 2021-06-12 | Discharge: 2021-06-12 | Disposition: A | Discharge status: Home / Self Care | Payer: Managed Care, Other (non HMO) | Attending: Emergency Medicine | Admitting: Emergency Medicine

## 2021-06-12 ENCOUNTER — Ambulatory Visit (INDEPENDENT_AMBULATORY_CARE_PROVIDER_SITE_OTHER): Payer: Managed Care, Other (non HMO)

## 2021-06-12 DIAGNOSIS — T148XXA Other injury of unspecified body region, initial encounter: Secondary | ICD-10-CM | POA: Insufficient documentation

## 2021-06-12 DIAGNOSIS — M25571 Pain in right ankle and joints of right foot: Secondary | ICD-10-CM | POA: Diagnosis not present

## 2021-06-12 LAB — GLUCOSE, CAPILLARY: Glucose-Capillary: 114 mg/dL — ABNORMAL HIGH (ref 70–99)

## 2021-06-12 MED ORDER — MUPIROCIN CALCIUM 2 % NA OINT: TOPICAL_OINTMENT | NASAL | 0 refills | Status: DC
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
## 2021-06-12 NOTE — Discharge Instructions (Addendum)
Apply mupirocin ointment 3 times a day to the wound on your right ankle and cover with a dry dressing.  I have referred you to the wound care center at [facility].  They will reach out to you for an appointment.  If your symptoms worsen follow-up with Dr. Army Melia.

## 2021-06-18 ENCOUNTER — Other Ambulatory Visit: Payer: Self-pay

## 2021-06-18 ENCOUNTER — Encounter: Payer: Managed Care, Other (non HMO) | Attending: Physician Assistant | Admitting: Physician Assistant

## 2021-06-18 DIAGNOSIS — I10 Essential (primary) hypertension: Secondary | ICD-10-CM | POA: Diagnosis not present

## 2021-06-18 DIAGNOSIS — X58XXXA Exposure to other specified factors, initial encounter: Secondary | ICD-10-CM | POA: Insufficient documentation

## 2021-06-18 DIAGNOSIS — S91011A Laceration without foreign body, right ankle, initial encounter: Secondary | ICD-10-CM | POA: Insufficient documentation

## 2021-06-18 DIAGNOSIS — E785 Hyperlipidemia, unspecified: Secondary | ICD-10-CM | POA: Diagnosis not present

## 2021-06-18 DIAGNOSIS — L97312 Non-pressure chronic ulcer of right ankle with fat layer exposed: Secondary | ICD-10-CM | POA: Insufficient documentation

## 2021-06-18 NOTE — Progress Notes (Signed)
TAYLEN, OSORTO ([id number]) Visit Report for 06/18/2021 Abuse/Suicide Risk Screen Details Patient Name: [name], [name] Date of Service: 06/18/2021 10:15 AM Medical Record Number: [id number] Patient Account Number: [id number] Date of Birth/Sex: April 19, 1961 (60 y.o. Male) Treating RN: Dolan Amen Primary Care Khoa Opdahl: Halina Maidens Other Clinician: Referring Janelli Welling: Margarette Canada Treating Lindsay Soulliere/Extender: Skipper Cliche in Treatment: 0 Abuse/Suicide Risk Screen Items Answer ABUSE RISK SCREEN: Has anyone close to you tried to hurt or harm you recentlyo No Do you feel uncomfortable with anyone in your familyo No Has anyone forced you do things that you didnot want to doo No Electronic Signature(s) Signed: 06/18/2021 4:29:12 PM By: Georges Mouse, Minus Breeding RN Entered By: Georges Mouse, Minus Breeding on 06/18/2021 10:39:21 [name] ([id number]) -------------------------------------------------------------------------------- Activities of Daily Living Details Patient Name: [name] Date of Service: 06/18/2021 10:15 AM Medical Record Number: [id number] Patient Account Number: [id number] Date of Birth/Sex: 1961-12-10 (60 y.o. Male) Treating RN: Dolan Amen Primary Care Shelena Castelluccio: Halina Maidens Other Clinician: Referring Nevayah Faust: Margarette Canada Treating Vashaun Osmon/Extender: Skipper Cliche in Treatment: 0 Activities of Daily Living Items Answer Activities of Daily Living (Please select one for each item) Drive Automobile Completely Able Take Medications Completely Able Use Telephone Completely Able Care for Appearance Completely Able Use Toilet Completely Able Bath / Shower Completely Able Dress Self Completely Able Feed Self Completely Able Walk Completely Able Get In / Out Bed Completely Able Housework Completely Able Prepare Meals Completely Terral for Self Completely Able Electronic Signature(s) Signed: 06/18/2021  4:29:12 PM By: Georges Mouse, Minus Breeding RN Entered By: Georges Mouse, Minus Breeding on 06/18/2021 10:39:38 [name] ([id number]) -------------------------------------------------------------------------------- Education Screening Details Patient Name: [name] Date of Service: 06/18/2021 10:15 AM Medical Record Number: [id number] Patient Account Number: [id number] Date of Birth/Sex: [birth year] (60 y.o. Male) Treating RN: Dolan Amen Primary Care Joceline Hinchcliff: Halina Maidens Other Clinician: Referring Kyshawn Teal: Margarette Canada Treating Saharsh Sterling/Extender: Skipper Cliche in Treatment: 0 Primary Learner Assessed: Patient Learning Preferences/Education Level/Primary Language Learning Preference: Explanation, Demonstration Highest Education Level: High School Preferred Language: Spanish; Castilian Cognitive Barrier Language Barrier: No Translator Needed: No Memory Deficit: No Emotional Barrier: No Cultural/Religious Beliefs Affecting Medical Care: No Physical Barrier Impaired Vision: Yes Glasses Impaired Hearing: No Decreased Hand dexterity: No Knowledge/Comprehension Knowledge Level: Medium Comprehension Level: Medium Ability to understand written instructions: Medium Ability to understand verbal instructions: Medium Motivation Anxiety Level: Calm Cooperation: Cooperative Education Importance: Acknowledges Need Interest in Health Problems: Asks Questions Perception: Coherent Willingness to Engage in Self-Management High Activities: Readiness to Engage in Self-Management High Activities: Electronic Signature(s) Signed: 06/18/2021 4:29:12 PM By: Georges Mouse, Minus Breeding RN Entered By: Georges Mouse, Kenia on 06/18/2021 10:40:16 [name] ([id number]) -------------------------------------------------------------------------------- Fall Risk Assessment Details Patient Name: [name] Date of Service: 06/18/2021 10:15 AM Medical Record Number:  [id number] Patient Account Number: [id number] Date of Birth/Sex: [birth year] (60 y.o. Male) Treating RN: Dolan Amen Primary Care Truc Winfree: Halina Maidens Other Clinician: Referring Kataleya Zaugg: Margarette Canada Treating Karlei Waldo/Extender: Skipper Cliche in Treatment: 0 Fall Risk Assessment Items Have you had 2 or more falls in the last 12 monthso 0 No Have you had any fall that resulted in injury in the last 12 monthso 0 No FALLS RISK SCREEN History of falling - immediate or within 3 months 0 No Secondary diagnosis (Do you have 2 or more medical diagnoseso) 0 No Ambulatory aid None/bed rest/wheelchair/nurse 0 Yes Crutches/cane/walker 0 No Furniture 0 No Intravenous therapy Access/Saline/Heparin Lock 0 No Gait/Transferring Normal/ bed  rest/ wheelchair 0 Yes Weak (short steps with or without shuffle, stooped but able to lift head while walking, may 0 No seek support from furniture) Impaired (short steps with shuffle, may have difficulty arising from chair, head down, impaired 0 No balance) Mental Status Oriented to own ability 0 Yes Electronic Signature(s) Signed: 06/18/2021 4:29:12 PM By: Georges Mouse, Minus Breeding RN Entered By: Georges Mouse, Kenia on 06/18/2021 10:40:34 [name] ([id number]) -------------------------------------------------------------------------------- Foot Assessment Details Patient Name: [name] Date of Service: 06/18/2021 10:15 AM Medical Record Number: [id number] Patient Account Number: [id number] Date of Birth/Sex: [birth year] (60 y.o. Male) Treating RN: Dolan Amen Primary Care Truxton Stupka: Halina Maidens Other Clinician: Referring Lois Slagel: Margarette Canada Treating Amanuel Sinkfield/Extender: Skipper Cliche in Treatment: 0 Foot Assessment Items Site Locations + = Sensation present, - = Sensation absent, C = Callus, U = Ulcer R = Redness, W = Warmth, M = Maceration, PU = Pre-ulcerative lesion F = Fissure, S = Swelling, D =  Dryness Assessment Right: Left: Other Deformity: No No Prior Foot Ulcer: No No Prior Amputation: No No Charcot Joint: No No Ambulatory Status: Ambulatory Without Help Gait: Steady Electronic Signature(s) Signed: 06/18/2021 4:29:12 PM By: Georges Mouse, Minus Breeding RN Entered By: Georges Mouse, Minus Breeding on 06/18/2021 10:42:16 [name] ([id number]) -------------------------------------------------------------------------------- Nutrition Risk Screening Details Patient Name: [name] Date of Service: 06/18/2021 10:15 AM Medical Record Number: [id number] Patient Account Number: [id number] Date of Birth/Sex: February 07, 1961 (60 y.o. Male) Treating RN: Dolan Amen Primary Care Chelsa Stout: Halina Maidens Other Clinician: Referring Adaia Matthies: Margarette Canada Treating Claude Swendsen/Extender: Jeri Cos Weeks in Treatment: 0 Height (in): 66 Weight (lbs): 161 Body Mass Index (BMI): 26 Nutrition Risk Screening Items Score Screening NUTRITION RISK SCREEN: I have an illness or condition that made me change the kind and/or amount of food I eat 0 No I eat fewer than two meals per day 0 No I eat few fruits and vegetables, or milk products 0 No I have three or more drinks of beer, liquor or wine almost every day 0 No I have tooth or mouth problems that make it hard for me to eat 0 No I don't always have enough money to buy the food I need 0 No I eat alone most of the time 0 No I take three or more different prescribed or over-the-counter drugs a day 0 No Without wanting to, I have lost or gained 10 pounds in the last six months 0 No I am not always physically able to shop, cook and/or feed myself 0 No Nutrition Protocols Good Risk Protocol 0 No interventions needed Moderate Risk Protocol High Risk Proctocol Risk Level: Good Risk Score: 0 Electronic Signature(s) Signed: 06/18/2021 4:29:12 PM By: Georges Mouse, Minus Breeding RN Entered By: Georges Mouse, Minus Breeding on 06/18/2021 10:40:45

## 2021-06-18 NOTE — Progress Notes (Signed)
CORDARO, MUKAI ([id number]) Visit Report for 06/18/2021 Allergy List Details Patient Name: [name], [name] Date of Service: 06/18/2021 10:15 AM Medical Record Number: [id number] Patient Account Number: [id number] Date of Birth/Sex: 03-01-1961 (60 y.o. Male) Treating RN: Dolan Amen Primary Care Derrick Orris: Halina Maidens Other Clinician: Referring Zaiden Ludlum: Margarette Canada Treating Mia Milan/Extender: Jeri Cos Weeks in Treatment: 0 Allergies Active Allergies No Known Drug Allergies Allergy Notes Electronic Signature(s) Signed: 06/18/2021 4:29:12 PM By: Georges Mouse, Minus Breeding RN Entered By: Georges Mouse, Minus Breeding on 06/18/2021 10:37:37 [name] ([id number]) -------------------------------------------------------------------------------- Arrival Information Details Patient Name: [name] Date of Service: 06/18/2021 10:15 AM Medical Record Number: [id number] Patient Account Number: [id number] Date of Birth/Sex: 12-05-1961 (61 y.o. Male) Treating RN: Dolan Amen Primary Care Mikiah Demond: Halina Maidens Other Clinician: Referring Dellar Traber: Margarette Canada Treating Vernadine Coombs/Extender: Skipper Cliche in Treatment: 0 Visit Information Patient Arrived: Ambulatory Arrival Time: 10:32 Accompanied By: wife Transfer Assistance: None Patient Identification Verified: Yes Secondary Verification Process Completed: Yes Electronic Signature(s) Signed: 06/18/2021 4:29:12 PM By: Georges Mouse, Minus Breeding RN Entered By: Georges Mouse, Minus Breeding on 06/18/2021 10:32:15 [name] ([id number]) -------------------------------------------------------------------------------- Clinic Level of Care Assessment Details Patient Name: [name] Date of Service: 06/18/2021 10:15 AM Medical Record Number: [id number] Patient Account Number: [id number] Date of Birth/Sex: 1961/05/31 (60 y.o. Male) Treating RN: Carlene Coria Primary Care Haillee Ryan: Halina Maidens Other  Clinician: Referring Brynley Cuddeback: Margarette Canada Treating Aymar Whitfill/Extender: Skipper Cliche in Treatment: 0 Clinic Level of Care Assessment Items TOOL 1 Quantity Score X - Use when EandM and Procedure is performed on INITIAL visit 1 0 ASSESSMENTS - Nursing Assessment / Reassessment X - General Physical Exam (combine w/ comprehensive assessment (listed just below) when performed on new 1 20 pt. evals) X- 1 25 Comprehensive Assessment (HX, ROS, Risk Assessments, Wounds Hx, etc.) ASSESSMENTS - Wound and Skin Assessment / Reassessment []  - Dermatologic / Skin Assessment (not related to wound area) 0 ASSESSMENTS - Ostomy and/or Continence Assessment and Care []  - Incontinence Assessment and Management 0 []  - 0 Ostomy Care Assessment and Management (repouching, etc.) PROCESS - Coordination of Care X - Simple Patient / Family Education for ongoing care 1 15 []  - 0 Complex (extensive) Patient / Family Education for ongoing care []  - 0 Staff obtains Programmer, systems, Records, Test Results / Process Orders []  - 0 Staff telephones HHA, Nursing Homes / Clarify orders / etc []  - 0 Routine Transfer to another Facility (non-emergent condition) []  - 0 Routine Hospital Admission (non-emergent condition) X- 1 15 New Admissions / Biomedical engineer / Ordering NPWT, Apligraf, etc. []  - 0 Emergency Hospital Admission (emergent condition) PROCESS - Special Needs []  - Pediatric / Minor Patient Management 0 []  - 0 Isolation Patient Management []  - 0 Hearing / Language / Visual special needs []  - 0 Assessment of Community assistance (transportation, D/C planning, etc.) []  - 0 Additional assistance / Altered mentation []  - 0 Support Surface(s) Assessment (bed, cushion, seat, etc.) INTERVENTIONS - Miscellaneous []  - External ear exam 0 []  - 0 Patient Transfer (multiple staff / Civil Service fast streamer / Similar devices) []  - 0 Simple Staple / Suture removal (25 or less) []  - 0 Complex Staple / Suture  removal (26 or more) []  - 0 Hypo/Hyperglycemic Management (do not check if billed separately) X- 1 15 Ankle / Brachial Index (ABI) - do not check if billed separately Has the patient been seen at the hospital within the last three years: Yes Total Score: 90 Level Of Care: New/Established - Level 3  CRISTOBAL, ADVANI ([id number]) Electronic Signature(s) Signed: 06/18/2021 4:44:14 PM By: Carlene Coria RN Entered By: Carlene Coria on 06/18/2021 11:39:19 [name] ([id number]) -------------------------------------------------------------------------------- Lower Extremity Assessment Details Patient Name: [name] Date of Service: 06/18/2021 10:15 AM Medical Record Number: [id number] Patient Account Number: [id number] Date of Birth/Sex: Jun 20, 1961 (60 y.o. Male) Treating RN: Dolan Amen Primary Care Kaisey Huseby: Halina Maidens Other Clinician: Referring Lynx Goodrich: Margarette Canada Treating Everleigh Colclasure/Extender: Jeri Cos Weeks in Treatment: 0 Edema Assessment Assessed: [Left: No] [Right: Yes] Edema: [Left: N] [Right: o] Vascular Assessment Pulses: Dorsalis Pedis Palpable: [Right:Yes] Doppler Audible: [Right:Yes] Posterior Tibial Palpable: [Right:Yes] Doppler Audible: [Right:Yes] Blood Pressure: Brachial: [[id number]] Dorsalis Pedis: 110 Ankle: Posterior Tibial: 116 Ankle Brachial Index: [Right:1.09] Electronic Signature(s) Signed: 06/18/2021 4:29:12 PM By: Georges Mouse, Minus Breeding RN Entered By: Georges Mouse, Minus Breeding on 06/18/2021 10:54:19 [name] ([id number]) -------------------------------------------------------------------------------- Multi Wound Chart Details Patient Name: [name] Date of Service: 06/18/2021 10:15 AM Medical Record Number: [id number] Patient Account Number: [id number] Date of Birth/Sex: 23-Oct-1961 (60 y.o. Male) Treating RN: Carlene Coria Primary Care Raeshaun Simson: Halina Maidens Other Clinician: Referring Anndrea Mihelich: Margarette Canada Treating Mathias Bogacki/Extender: Skipper Cliche in Treatment: 0 Vital Signs Height(in): 66 Pulse(bpm): 61 Weight(lbs): 161 Blood Pressure(mmHg): 124/66 Body Mass Index(BMI): 26 Temperature(F): 98.1 Respiratory Rate(breaths/min): 18 Photos: [N/A:N/A] Wound Location: Right, Lateral Malleolus N/A N/A Wounding Event: Trauma N/A N/A Primary Etiology: Trauma, Other N/A N/A Comorbid History: Hypertension N/A N/A Date Acquired: 05/09/2021 N/A N/A Weeks of Treatment: 0 N/A N/A Wound Status: Open N/A N/A Measurements L x W x D (cm) 1x1x0.5 N/A N/A Area (cm) : 0.785 N/A N/A Volume (cm) : 0.393 N/A N/A Starting Position 1 (o'clock): 1 Ending Position 1 (o'clock): 5 Maximum Distance 1 (cm): 0.4 Undermining: Yes N/A N/A Classification: Full Thickness Without Exposed N/A N/A Support Structures Exudate Amount: Medium N/A N/A Exudate Type: Serosanguineous N/A N/A Exudate Color: red, brown N/A N/A Granulation Amount: None Present (0%) N/A N/A Necrotic Amount: Large (67-100%) N/A N/A Exposed Structures: Fat Layer (Subcutaneous Tissue): N/A N/A Yes Fascia: No Tendon: No Muscle: No Joint: No Bone: No Epithelialization: None N/A N/A Treatment Notes Electronic Signature(s) Signed: 06/18/2021 4:44:14 PM By: Carlene Coria RN Entered By: Carlene Coria on 06/18/2021 11:34:12 [name] ([id number]) -------------------------------------------------------------------------------- [place] Details Patient Name: [name] Date of Service: 06/18/2021 10:15 AM Medical Record Number: [id number] Patient Account Number: [id number] Date of Birth/Sex: 11/30/1961 (60 y.o. Male) Treating RN: Carlene Coria Primary Care Maxxwell Edgett: Halina Maidens Other Clinician: Referring Kaizer Dissinger: Margarette Canada Treating Lewin Pellow/Extender: Skipper Cliche in Treatment: 0 Active Inactive Wound/Skin Impairment Nursing Diagnoses: Knowledge deficit related to  ulceration/compromised skin integrity Goals: Patient/caregiver will verbalize understanding of skin care regimen Date Initiated: 06/18/2021 Target Resolution Date: 07/18/2021 Goal Status: Active Ulcer/skin breakdown will have a volume reduction of 30% by week 4 Date Initiated: 06/18/2021 Target Resolution Date: 07/18/2021 Goal Status: Active Ulcer/skin breakdown will have a volume reduction of 50% by week 8 Date Initiated: 06/18/2021 Target Resolution Date: 08/18/2021 Goal Status: Active Ulcer/skin breakdown will have a volume reduction of 80% by week 12 Date Initiated: 06/18/2021 Target Resolution Date: 09/18/2021 Goal Status: Active Ulcer/skin breakdown will heal within 14 weeks Date Initiated: 06/18/2021 Target Resolution Date: 10/18/2021 Goal Status: Active Interventions: Assess patient/caregiver ability to obtain necessary supplies Assess patient/caregiver ability to perform ulcer/skin care regimen upon admission and as needed Assess ulceration(s) every visit Notes: Electronic Signature(s) Signed: 06/18/2021 4:44:14 PM By: Carlene Coria RN Entered By: Carlene Coria on 06/18/2021 11:33:46  VALERIANO, BAIN ([id number]) -------------------------------------------------------------------------------- Pain Assessment Details Patient Name: [name], [name] Date of Service: 06/18/2021 10:15 AM Medical Record Number: [id number] Patient Account Number: [id number] Date of Birth/Sex: 03-30-1961 (60 y.o. Male) Treating RN: Dolan Amen Primary Care Kennice Finnie: Halina Maidens Other Clinician: Referring Marika Mahaffy: Margarette Canada Treating Krisi Azua/Extender: Skipper Cliche in Treatment: 0 Active Problems Location of Pain Severity and Description of Pain Patient Has Paino Yes Site Locations Pain Location: Pain in Ulcers Rate the pain. Current Pain Level: 6 Pain Management and Medication Current Pain Management: Electronic Signature(s) Signed: 06/18/2021 4:29:12 PM By: Georges Mouse,  Minus Breeding RN Entered By: Georges Mouse, Kenia on 06/18/2021 10:33:23 [name] ([id number]) -------------------------------------------------------------------------------- Patient/Caregiver Education Details Patient Name: [name] Date of Service: 06/18/2021 10:15 AM Medical Record Number: [id number] Patient Account Number: [id number] Date of Birth/Gender: 1961/09/27 (60 y.o. Male) Treating RN: Carlene Coria Primary Care Physician: Halina Maidens Other Clinician: Referring Physician: Margarette Canada Treating Physician/Extender: Skipper Cliche in Treatment: 0 Education Assessment Education Provided To: Patient Education Topics Provided Wound/Skin Impairment: Methods: Explain/Verbal Responses: State content correctly Electronic Signature(s) Signed: 06/18/2021 4:44:14 PM By: Carlene Coria RN Entered By: Carlene Coria on 06/18/2021 11:39:44 [name] ([id number]) -------------------------------------------------------------------------------- Wound Assessment Details Patient Name: [name] Date of Service: 06/18/2021 10:15 AM Medical Record Number: [id number] Patient Account Number: [id number] Date of Birth/Sex: 06/05/1961 (60 y.o. Male) Treating RN: Dolan Amen Primary Care Yosef Krogh: Halina Maidens Other Clinician: Referring Carlos Quackenbush: Margarette Canada Treating Sebastion Jun/Extender: Jeri Cos Weeks in Treatment: 0 Wound Status Wound Number: 1 Primary Etiology: Trauma, Other Wound Location: Right, Lateral Malleolus Wound Status: Open Wounding Event: Trauma Comorbid History: Hypertension Date Acquired: 05/09/2021 Weeks Of Treatment: 0 Clustered Wound: No Photos Wound Measurements Length: (cm) 1 Width: (cm) 1 Depth: (cm) 0.5 Area: (cm) 0.785 Volume: (cm) 0.393 % Reduction in Area: % Reduction in Volume: Epithelialization: None Tunneling: No Undermining: Yes Starting Position (o'clock): 1 Ending Position (o'clock): 5 Maximum Distance: (cm)  0.4 Wound Description Classification: Full Thickness Without Exposed Support Structu Exudate Amount: Medium Exudate Type: Serosanguineous Exudate Color: red, brown res Foul Odor After Cleansing: No Slough/Fibrino Yes Wound Bed Granulation Amount: None Present (0%) Exposed Structure Necrotic Amount: Large (67-100%) Fascia Exposed: No Necrotic Quality: Adherent Slough Fat Layer (Subcutaneous Tissue) Exposed: Yes Tendon Exposed: No Muscle Exposed: No Joint Exposed: No Bone Exposed: No Electronic Signature(s) Signed: 06/18/2021 4:29:12 PM By: Georges Mouse, Minus Breeding RN Entered By: Georges Mouse, Kenia on 06/18/2021 10:46:13 [name] ([id number]) -------------------------------------------------------------------------------- Vitals Details Patient Name: [name] Date of Service: 06/18/2021 10:15 AM Medical Record Number: [id number] Patient Account Number: [id number] Date of Birth/Sex: 07-Jul-1961 (60 y.o. Male) Treating RN: Dolan Amen Primary Care Kawena Lyday: Halina Maidens Other Clinician: Referring Angelo Caroll: Margarette Canada Treating Shaquira Moroz/Extender: Skipper Cliche in Treatment: 0 Vital Signs Time Taken: 10:33 Temperature (F): 98.1 Height (in): 66 Pulse (bpm): 61 Source: Stated Respiratory Rate (breaths/min): 18 Weight (lbs): 161 Blood Pressure (mmHg): 124/66 Source: Measured Reference Range: 80 - 120 mg / dl Body Mass Index (BMI): 26 Electronic Signature(s) Signed: 06/18/2021 4:29:12 PM By: Georges Mouse, Minus Breeding RN Entered By: Georges Mouse, Minus Breeding on 06/18/2021 10:33:51

## 2021-06-19 NOTE — Progress Notes (Signed)
MOATAZ, TAVIS ([id number]) Visit Report for 06/18/2021 Chief Complaint Document Details Patient Name: [name], [name]. Date of Service: 06/18/2021 10:15 AM Medical Record Number: [id number] Patient Account Number: [id number] Date of Birth/Sex: 1961-12-15 (60 y.o. Male) Treating RN: Carlene Coria Primary Care Provider: Halina Maidens Other Clinician: Referring Provider: Margarette Canada Treating Provider/Extender: Skipper Cliche in Treatment: 0 Information Obtained from: Patient Chief Complaint Right ankle laceration Electronic Signature(s) Signed: 06/18/2021 11:06:15 AM By: Worthy Keeler PA-C Entered By: Worthy Keeler on 06/18/2021 11:06:14 [name] ([id number]) -------------------------------------------------------------------------------- Debridement Details Patient Name: [name] Date of Service: 06/18/2021 10:15 AM Medical Record Number: [id number] Patient Account Number: [id number] Date of Birth/Sex: [birth year] (60 y.o. Male) Treating RN: Carlene Coria Primary Care Provider: Halina Maidens Other Clinician: Referring Provider: Margarette Canada Treating Provider/Extender: Skipper Cliche in Treatment: 0 Debridement Performed for Wound #1 Right,Lateral Malleolus Assessment: Performed By: Physician Tommie Sams., PA-C Debridement Type: Debridement Level of Consciousness (Pre- Awake and Alert procedure): Pre-procedure Verification/Time Out Yes - 11:34 Taken: Start Time: 11:34 Pain Control: Lidocaine 4% Topical Solution Total Area Debrided (L x W): 1 (cm) x 1 (cm) = 1 (cm) Tissue and other material Viable, Non-Viable, Slough, Subcutaneous, Skin: Dermis , Skin: Epidermis, Slough debrided: Level: Skin/Subcutaneous Tissue Debridement Description: Excisional Instrument: Curette Bleeding: Minimum Hemostasis Achieved: Pressure End Time: 11:36 Procedural Pain: 0 Post Procedural Pain: 0 Response to Treatment: Procedure was tolerated well Level of  Consciousness (Post- Awake and Alert procedure): Post Debridement Measurements of Total Wound Length: (cm) 1 Width: (cm) 1 Depth: (cm) 0.5 Volume: (cm) 0.393 Character of Wound/Ulcer Post Debridement: Improved Post Procedure Diagnosis Same as Pre-procedure Electronic Signature(s) Signed: 06/18/2021 4:44:14 PM By: Carlene Coria RN Signed: 06/18/2021 6:06:17 PM By: Worthy Keeler PA-C Entered By: Carlene Coria on 06/18/2021 11:35:22 [name] ([id number]) -------------------------------------------------------------------------------- HPI Details Patient Name: [name] Date of Service: 06/18/2021 10:15 AM Medical Record Number: [id number] Patient Account Number: [id number] Date of Birth/Sex: Oct 15, 1961 (60 y.o. Male) Treating RN: Carlene Coria Primary Care Provider: Halina Maidens Other Clinician: Referring Provider: Margarette Canada Treating Provider/Extender: Skipper Cliche in Treatment: 0 History of Present Illness HPI Description: 06/14/2021 upon evaluation today patient appears to be doing somewhat poorly in regard to the wound on his right ankle region. This was an injury that apparently occurred at work according to what he is telling me today. He is seen with his wife in the office at this point. Nonetheless the patient appears to have a circular opening with necrotic tissue in the base of the wound. This does not appear to be doing nearly as good as I would hope. Fortunately there does not appear to be any signs of infection though he does have a significant amount of necrotic tissue this is going to take some time to heal. He also has previously had surgery in this area there is a lot of scar tissue that will again complicate things and extended healing process. The patient does have hypertension otherwise no major medical problems. Electronic Signature(s) Signed: 06/18/2021 5:39:38 PM By: Worthy Keeler PA-C Entered By: Worthy Keeler on 06/18/2021  17:39:38 [name] ([id number]) -------------------------------------------------------------------------------- Physical Exam Details Patient Name: [name] Date of Service: 06/18/2021 10:15 AM Medical Record Number: [id number] Patient Account Number: [id number] Date of Birth/Sex: [birth year] (60 y.o. Male) Treating RN: Carlene Coria Primary Care Provider: Halina Maidens Other Clinician: Referring Provider: Margarette Canada Treating Provider/Extender: Skipper Cliche in Treatment: 0 Constitutional sitting or  standing blood pressure is within target range for patient.. pulse regular and within target range for patient.Marland Kitchen respirations regular, non- labored and within target range for patient.Marland Kitchen temperature within target range for patient.. Well-nourished and well-hydrated in no acute distress. Eyes conjunctiva clear no eyelid edema noted. pupils equal round and reactive to light and accommodation. Ears, Nose, Mouth, and Throat no gross abnormality of ear auricles or external auditory canals. normal hearing noted during conversation. mucus membranes moist. Respiratory normal breathing without difficulty. Cardiovascular 2+ dorsalis pedis/posterior tibialis pulses. no clubbing, cyanosis, significant edema, <3 sec cap refill. Psychiatric this patient is able to make decisions and demonstrates good insight into disease process. Alert and Oriented x 3. pleasant and cooperative. Notes Patient's wound bed showed signs of good granulation epithelization at this point around some of the edge although he has a lot of necrotic tissue in the base of the wound. I did discuss sharp debridement with him today and I did attempt to remove as much as I could. I was able to get some of the necrotic tissue away but unfortunately he still has a significant amount of necrotic tissue noted in the base of the wound. I think there were probably have to use medication to try to soften this up and I  discussed that with the patient and his wife today. The good news is he does have good blood flow at 1.09 for the ABI on the side. Electronic Signature(s) Signed: 06/18/2021 5:40:24 PM By: Worthy Keeler PA-C Entered By: Worthy Keeler on 06/18/2021 17:40:24 [name] ([id number]) -------------------------------------------------------------------------------- Physician Orders Details Patient Name: [name] Date of Service: 06/18/2021 10:15 AM Medical Record Number: [id number] Patient Account Number: [id number] Date of Birth/Sex: 09/02/1961 (60 y.o. Male) Treating RN: Carlene Coria Primary Care Provider: Halina Maidens Other Clinician: Referring Provider: Margarette Canada Treating Provider/Extender: Skipper Cliche in Treatment: 0 Verbal / Phone Orders: No Diagnosis Coding ICD-10 Coding Code Description U23.536R Laceration without foreign body, right ankle, initial encounter L97.312 Non-pressure chronic ulcer of right ankle with fat layer exposed I10 Essential (primary) hypertension Follow-up Appointments o Return Appointment in 1 week. Bathing/ Shower/ Hygiene o May shower; gently cleanse wound with antibacterial soap, rinse and pat dry prior to dressing wounds Edema Control - Lymphedema / Segmental Compressive Device / Other o Elevate, Exercise Daily and Avoid Standing for Long Periods of Time. o Elevate legs to the level of the heart and pump ankles as often as possible o Elevate leg(s) parallel to the floor when sitting. Wound Treatment Wound #1 - Malleolus Wound Laterality: Right, Lateral Cleanser: Soap and Water 3 x Per Week/30 Days Discharge Instructions: Gently cleanse wound with antibacterial soap, rinse and pat dry prior to dressing wounds Primary Dressing: IODOFLEX 0.9% Cadexomer Iodine Pad 3 x Per Week/30 Days Discharge Instructions: Apply Iodoflex to wound bed only as directed. Secondary Dressing: Bordered Gauze Sterile-HBD 4x4 (in/in) 3 x  Per Week/30 Days Discharge Instructions: Cover wound with Bordered Guaze Sterile as directed Electronic Signature(s) Signed: 06/18/2021 4:44:14 PM By: Carlene Coria RN Signed: 06/18/2021 6:06:17 PM By: Worthy Keeler PA-C Entered By: Carlene Coria on 06/18/2021 11:38:02 [name] ([id number]) -------------------------------------------------------------------------------- Problem List Details Patient Name: [name] Date of Service: 06/18/2021 10:15 AM Medical Record Number: [id number] Patient Account Number: [id number] Date of Birth/Sex: [birth year] (60 y.o. Male) Treating RN: Carlene Coria Primary Care Provider: Halina Maidens Other Clinician: Referring Provider: Margarette Canada Treating Provider/Extender: Skipper Cliche in Treatment: 0 Active Problems ICD-10 Encounter  Code Description Active Date MDM Diagnosis S91.011A Laceration without foreign body, right ankle, initial encounter 06/18/2021 No Yes L97.312 Non-pressure chronic ulcer of right ankle with fat layer exposed 06/18/2021 No Yes I10 Essential (primary) hypertension 06/18/2021 No Yes Inactive Problems Resolved Problems Electronic Signature(s) Signed: 06/18/2021 11:05:56 AM By: Worthy Keeler PA-C Entered By: Worthy Keeler on 06/18/2021 11:05:56 [name] ([id number]) -------------------------------------------------------------------------------- Progress Note Details Patient Name: [name] Date of Service: 06/18/2021 10:15 AM Medical Record Number: [id number] Patient Account Number: [id number] Date of Birth/Sex: [birth year] (60 y.o. Male) Treating RN: Carlene Coria Primary Care Provider: Halina Maidens Other Clinician: Referring Provider: Margarette Canada Treating Provider/Extender: Skipper Cliche in Treatment: 0 Subjective Chief Complaint Information obtained from Patient Right ankle laceration History of Present Illness (HPI) 06/14/2021 upon evaluation today patient appears to be  doing somewhat poorly in regard to the wound on his right ankle region. This was an injury that apparently occurred at work according to what he is telling me today. He is seen with his wife in the office at this point. Nonetheless the patient appears to have a circular opening with necrotic tissue in the base of the wound. This does not appear to be doing nearly as good as I would hope. Fortunately there does not appear to be any signs of infection though he does have a significant amount of necrotic tissue this is going to take some time to heal. He also has previously had surgery in this area there is a lot of scar tissue that will again complicate things and extended healing process. The patient does have hypertension otherwise no major medical problems. Patient History Allergies No Known Drug Allergies Social History Never smoker, Marital Status - Married, Alcohol Use - Never, Drug Use - No History, Caffeine Use - Daily. Medical History Eyes Denies history of Cataracts, Glaucoma, Optic Neuritis Ear/Nose/Mouth/Throat Denies history of Chronic sinus problems/congestion, Middle ear problems Hematologic/Lymphatic Denies history of Anemia, Hemophilia, Human Immunodeficiency Virus, Lymphedema, Sickle Cell Disease Respiratory Denies history of Aspiration, Asthma, Chronic Obstructive Pulmonary Disease (COPD), Pneumothorax, Sleep Apnea, Tuberculosis Cardiovascular Patient has history of Hypertension Musculoskeletal Denies history of Gout, Rheumatoid Arthritis, Osteoarthritis, Osteomyelitis Neurologic Denies history of Dementia, Neuropathy, Quadriplegia, Paraplegia, Seizure Disorder Oncologic Denies history of Received Chemotherapy, Received Radiation Psychiatric Denies history of Anorexia/bulimia, Confinement Anxiety Medical And Surgical History Notes Cardiovascular hyperlipidemia Review of Systems (ROS) Constitutional Symptoms (General Health) Denies complaints or symptoms of Fatigue,  Fever, Chills, Marked Weight Change. Eyes Denies complaints or symptoms of Dry Eyes, Vision Changes, Glasses / Contacts. Ear/Nose/Mouth/Throat Denies complaints or symptoms of Difficult clearing ears, Sinusitis. Hematologic/Lymphatic Denies complaints or symptoms of Bleeding / Clotting Disorders, Human Immunodeficiency Virus. Respiratory Denies complaints or symptoms of Chronic or frequent coughs, Shortness of Breath. Cardiovascular Denies complaints or symptoms of Chest pain, LE edema. Gastrointestinal Denies complaints or symptoms of Frequent diarrhea, Nausea, Vomiting. Endocrine Denies complaints or symptoms of Hepatitis, Thyroid disease, Polydypsia (Excessive Thirst). Genitourinary Denies complaints or symptoms of Kidney failure/ Dialysis, Incontinence/dribbling. Immunological JOHNNY, GORTER ([id number]) Denies complaints or symptoms of Hives, Itching. Integumentary (Skin) Complains or has symptoms of Wounds. Denies complaints or symptoms of Bleeding or bruising tendency, Breakdown, Swelling. Musculoskeletal Denies complaints or symptoms of Muscle Pain, Muscle Weakness. Neurologic Denies complaints or symptoms of Numbness/parasthesias, Focal/Weakness. Psychiatric Denies complaints or symptoms of Anxiety, Claustrophobia. Objective Constitutional sitting or standing blood pressure is within target range for patient.. pulse regular and within target range for patient.Marland Kitchen respirations regular, non- labored and within  target range for patient.Marland Kitchen temperature within target range for patient.. Well-nourished and well-hydrated in no acute distress. Vitals Time Taken: 10:33 AM, Height: 66 in, Source: Stated, Weight: 161 lbs, Source: Measured, BMI: 26, Temperature: 98.1 F, Pulse: 61 bpm, Respiratory Rate: 18 breaths/min, Blood Pressure: 124/66 mmHg. Eyes conjunctiva clear no eyelid edema noted. pupils equal round and reactive to light and accommodation. Ears, Nose, Mouth, and  Throat no gross abnormality of ear auricles or external auditory canals. normal hearing noted during conversation. mucus membranes moist. Respiratory normal breathing without difficulty. Cardiovascular 2+ dorsalis pedis/posterior tibialis pulses. no clubbing, cyanosis, significant edema, Psychiatric this patient is able to make decisions and demonstrates good insight into disease process. Alert and Oriented x 3. pleasant and cooperative. General Notes: Patient's wound bed showed signs of good granulation epithelization at this point around some of the edge although he has a lot of necrotic tissue in the base of the wound. I did discuss sharp debridement with him today and I did attempt to remove as much as I could. I was able to get some of the necrotic tissue away but unfortunately he still has a significant amount of necrotic tissue noted in the base of the wound. I think there were probably have to use medication to try to soften this up and I discussed that with the patient and his wife today. The good news is he does have good blood flow at 1.09 for the ABI on the side. Integumentary (Hair, Skin) Wound #1 status is Open. Original cause of wound was Trauma. The date acquired was: 05/09/2021. The wound is located on the Right,Lateral Ankle. The wound measures 1cm length x 1cm width x 0.5cm depth; 0.785cm^2 area and 0.393cm^3 volume. There is Fat Layer (Subcutaneous Tissue) exposed. There is no tunneling noted, however, there is undermining starting at 1:00 and ending at 5:00 with a maximum distance of 0.4cm. There is a medium amount of serosanguineous drainage noted. There is no granulation within the wound bed. There is a large (67-100%) amount of necrotic tissue within the wound bed including Adherent Slough. Assessment Active Problems ICD-10 Laceration without foreign body, right ankle, initial encounter Non-pressure chronic ulcer of right ankle with fat layer exposed Essential (primary)  hypertension Procedures ARCENIO, MULLALY. ([id number]) Wound #1 Pre-procedure diagnosis of Wound #1 is a Trauma, Other located on the Right,Lateral Malleolus . There was a Excisional Skin/Subcutaneous Tissue Debridement with a total area of 1 sq cm performed by Tommie Sams., PA-C. With the following instrument(s): Curette to remove Viable and Non-Viable tissue/material. Material removed includes Subcutaneous Tissue, Slough, Skin: Dermis, and Skin: Epidermis after achieving pain control using Lidocaine 4% Topical Solution. No specimens were taken. A time out was conducted at 11:34, prior to the start of the procedure. A Minimum amount of bleeding was controlled with Pressure. The procedure was tolerated well with a pain level of 0 throughout and a pain level of 0 following the procedure. Post Debridement Measurements: 1cm length x 1cm width x 0.5cm depth; 0.393cm^3 volume. Character of Wound/Ulcer Post Debridement is improved. Post procedure Diagnosis Wound #1: Same as Pre-Procedure Plan Follow-up Appointments: Return Appointment in 1 week. Bathing/ Shower/ Hygiene: May shower; gently cleanse wound with antibacterial soap, rinse and pat dry prior to dressing wounds Edema Control - Lymphedema / Segmental Compressive Device / Other: Elevate, Exercise Daily and Avoid Standing for Long Periods of Time. Elevate legs to the level of the heart and pump ankles as often as possible Elevate leg(s) parallel to  the floor when sitting. WOUND #1: - Malleolus Wound Laterality: Right, Lateral Cleanser: Soap and Water 3 x Per Week/30 Days Discharge Instructions: Gently cleanse wound with antibacterial soap, rinse and pat dry prior to dressing wounds Primary Dressing: IODOFLEX 0.9% Cadexomer Iodine Pad 3 x Per Week/30 Days Discharge Instructions: Apply Iodoflex to wound bed only as directed. Secondary Dressing: Bordered Gauze Sterile-HBD 4x4 (in/in) 3 x Per Week/30 Days Discharge Instructions: Cover wound  with Bordered Guaze Sterile as directed 1. Would recommend currently that we initiate treatment with Iodoflex I think this is good to be the best way to go and the patient and his wife are in agreement with plan. 2. Also can recommend he changes 3 times a week covering with a border gauze dressing. 3. He can also clean this with soap and water I think Dial antibacterial soap would be good in this case. We will see patient back for reevaluation in 1 week here in the clinic. If anything worsens or changes patient will contact our office for additional recommendations. Electronic Signature(s) Signed: 06/18/2021 5:40:47 PM By: Worthy Keeler PA-C Entered By: Worthy Keeler on 06/18/2021 17:40:47 [name] ([id number]) -------------------------------------------------------------------------------- ROS/PFSH Details Patient Name: [name] Date of Service: 06/18/2021 10:15 AM Medical Record Number: [id number] Patient Account Number: [id number] Date of Birth/Sex: 08-19-1961 (59 y.o. Male) Treating RN: Dolan Amen Primary Care Provider: Halina Maidens Other Clinician: Referring Provider: Margarette Canada Treating Provider/Extender: Skipper Cliche in Treatment: 0 Constitutional Symptoms (General Health) Complaints and Symptoms: Negative for: Fatigue; Fever; Chills; Marked Weight Change Eyes Complaints and Symptoms: Negative for: Dry Eyes; Vision Changes; Glasses / Contacts Medical History: Negative for: Cataracts; Glaucoma; Optic Neuritis Ear/Nose/Mouth/Throat Complaints and Symptoms: Negative for: Difficult clearing ears; Sinusitis Medical History: Negative for: Chronic sinus problems/congestion; Middle ear problems Hematologic/Lymphatic Complaints and Symptoms: Negative for: Bleeding / Clotting Disorders; Human Immunodeficiency Virus Medical History: Negative for: Anemia; Hemophilia; Human Immunodeficiency Virus; Lymphedema; Sickle Cell  Disease Respiratory Complaints and Symptoms: Negative for: Chronic or frequent coughs; Shortness of Breath Medical History: Negative for: Aspiration; Asthma; Chronic Obstructive Pulmonary Disease (COPD); Pneumothorax; Sleep Apnea; Tuberculosis Cardiovascular Complaints and Symptoms: Negative for: Chest pain; LE edema Medical History: Positive for: Hypertension Past Medical History Notes: hyperlipidemia Gastrointestinal Complaints and Symptoms: Negative for: Frequent diarrhea; Nausea; Vomiting Endocrine Complaints and Symptoms: Negative for: Hepatitis; Thyroid disease; Polydypsia (Excessive Thirst) Genitourinary HAMZEH, TALL ([id number]) Complaints and Symptoms: Negative for: Kidney failure/ Dialysis; Incontinence/dribbling Immunological Complaints and Symptoms: Negative for: Hives; Itching Integumentary (Skin) Complaints and Symptoms: Positive for: Wounds Negative for: Bleeding or bruising tendency; Breakdown; Swelling Musculoskeletal Complaints and Symptoms: Negative for: Muscle Pain; Muscle Weakness Medical History: Negative for: Gout; Rheumatoid Arthritis; Osteoarthritis; Osteomyelitis Neurologic Complaints and Symptoms: Negative for: Numbness/parasthesias; Focal/Weakness Medical History: Negative for: Dementia; Neuropathy; Quadriplegia; Paraplegia; Seizure Disorder Psychiatric Complaints and Symptoms: Negative for: Anxiety; Claustrophobia Medical History: Negative for: Anorexia/bulimia; Confinement Anxiety Oncologic Medical History: Negative for: Received Chemotherapy; Received Radiation Immunizations Pneumococcal Vaccine: Received Pneumococcal Vaccination: No Implantable Devices None Family and Social History Never smoker; Marital Status - Married; Alcohol Use: Never; Drug Use: No History; Caffeine Use: Daily Electronic Signature(s) Signed: 06/18/2021 4:29:12 PM By: Georges Mouse, Minus Breeding RN Signed: 06/18/2021 6:06:17 PM By: Worthy Keeler  PA-C Entered By: Georges Mouse, Minus Breeding on 06/18/2021 10:39:15 [name] ([id number]) -------------------------------------------------------------------------------- SuperBill Details Patient Name: [name] Date of Service: 06/18/2021 Medical Record Number: [id number] Patient Account Number: [id number] Date of Birth/Sex: 04/15/1961 (60 y.o. Male) Treating RN: Carlene Coria  Primary Care Provider: Halina Maidens Other Clinician: Referring Provider: Margarette Canada Treating Provider/Extender: Skipper Cliche in Treatment: 0 Diagnosis Coding ICD-10 Codes Code Description I71.959D Laceration without foreign body, right ankle, initial encounter I71.855 Non-pressure chronic ulcer of right ankle with fat layer exposed [place] (primary) hypertension Facility Procedures CPT4 Code: 01586825 Description: [place] VISIT-LEV 3 EST PT Modifier: Quantity: 1 CPT4 Code: [id number] Description: 74715 - DEB SUBQ TISSUE 20 SQ CM/< Modifier: Quantity: 1 CPT4 Code: Description: ICD-10 Diagnosis Description N53.967S Laceration without foreign body, right ankle, initial encounter Modifier: Quantity: Physician Procedures CPT4 Code: 8979150 Description: WC PHYS LEVEL 3 o NEW PT Modifier: 25 Quantity: 1 CPT4 Code: Description: ICD-10 Diagnosis Description C13.643I Laceration without foreign body, right ankle, initial encounter P77.939 Non-pressure chronic ulcer of right ankle with fat layer exposed I10 Essential (primary) hypertension Modifier: Quantity: CPT4 Code: 6886484 Description: 11042 - WC PHYS SUBQ TISS 20 SQ CM Modifier: Quantity: 1 CPT4 Code: Description: ICD-10 Diagnosis Description S91.011A Laceration without foreign body, right ankle, initial encounter Modifier: Quantity: Electronic Signature(s) Signed: 06/18/2021 5:41:03 PM By: Worthy Keeler PA-C Entered By: Worthy Keeler on 06/18/2021 17:41:03

## 2021-06-23 ENCOUNTER — Other Ambulatory Visit: Payer: Self-pay | Admitting: Urology

## 2021-06-25 ENCOUNTER — Encounter: Payer: Managed Care, Other (non HMO) | Attending: Physician Assistant | Admitting: Physician Assistant

## 2021-06-25 ENCOUNTER — Other Ambulatory Visit: Payer: Self-pay

## 2021-06-25 ENCOUNTER — Encounter: Payer: Self-pay | Admitting: Urology

## 2021-06-25 DIAGNOSIS — S91011D Laceration without foreign body, right ankle, subsequent encounter: Secondary | ICD-10-CM | POA: Insufficient documentation

## 2021-06-25 DIAGNOSIS — I10 Essential (primary) hypertension: Secondary | ICD-10-CM | POA: Insufficient documentation

## 2021-06-25 DIAGNOSIS — S91011A Laceration without foreign body, right ankle, initial encounter: Secondary | ICD-10-CM | POA: Diagnosis present

## 2021-06-25 DIAGNOSIS — L97312 Non-pressure chronic ulcer of right ankle with fat layer exposed: Secondary | ICD-10-CM | POA: Diagnosis not present

## 2021-06-25 DIAGNOSIS — X58XXXD Exposure to other specified factors, subsequent encounter: Secondary | ICD-10-CM | POA: Insufficient documentation

## 2021-06-25 NOTE — Progress Notes (Addendum)
VIR, WHETSTINE ([id number]) Visit Report for 06/25/2021 Chief Complaint Document Details Patient Name: [name] Date of Service: 06/25/2021 11:15 AM Medical Record Number: [id number] Patient Account Number: [id number] Date of Birth/Sex: [birth year] (60 y.o. M) Treating RN: Carlene Coria Primary Care Provider: Halina Maidens Other Clinician: Referring Provider: Halina Maidens Treating Provider/Extender: Skipper Cliche in Treatment: 1 Information Obtained from: Patient Chief Complaint Right ankle laceration Electronic Signature(s) Signed: 06/25/2021 11:51:21 AM By: Worthy Keeler PA-C Entered By: Worthy Keeler on 06/25/2021 11:51:21 [name] ([id number]) -------------------------------------------------------------------------------- Debridement Details Patient Name: [name] Date of Service: 06/25/2021 11:15 AM Medical Record Number: [id number] Patient Account Number: [id number] Date of Birth/Sex: Jul 27, 1961 (60 y.o. M) Treating RN: Dolan Amen Primary Care Provider: Halina Maidens Other Clinician: Referring Provider: Halina Maidens Treating Provider/Extender: Jeri Cos Weeks in Treatment: 1 Debridement Performed for Wound #1 Right,Lateral Ankle Assessment: Performed By: Physician Tommie Sams., PA-C Debridement Type: Debridement Level of Consciousness (Pre- Awake and Alert procedure): Pre-procedure Verification/Time Out Yes - 11:53 Taken: Start Time: 11:53 Total Area Debrided (L x W): 1 (cm) x 1.2 (cm) = 1.2 (cm) Tissue and other material Viable, Non-Viable, Slough, Subcutaneous, Slough debrided: Level: Skin/Subcutaneous Tissue Debridement Description: Excisional Instrument: Curette Bleeding: Minimum Hemostasis Achieved: Pressure Response to Treatment: Procedure was tolerated well Level of Consciousness (Post- Awake and Alert procedure): Post Debridement Measurements of Total Wound Length: (cm) 1 Width: (cm) 1.2 Depth: (cm)  0.6 Volume: (cm) 0.565 Character of Wound/Ulcer Post Debridement: Stable Post Procedure Diagnosis Same as Pre-procedure Electronic Signature(s) Signed: 06/25/2021 3:39:58 PM By: Dolan Amen RN Signed: 06/25/2021 4:09:56 PM By: Worthy Keeler PA-C Entered By: Dolan Amen on 06/25/2021 11:53:33 [name] ([id number]) -------------------------------------------------------------------------------- HPI Details Patient Name: [name] Date of Service: 06/25/2021 11:15 AM Medical Record Number: [id number] Patient Account Number: [id number] Date of Birth/Sex: 15-Sep-1961 (60 y.o. M) Treating RN: Carlene Coria Primary Care Provider: Halina Maidens Other Clinician: Referring Provider: Halina Maidens Treating Provider/Extender: Skipper Cliche in Treatment: 1 History of Present Illness HPI Description: 06/14/2021 upon evaluation today patient appears to be doing somewhat poorly in regard to the wound on his right ankle region. This was an injury that apparently occurred at work according to what he is telling me today. He is seen with his wife in the office at this point. Nonetheless the patient appears to have a circular opening with necrotic tissue in the base of the wound. This does not appear to be doing nearly as good as I would hope. Fortunately there does not appear to be any signs of infection though he does have a significant amount of necrotic tissue this is going to take some time to heal. He also has previously had surgery in this area there is a lot of scar tissue that will again complicate things and extended healing process. The patient does have hypertension otherwise no major medical problems. 06/25/2021 upon evaluation today patient presents for reevaluation here in our clinic he actually does seem to be doing better in regard to his wound on the right lateral ankle. He has been tolerating the dressing changes without complication. Fortunately I do not see any  signs of infection which is great news. Electronic Signature(s) Signed: 06/25/2021 12:59:41 PM By: Worthy Keeler PA-C Entered By: Worthy Keeler on 06/25/2021 12:59:41 [name] ([id number]) -------------------------------------------------------------------------------- Physical Exam Details Patient Name: [name] Date of Service: 06/25/2021 11:15 AM Medical Record Number: [id number] Patient Account Number: [id number]  Date of Birth/Sex: [birth year] (60 y.o. M) Treating RN: Carlene Coria Primary Care Provider: Halina Maidens Other Clinician: Referring Provider: Halina Maidens Treating Provider/Extender: Jeri Cos Weeks in Treatment: 1 Constitutional Well-nourished and well-hydrated in no acute distress. Respiratory normal breathing without difficulty. Psychiatric this patient is able to make decisions and demonstrates good insight into disease process. Alert and Oriented x 3. pleasant and cooperative. Notes Upon inspection patient's wound bed actually showed signs of good granulation epithelization at this point. There does not appear to be any evidence of infection which is great and overall I am extremely pleased with where things stand today. Electronic Signature(s) Signed: 06/25/2021 12:59:55 PM By: Worthy Keeler PA-C Entered By: Worthy Keeler on 06/25/2021 12:59:55 [name] ([id number]) -------------------------------------------------------------------------------- Physician Orders Details Patient Name: [name] Date of Service: 06/25/2021 11:15 AM Medical Record Number: [id number] Patient Account Number: [id number] Date of Birth/Sex: August 06, 1961 (60 y.o. M) Treating RN: Dolan Amen Primary Care Provider: Halina Maidens Other Clinician: Referring Provider: Halina Maidens Treating Provider/Extender: Skipper Cliche in Treatment: 1 Verbal / Phone Orders: No Diagnosis Coding ICD-10 Coding Code Description N46.270J Laceration  without foreign body, right ankle, initial encounter L97.312 Non-pressure chronic ulcer of right ankle with fat layer exposed [place] (primary) hypertension Follow-up Appointments o Return Appointment in 1 week. Bathing/ Shower/ Hygiene o May shower; gently cleanse wound with antibacterial soap, rinse and pat dry prior to dressing wounds Edema Control - Lymphedema / Segmental Compressive Device / Other o Elevate, Exercise Daily and Avoid Standing for Long Periods of Time. o Elevate legs to the level of the heart and pump ankles as often as possible o Elevate leg(s) parallel to the floor when sitting. Wound Treatment Wound #1 - Ankle Wound Laterality: Right, Lateral Cleanser: Soap and Water 3 x Per Week/30 Days Discharge Instructions: Gently cleanse wound with antibacterial soap, rinse and pat dry prior to dressing wounds Primary Dressing: IODOFLEX 0.9% Cadexomer Iodine Pad 3 x Per Week/30 Days Discharge Instructions: Apply Iodoflex to wound bed only as directed. Secondary Dressing: Bordered Gauze Sterile-HBD 4x4 (in/in) 3 x Per Week/30 Days Discharge Instructions: Cover wound with Bordered Guaze Sterile as directed Electronic Signature(s) Signed: 06/25/2021 3:39:58 PM By: Dolan Amen RN Signed: 06/25/2021 4:09:56 PM By: Worthy Keeler PA-C Entered By: Dolan Amen on 06/25/2021 11:53:58 [name] ([id number]) -------------------------------------------------------------------------------- Problem List Details Patient Name: [name] Date of Service: 06/25/2021 11:15 AM Medical Record Number: [id number] Patient Account Number: [id number] Date of Birth/Sex: 23-Feb-1961 (60 y.o. M) Treating RN: Carlene Coria Primary Care Provider: Halina Maidens Other Clinician: Referring Provider: Halina Maidens Treating Provider/Extender: Jeri Cos Weeks in Treatment: 1 Active Problems ICD-10 Encounter Code Description Active Date MDM Diagnosis S91.011A  Laceration without foreign body, right ankle, initial encounter 06/18/2021 No Yes L97.312 Non-pressure chronic ulcer of right ankle with fat layer exposed 06/18/2021 No Yes I10 Essential (primary) hypertension 06/18/2021 No Yes Inactive Problems Resolved Problems Electronic Signature(s) Signed: 06/25/2021 11:51:09 AM By: Worthy Keeler PA-C Entered By: Worthy Keeler on 06/25/2021 11:51:08 [name] ([id number]) -------------------------------------------------------------------------------- Progress Note Details Patient Name: [name] Date of Service: 06/25/2021 11:15 AM Medical Record Number: [id number] Patient Account Number: [id number] Date of Birth/Sex: 21-May-1961 (60 y.o. M) Treating RN: Carlene Coria Primary Care Provider: Halina Maidens Other Clinician: Referring Provider: Halina Maidens Treating Provider/Extender: Skipper Cliche in Treatment: 1 Subjective Chief Complaint Information obtained from Patient Right ankle laceration History of Present Illness (HPI) 06/14/2021 upon evaluation today  patient appears to be doing somewhat poorly in regard to the wound on his right ankle region. This was an injury that apparently occurred at work according to what he is telling me today. He is seen with his wife in the office at this point. Nonetheless the patient appears to have a circular opening with necrotic tissue in the base of the wound. This does not appear to be doing nearly as good as I would hope. Fortunately there does not appear to be any signs of infection though he does have a significant amount of necrotic tissue this is going to take some time to heal. He also has previously had surgery in this area there is a lot of scar tissue that will again complicate things and extended healing process. The patient does have hypertension otherwise no major medical problems. 06/25/2021 upon evaluation today patient presents for reevaluation here in our clinic he actually  does seem to be doing better in regard to his wound on the right lateral ankle. He has been tolerating the dressing changes without complication. Fortunately I do not see any signs of infection which is great news. Objective Constitutional Well-nourished and well-hydrated in no acute distress. Vitals Time Taken: 11:30 AM, Height: 66 in, Weight: 161 lbs, BMI: 26, Temperature: 98.3 F, Pulse: 71 bpm, Respiratory Rate: 18 breaths/min, Blood Pressure: 115/65 mmHg. Respiratory normal breathing without difficulty. Psychiatric this patient is able to make decisions and demonstrates good insight into disease process. Alert and Oriented x 3. pleasant and cooperative. General Notes: Upon inspection patient's wound bed actually showed signs of good granulation epithelization at this point. There does not appear to be any evidence of infection which is great and overall I am extremely pleased with where things stand today. Integumentary (Hair, Skin) Wound #1 status is Open. Original cause of wound was Trauma. The date acquired was: 05/09/2021. The wound has been in treatment 1 weeks. The wound is located on the Right,Lateral Ankle. The wound measures 1cm length x 1.2cm width x 0.5cm depth; 0.942cm^2 area and 0.471cm^3 volume. There is Fat Layer (Subcutaneous Tissue) exposed. There is no tunneling noted, however, there is undermining starting at 1:00 and ending at 5:00 with a maximum distance of 0.3cm. There is a medium amount of serosanguineous drainage noted. There is no granulation within the wound bed. There is a large (67-100%) amount of necrotic tissue within the wound bed including Adherent Slough. Assessment Active Problems ICD-10 Laceration without foreign body, right ankle, initial encounter Non-pressure chronic ulcer of right ankle with fat layer exposed Essential (primary) hypertension [name], HARTSELL. ([id number]) Procedures Wound #1 Pre-procedure diagnosis of Wound #1 is a Trauma,  Other located on the Right,Lateral Ankle . There was a Excisional Skin/Subcutaneous Tissue Debridement with a total area of 1.2 sq cm performed by Tommie Sams., PA-C. With the following instrument(s): Curette to remove Viable and Non-Viable tissue/material. Material removed includes Subcutaneous Tissue and Slough and. A time out was conducted at 11:53, prior to the start of the procedure. A Minimum amount of bleeding was controlled with Pressure. The procedure was tolerated well. Post Debridement Measurements: 1cm length x 1.2cm width x 0.6cm depth; 0.565cm^3 volume. Character of Wound/Ulcer Post Debridement is stable. Post procedure Diagnosis Wound #1: Same as Pre-Procedure Plan Follow-up Appointments: Return Appointment in 1 week. Bathing/ Shower/ Hygiene: May shower; gently cleanse wound with antibacterial soap, rinse and pat dry prior to dressing wounds Edema Control - Lymphedema / Segmental Compressive Device / Other: Elevate, Exercise Daily and Avoid  Standing for Long Periods of Time. Elevate legs to the level of the heart and pump ankles as often as possible Elevate leg(s) parallel to the floor when sitting. WOUND #1: - Ankle Wound Laterality: Right, Lateral Cleanser: Soap and Water 3 x Per Week/30 Days Discharge Instructions: Gently cleanse wound with antibacterial soap, rinse and pat dry prior to dressing wounds Primary Dressing: IODOFLEX 0.9% Cadexomer Iodine Pad 3 x Per Week/30 Days Discharge Instructions: Apply Iodoflex to wound bed only as directed. Secondary Dressing: Bordered Gauze Sterile-HBD 4x4 (in/in) 3 x Per Week/30 Days Discharge Instructions: Cover wound with Bordered Guaze Sterile as directed 1. Would recommend that we go ahead and continue with the wound care measures as before. I did debride the wound today to clear away some of the necrotic debris he tolerated that without complication postdebridement the wound bed does appear to be doing better. 2. I am also can  recommend that we continue with the Iodoflex which I feel like is also doing a good job. 3. I am also can recommend a border gauze dressing to cover I think this is appropriate. 4. We will consider possibly switching to [facility] or something like in the future depending on how things proceed he is in agreement with that plan. We will see patient back for reevaluation in 1 week here in the clinic. If anything worsens or changes patient will contact our office for additional recommendations. Electronic Signature(s) Signed: 06/25/2021 1:00:37 PM By: Worthy Keeler PA-C Entered By: Worthy Keeler on 06/25/2021 13:00:37 [name] ([id number]) -------------------------------------------------------------------------------- SuperBill Details Patient Name: [name] Date of Service: 06/25/2021 Medical Record Number: [id number] Patient Account Number: [id number] Date of Birth/Sex: 05/12/1961 (60 y.o. M) Treating RN: Dolan Amen Primary Care Provider: Halina Maidens Other Clinician: Referring Provider: Halina Maidens Treating Provider/Extender: Jeri Cos Weeks in Treatment: 1 Diagnosis Coding ICD-10 Codes Code Description [phone] Laceration without foreign body, right ankle, initial encounter C58.527 Non-pressure chronic ulcer of right ankle with fat layer exposed [place] (primary) hypertension Facility Procedures CPT4 Code: [id number] Description: 61443 - DEB SUBQ TISSUE 20 SQ CM/< Modifier: Quantity: 1 CPT4 Code: Description: ICD-10 Diagnosis Description X54.008 Non-pressure chronic ulcer of right ankle with fat layer exposed Modifier: Quantity: Physician Procedures CPT4 Code: 6761950 Description: 11042 - WC PHYS SUBQ TISS 20 SQ CM Modifier: Quantity: 1 CPT4 Code: Description: ICD-10 Diagnosis Description D32.671 Non-pressure chronic ulcer of right ankle with fat layer exposed Modifier: Quantity: Electronic Signature(s) Signed: 06/25/2021 1:00:47 PM  By: Worthy Keeler PA-C Entered By: Worthy Keeler on 06/25/2021 13:00:47

## 2021-06-25 NOTE — Progress Notes (Signed)
KEYONTAY, STOLZ ([id number]) Visit Report for 06/25/2021 Arrival Information Details Patient Name: [name], [name] Date of Service: 06/25/2021 11:15 AM Medical Record Number: [id number] Patient Account Number: [id number] Date of Birth/Sex: Mar 05, 1961 (60 y.o. M) Treating RN: Dolan Amen Primary Care Caira Poche: Halina Maidens Other Clinician: Referring Marcell Pfeifer: Halina Maidens Treating Theodore Virgin/Extender: Skipper Cliche in Treatment: 1 Visit Information History Since Last Visit Pain Present Now: No Patient Arrived: Ambulatory Arrival Time: 11:29 Accompanied By: wife Transfer Assistance: None Patient Identification Verified: Yes Secondary Verification Process Completed: Yes Electronic Signature(s) Signed: 06/25/2021 3:39:58 PM By: Dolan Amen RN Entered By: Dolan Amen on 06/25/2021 11:30:07 [name] ([id number]) -------------------------------------------------------------------------------- Clinic Level of Care Assessment Details Patient Name: [name] Date of Service: 06/25/2021 11:15 AM Medical Record Number: [id number] Patient Account Number: [id number] Date of Birth/Sex: [birth year] (59 y.o. M) Treating RN: Dolan Amen Primary Care Harith Mccadden: Halina Maidens Other Clinician: Referring Dominic Mahaney: Halina Maidens Treating Nakyia Dau/Extender: Skipper Cliche in Treatment: 1 Clinic Level of Care Assessment Items TOOL 1 Quantity Score []  - Use when EandM and Procedure is performed on INITIAL visit 0 ASSESSMENTS - Nursing Assessment / Reassessment []  - General Physical Exam (combine w/ comprehensive assessment (listed just below) when performed on new 0 pt. evals) []  - 0 Comprehensive Assessment (HX, ROS, Risk Assessments, Wounds Hx, etc.) ASSESSMENTS - Wound and Skin Assessment / Reassessment []  - Dermatologic / Skin Assessment (not related to wound area) 0 ASSESSMENTS - Ostomy and/or Continence Assessment and Care []  - Incontinence Assessment  and Management 0 []  - 0 Ostomy Care Assessment and Management (repouching, etc.) PROCESS - Coordination of Care []  - Simple Patient / Family Education for ongoing care 0 []  - 0 Complex (extensive) Patient / Family Education for ongoing care []  - 0 Staff obtains Programmer, systems, Records, Test Results / Process Orders []  - 0 Staff telephones HHA, Nursing Homes / Clarify orders / etc []  - 0 Routine Transfer to another Facility (non-emergent condition) []  - 0 Routine Hospital Admission (non-emergent condition) []  - 0 New Admissions / Biomedical engineer / Ordering NPWT, Apligraf, etc. []  - 0 Emergency Hospital Admission (emergent condition) PROCESS - Special Needs []  - Pediatric / Minor Patient Management 0 []  - 0 Isolation Patient Management []  - 0 Hearing / Language / Visual special needs []  - 0 Assessment of Community assistance (transportation, D/C planning, etc.) []  - 0 Additional assistance / Altered mentation []  - 0 Support Surface(s) Assessment (bed, cushion, seat, etc.) INTERVENTIONS - Miscellaneous []  - External ear exam 0 []  - 0 Patient Transfer (multiple staff / Civil Service fast streamer / Similar devices) []  - 0 Simple Staple / Suture removal (25 or less) []  - 0 Complex Staple / Suture removal (26 or more) []  - 0 Hypo/Hyperglycemic Management (do not check if billed separately) []  - 0 Ankle / Brachial Index (ABI) - do not check if billed separately Has the patient been seen at the hospital within the last three years: Yes Total Score: 0 Level Of Care: ____ [name] ([id number]) Electronic Signature(s) Signed: 06/25/2021 3:39:58 PM By: Dolan Amen RN Entered By: Dolan Amen on 06/25/2021 11:54:10 [name] ([id number]) -------------------------------------------------------------------------------- Encounter Discharge Information Details Patient Name: [name] Date of Service: 06/25/2021 11:15 AM Medical Record Number: [id number] Patient  Account Number: [id number] Date of Birth/Sex: 17-Nov-1961 (60 y.o. M) Treating RN: Dolan Amen Primary Care Neviah Braud: Halina Maidens Other Clinician: Referring Josalyn Dettmann: Halina Maidens Treating Armando Lauman/Extender: Skipper Cliche in Treatment: 1 Encounter Discharge  Information Items Post Procedure Vitals Discharge Condition: Stable Temperature (F): 98.3 Ambulatory Status: Ambulatory Pulse (bpm): 71 Discharge Destination: Home Respiratory Rate (breaths/min): 18 Transportation: Private Auto Blood Pressure (mmHg): 115/65 Accompanied By: wife Schedule Follow-up Appointment: Yes Clinical Summary of Care: Electronic Signature(s) Signed: 06/25/2021 3:39:58 PM By: Dolan Amen RN Entered By: Dolan Amen on 06/25/2021 11:58:17 [name] ([id number]) -------------------------------------------------------------------------------- Lower Extremity Assessment Details Patient Name: [name] Date of Service: 06/25/2021 11:15 AM Medical Record Number: [id number] Patient Account Number: [id number] Date of Birth/Sex: 05-16-1961 (60 y.o. M) Treating RN: Dolan Amen Primary Care Jaysha Lasure: Halina Maidens Other Clinician: Referring Bobie Caris: Halina Maidens Treating Ymani Porcher/Extender: Jeri Cos Weeks in Treatment: 1 Edema Assessment Assessed: [Left: No] Patrice Paradise: Yes] Edema: [Left: N] [Right: o] Vascular Assessment Pulses: Dorsalis Pedis Palpable: [Right:Yes] Electronic Signature(s) Signed: 06/25/2021 3:39:58 PM By: Dolan Amen RN Entered By: Dolan Amen on 06/25/2021 11:38:31 [name] ([id number]) -------------------------------------------------------------------------------- Multi Wound Chart Details Patient Name: [name] Date of Service: 06/25/2021 11:15 AM Medical Record Number: [id number] Patient Account Number: [id number] Date of Birth/Sex: May 04, 1961 (60 y.o. M) Treating RN: Dolan Amen Primary Care Nimisha Rathel: Halina Maidens  Other Clinician: Referring Derrek Puff: Halina Maidens Treating Myreon Wimer/Extender: Jeri Cos Weeks in Treatment: 1 Vital Signs Height(in): 66 Pulse(bpm): 71 Weight(lbs): 161 Blood Pressure(mmHg): 115/65 Body Mass Index(BMI): 26 Temperature(F): 98.3 Respiratory Rate(breaths/min): 18 Photos: [N/A:N/A] Wound Location: Right, Lateral Ankle N/A N/A Wounding Event: Trauma N/A N/A Primary Etiology: Trauma, Other N/A N/A Comorbid History: Hypertension N/A N/A Date Acquired: 05/09/2021 N/A N/A Weeks of Treatment: 1 N/A N/A Wound Status: Open N/A N/A Measurements L x W x D (cm) 1x1.2x0.5 N/A N/A Area (cm) : 0.942 N/A N/A Volume (cm) : 0.471 N/A N/A % Reduction in Area: -20.00% N/A N/A % Reduction in Volume: -19.80% N/A N/A Starting Position 1 (o'clock): 1 Ending Position 1 (o'clock): 5 Maximum Distance 1 (cm): 0.3 Undermining: Yes N/A N/A Classification: Full Thickness Without Exposed N/A N/A Support Structures Exudate Amount: Medium N/A N/A Exudate Type: Serosanguineous N/A N/A Exudate Color: red, brown N/A N/A Granulation Amount: None Present (0%) N/A N/A Necrotic Amount: Large (67-100%) N/A N/A Exposed Structures: Fat Layer (Subcutaneous Tissue): N/A N/A Yes Fascia: No Tendon: No Muscle: No Joint: No Bone: No Epithelialization: None N/A N/A Treatment Notes Electronic Signature(s) Signed: 06/25/2021 3:39:58 PM By: Dolan Amen RN Entered By: Dolan Amen on 06/25/2021 11:52:24 [name] ([id number]) -------------------------------------------------------------------------------- [place] Details Patient Name: [name] Date of Service: 06/25/2021 11:15 AM Medical Record Number: [id number] Patient Account Number: [id number] Date of Birth/Sex: 1961/03/30 (60 y.o. M) Treating RN: Dolan Amen Primary Care Sariya Trickey: Halina Maidens Other Clinician: Referring Indiana Gamero: Halina Maidens Treating Kimarion Chery/Extender: Jeri Cos Weeks in Treatment: 1 Active Inactive Wound/Skin Impairment Nursing Diagnoses: Knowledge deficit related to ulceration/compromised skin integrity Goals: Patient/caregiver will verbalize understanding of skin care regimen Date Initiated: 06/18/2021 Target Resolution Date: 07/18/2021 Goal Status: Active Ulcer/skin breakdown will have a volume reduction of 30% by week 4 Date Initiated: 06/18/2021 Target Resolution Date: 07/18/2021 Goal Status: Active Ulcer/skin breakdown will have a volume reduction of 50% by week 8 Date Initiated: 06/18/2021 Target Resolution Date: 08/18/2021 Goal Status: Active Ulcer/skin breakdown will have a volume reduction of 80% by week 12 Date Initiated: 06/18/2021 Target Resolution Date: 09/18/2021 Goal Status: Active Ulcer/skin breakdown will heal within 14 weeks Date Initiated: 06/18/2021 Target Resolution Date: 10/18/2021 Goal Status: Active Interventions: Assess patient/caregiver ability to obtain necessary supplies Assess patient/caregiver ability to  perform ulcer/skin care regimen upon admission and as needed Assess ulceration(s) every visit Notes: Electronic Signature(s) Signed: 06/25/2021 3:39:58 PM By: Dolan Amen RN Entered By: Dolan Amen on 06/25/2021 11:52:16 [name] ([id number]) -------------------------------------------------------------------------------- Pain Assessment Details Patient Name: [name] Date of Service: 06/25/2021 11:15 AM Medical Record Number: [id number] Patient Account Number: [id number] Date of Birth/Sex: 10/05/1961 (60 y.o. M) Treating RN: Dolan Amen Primary Care Vanellope Passmore: Halina Maidens Other Clinician: Referring Lakresha Stifter: Halina Maidens Treating Kyley Solow/Extender: Jeri Cos Weeks in Treatment: 1 Active Problems Location of Pain Severity and Description of Pain Patient Has Paino No Site Locations Rate the pain. Current Pain Level: 0 Pain Management and Medication Current Pain  Management: Electronic Signature(s) Signed: 06/25/2021 3:39:58 PM By: Dolan Amen RN Entered By: Dolan Amen on 06/25/2021 11:32:13 [name] ([id number]) -------------------------------------------------------------------------------- Patient/Caregiver Education Details Patient Name: [name] Date of Service: 06/25/2021 11:15 AM Medical Record Number: [id number] Patient Account Number: [id number] Date of Birth/Gender: 1961-09-17 (60 y.o. M) Treating RN: Dolan Amen Primary Care Physician: Halina Maidens Other Clinician: Referring Physician: Halina Maidens Treating Physician/Extender: Skipper Cliche in Treatment: 1 Education Assessment Education Provided To: Patient Education Topics Provided Wound Debridement: Methods: Explain/Verbal Responses: State content correctly Wound/Skin Impairment: Methods: Explain/Verbal Responses: State content correctly Electronic Signature(s) Signed: 06/25/2021 3:39:58 PM By: Dolan Amen RN Entered By: Dolan Amen on 06/25/2021 11:54:27 [name] ([id number]) -------------------------------------------------------------------------------- Wound Assessment Details Patient Name: [name] Date of Service: 06/25/2021 11:15 AM Medical Record Number: [id number] Patient Account Number: [id number] Date of Birth/Sex: [birth year] (60 y.o. M) Treating RN: Dolan Amen Primary Care Torey Regan: Halina Maidens Other Clinician: Referring Cheria Sadiq: Halina Maidens Treating Francelia Mclaren/Extender: Jeri Cos Weeks in Treatment: 1 Wound Status Wound Number: 1 Primary Etiology: Trauma, Other Wound Location: Right, Lateral Ankle Wound Status: Open Wounding Event: Trauma Comorbid History: Hypertension Date Acquired: 05/09/2021 Weeks Of Treatment: 1 Clustered Wound: No Photos Wound Measurements Length: (cm) 1 Width: (cm) 1.2 Depth: (cm) 0.5 Area: (cm) 0.942 Volume: (cm) 0.471 % Reduction in Area: -20% %  Reduction in Volume: -19.8% Epithelialization: None Tunneling: No Undermining: Yes Starting Position (o'clock): 1 Ending Position (o'clock): 5 Maximum Distance: (cm) 0.3 Wound Description Classification: Full Thickness Without Exposed Support Structu Exudate Amount: Medium Exudate Type: Serosanguineous Exudate Color: red, brown res Foul Odor After Cleansing: No Slough/Fibrino Yes Wound Bed Granulation Amount: None Present (0%) Exposed Structure Necrotic Amount: Large (67-100%) Fascia Exposed: No Necrotic Quality: Adherent Slough Fat Layer (Subcutaneous Tissue) Exposed: Yes Tendon Exposed: No Muscle Exposed: No Joint Exposed: No Bone Exposed: No Treatment Notes Wound #1 (Ankle) Wound Laterality: Right, Lateral Cleanser Soap and Water [name], [name] ([id number]) Discharge Instruction: Gently cleanse wound with antibacterial soap, rinse and pat dry prior to dressing wounds Peri-Wound Care Topical Primary Dressing IODOFLEX 0.9% Cadexomer Iodine Pad Discharge Instruction: Apply Iodoflex to wound bed only as directed. Secondary Dressing Bordered Gauze Sterile-HBD 4x4 (in/in) Discharge Instruction: Cover wound with Bordered Guaze Sterile as directed Secured With Compression Wrap Compression Stockings Add-Ons Electronic Signature(s) Signed: 06/25/2021 3:39:58 PM By: Dolan Amen RN Entered By: Dolan Amen on 06/25/2021 11:37:38 [name] ([id number]) -------------------------------------------------------------------------------- [place] Details Patient Name: [name] Date of Service: 06/25/2021 11:15 AM Medical Record Number: [id number] Patient Account Number: [id number] Date of Birth/Sex: 1961-08-07 (60 y.o. M) Treating RN: Dolan Amen Primary Care Kyan Yurkovich: Halina Maidens Other Clinician: Referring Bodey Frizell: Halina Maidens Treating Kallyn Demarcus/Extender: Jeri Cos Weeks in Treatment: 1 Vital Signs Time Taken: 11:30  Temperature (F):  98.3 Height (in): 66 Pulse (bpm): 71 Weight (lbs): 161 Respiratory Rate (breaths/min): 18 Body Mass Index (BMI): 26 Blood Pressure (mmHg): 115/65 Reference Range: 80 - 120 mg / dl Electronic Signature(s) Signed: 06/25/2021 3:39:58 PM By: Dolan Amen RN Entered By: Dolan Amen on 06/25/2021 11:31:55

## 2021-07-02 ENCOUNTER — Encounter: Payer: Managed Care, Other (non HMO) | Admitting: Physician Assistant

## 2021-07-02 ENCOUNTER — Other Ambulatory Visit: Payer: Self-pay

## 2021-07-02 DIAGNOSIS — L97312 Non-pressure chronic ulcer of right ankle with fat layer exposed: Secondary | ICD-10-CM | POA: Diagnosis not present

## 2021-07-02 NOTE — Progress Notes (Addendum)
RASMUS, PREUSSER ([id number]) Visit Report for 07/02/2021 Chief Complaint Document Details Patient Name: [name], [name]. Date of Service: 07/02/2021 10:30 AM Medical Record Number: [id number] Patient Account Number: [id number] Date of Birth/Sex: 01/05/1961 (59 y.o. M) Treating RN: Carlene Coria Primary Care Provider: Halina Maidens Other Clinician: Referring Provider: Halina Maidens Treating Provider/Extender: Skipper Cliche in Treatment: 2 Information Obtained from: Patient Chief Complaint Right ankle laceration Electronic Signature(s) Signed: 07/02/2021 10:15:58 AM By: Worthy Keeler PA-C Entered By: Worthy Keeler on 07/02/2021 10:15:58 [name] ([id number]) -------------------------------------------------------------------------------- Debridement Details Patient Name: [name] Date of Service: 07/02/2021 10:30 AM Medical Record Number: [id number] Patient Account Number: [id number] Date of Birth/Sex: 1961/10/30 (59 y.o. M) Treating RN: Donnamarie Poag Primary Care Provider: Halina Maidens Other Clinician: Referring Provider: Halina Maidens Treating Provider/Extender: Jeri Cos Weeks in Treatment: 2 Debridement Performed for Wound #1 Right,Lateral Ankle Assessment: Performed By: Physician Tommie Sams., PA-C Debridement Type: Debridement Level of Consciousness (Pre- Awake and Alert procedure): Pre-procedure Verification/Time Out Yes - 10:34 Taken: Start Time: 10:34 Pain Control: Lidocaine Total Area Debrided (L x W): 1 (cm) x 1 (cm) = 1 (cm) Tissue and other material Viable, Non-Viable, Slough, Subcutaneous, Biofilm, Slough debrided: Level: Skin/Subcutaneous Tissue Debridement Description: Excisional Instrument: Curette Bleeding: Minimum Hemostasis Achieved: Pressure End Time: 11:36 Response to Treatment: Procedure was tolerated well Level of Consciousness (Post- Awake and Alert procedure): Post Debridement Measurements of Total  Wound Length: (cm) 1 Width: (cm) 1 Depth: (cm) 0.5 Volume: (cm) 0.393 Character of Wound/Ulcer Post Debridement: Improved Post Procedure Diagnosis Same as Pre-procedure Electronic Signature(s) Signed: 07/02/2021 3:14:45 PM By: Donnamarie Poag Signed: 07/02/2021 5:03:48 PM By: Worthy Keeler PA-C Entered By: Donnamarie Poag on 07/02/2021 10:35:47 [name] ([id number]) -------------------------------------------------------------------------------- HPI Details Patient Name: [name] Date of Service: 07/02/2021 10:30 AM Medical Record Number: [id number] Patient Account Number: [id number] Date of Birth/Sex: September 19, 1961 (59 y.o. M) Treating RN: Carlene Coria Primary Care Provider: Halina Maidens Other Clinician: Referring Provider: Halina Maidens Treating Provider/Extender: Skipper Cliche in Treatment: 2 History of Present Illness HPI Description: 06/14/2021 upon evaluation today patient appears to be doing somewhat poorly in regard to the wound on his right ankle region. This was an injury that apparently occurred at work according to what he is telling me today. He is seen with his wife in the office at this point. Nonetheless the patient appears to have a circular opening with necrotic tissue in the base of the wound. This does not appear to be doing nearly as good as I would hope. Fortunately there does not appear to be any signs of infection though he does have a significant amount of necrotic tissue this is going to take some time to heal. He also has previously had surgery in this area there is a lot of scar tissue that will again complicate things and extended healing process. The patient does have hypertension otherwise no major medical problems. 06/25/2021 upon evaluation today patient presents for reevaluation here in our clinic he actually does seem to be doing better in regard to his wound on the right lateral ankle. He has been tolerating the dressing changes without  complication. Fortunately I do not see any signs of infection which is great news. 07/02/2021 upon evaluation today patient appears to be doing about the same in regard to his wound. With that being said I do believe that this is cleaner there was a little bit of slough noted we have been using Iodoflex. I  was able to see a little bit more granulation tissue starting to try to build up but I think the scar tissue here is going to be the biggest slowing factor with regard to healing. That was discussed with the patient and his wife today. Nonetheless I think we want to switch up things based on what I see at this point. Electronic Signature(s) Signed: 07/02/2021 10:51:02 AM By: Worthy Keeler PA-C Entered By: Worthy Keeler on 07/02/2021 10:51:02 [name] ([id number]) -------------------------------------------------------------------------------- Physical Exam Details Patient Name: [name] Date of Service: 07/02/2021 10:30 AM Medical Record Number: [id number] Patient Account Number: [id number] Date of Birth/Sex: 13-Sep-1961 (59 y.o. M) Treating RN: Carlene Coria Primary Care Provider: Halina Maidens Other Clinician: Referring Provider: Halina Maidens Treating Provider/Extender: Jeri Cos Weeks in Treatment: 2 Constitutional Well-nourished and well-hydrated in no acute distress. Respiratory normal breathing without difficulty. Psychiatric this patient is able to make decisions and demonstrates good insight into disease process. Alert and Oriented x 3. pleasant and cooperative. Notes Upon inspection patient's wound did require some sharp debridement to clear away some of the necrotic debris and again there was minimal noted today as compared to previous. He does have some scar tissue noted as well as connective tissue in general. I think this is can make it difficult to heal just because of the significant amount of scar tissue nonetheless I do not believe it cannot be  healed just that we will get have to be a little bit more aggressive with management all things considered. Electronic Signature(s) Signed: 07/02/2021 10:51:54 AM By: Worthy Keeler PA-C Entered By: Worthy Keeler on 07/02/2021 10:51:54 [name] ([id number]) -------------------------------------------------------------------------------- Physician Orders Details Patient Name: [name] Date of Service: 07/02/2021 10:30 AM Medical Record Number: [id number] Patient Account Number: [id number] Date of Birth/Sex: February 10, 1961 (59 y.o. M) Treating RN: Donnamarie Poag Primary Care Provider: Halina Maidens Other Clinician: Referring Provider: Halina Maidens Treating Provider/Extender: Skipper Cliche in Treatment: 2 Verbal / Phone Orders: No Diagnosis Coding ICD-10 Coding Code Description G29.528U Laceration without foreign body, right ankle, initial encounter L97.312 Non-pressure chronic ulcer of right ankle with fat layer exposed [place] (primary) hypertension Follow-up Appointments o Return Appointment in 1 week. Bathing/ Shower/ Hygiene o May shower; gently cleanse wound with antibacterial soap, rinse and pat dry prior to dressing wounds Edema Control - Lymphedema / Segmental Compressive Device / Other o Elevate, Exercise Daily and Avoid Standing for Long Periods of Time. o Elevate legs to the level of the heart and pump ankles as often as possible o Elevate leg(s) parallel to the floor when sitting. Wound Treatment Wound #1 - Ankle Wound Laterality: Right, Lateral Cleanser: Soap and Water 3 x Per Week/30 Days Discharge Instructions: Gently cleanse wound with antibacterial soap, rinse and pat dry prior to dressing wounds Primary Dressing: Prisma 4.34 (in) 3 x Per Week/30 Days Discharge Instructions: Moisten w/normal saline or sterile water; Cover wound as directed. Do not remove from wound bed. Secondary Dressing: Bordered Gauze Sterile-HBD 4x4 (in/in) 3  x Per Week/30 Days Discharge Instructions: Cover wound on top to secure with Bordered Guaze Sterile as directed Secondary Dressing: Foam Dressing, 4x4 (in/in) 3 x Per Week/30 Days Discharge Instructions: Bolster the collagen down with foam on top of it Electronic Signature(s) Signed: 07/02/2021 3:14:45 PM By: Donnamarie Poag Signed: 07/02/2021 5:03:48 PM By: Worthy Keeler PA-C Entered By: Donnamarie Poag on 07/02/2021 10:43:08 [name] ([id number]) -------------------------------------------------------------------------------- Problem List Details Patient Name: Ryan Bill  G. Date of Service: 07/02/2021 10:30 AM Medical Record Number: [id number] Patient Account Number: [id number] Date of Birth/Sex: August 30, 1961 (59 y.o. M) Treating RN: Carlene Coria Primary Care Provider: Halina Maidens Other Clinician: Referring Provider: Halina Maidens Treating Provider/Extender: Jeri Cos Weeks in Treatment: 2 Active Problems ICD-10 Encounter Code Description Active Date MDM Diagnosis S91.011A Laceration without foreign body, right ankle, initial encounter 06/18/2021 No Yes L97.312 Non-pressure chronic ulcer of right ankle with fat layer exposed 06/18/2021 No Yes I10 Essential (primary) hypertension 06/18/2021 No Yes Inactive Problems Resolved Problems Electronic Signature(s) Signed: 07/02/2021 10:15:52 AM By: Worthy Keeler PA-C Entered By: Worthy Keeler on 07/02/2021 10:15:52 [name] ([id number]) -------------------------------------------------------------------------------- Progress Note Details Patient Name: [name] Date of Service: 07/02/2021 10:30 AM Medical Record Number: [id number] Patient Account Number: [id number] Date of Birth/Sex: 1961-12-10 (59 y.o. M) Treating RN: Carlene Coria Primary Care Provider: Halina Maidens Other Clinician: Referring Provider: Halina Maidens Treating Provider/Extender: Skipper Cliche in Treatment: 2 Subjective Chief  Complaint Information obtained from Patient Right ankle laceration History of Present Illness (HPI) 06/14/2021 upon evaluation today patient appears to be doing somewhat poorly in regard to the wound on his right ankle region. This was an injury that apparently occurred at work according to what he is telling me today. He is seen with his wife in the office at this point. Nonetheless the patient appears to have a circular opening with necrotic tissue in the base of the wound. This does not appear to be doing nearly as good as I would hope. Fortunately there does not appear to be any signs of infection though he does have a significant amount of necrotic tissue this is going to take some time to heal. He also has previously had surgery in this area there is a lot of scar tissue that will again complicate things and extended healing process. The patient does have hypertension otherwise no major medical problems. 06/25/2021 upon evaluation today patient presents for reevaluation here in our clinic he actually does seem to be doing better in regard to his wound on the right lateral ankle. He has been tolerating the dressing changes without complication. Fortunately I do not see any signs of infection which is great news. 07/02/2021 upon evaluation today patient appears to be doing about the same in regard to his wound. With that being said I do believe that this is cleaner there was a little bit of slough noted we have been using Iodoflex. I was able to see a little bit more granulation tissue starting to try to build up but I think the scar tissue here is going to be the biggest slowing factor with regard to healing. That was discussed with the patient and his wife today. Nonetheless I think we want to switch up things based on what I see at this point. Objective Constitutional Well-nourished and well-hydrated in no acute distress. Vitals Time Taken: 10:12 AM, Height: 66 in, Weight: 161 lbs, BMI: 26,  Temperature: 97.9 F, Pulse: 64 bpm, Respiratory Rate: 16 breaths/min, Blood Pressure: 128/82 mmHg. Respiratory normal breathing without difficulty. Psychiatric this patient is able to make decisions and demonstrates good insight into disease process. Alert and Oriented x 3. pleasant and cooperative. General Notes: Upon inspection patient's wound did require some sharp debridement to clear away some of the necrotic debris and again there was minimal noted today as compared to previous. He does have some scar tissue noted as well as connective tissue in general. I think this is  can make it difficult to heal just because of the significant amount of scar tissue nonetheless I do not believe it cannot be healed just that we will get have to be a little bit more aggressive with management all things considered. Integumentary (Hair, Skin) Wound #1 status is Open. Original cause of wound was Trauma. The date acquired was: 05/09/2021. The wound has been in treatment 2 weeks. The wound is located on the Right,Lateral Ankle. The wound measures 1cm length x 1cm width x 0.5cm depth; 0.785cm^2 area and 0.393cm^3 volume. There is Fat Layer (Subcutaneous Tissue) exposed. There is no tunneling noted, however, there is undermining starting at 9:00 and ending at 9:00 with a maximum distance of 0.3cm. There is additional undermining and at 3:00 and ending at 3:00 with a maximum distance of 0.3cm. There is a medium amount of serosanguineous drainage noted. There is small (1-33%) red, pink granulation within the wound bed. There is a large (67-100%) amount of necrotic tissue within the wound bed including Adherent Slough. Assessment [name], [name] ([id number]) Active Problems ICD-10 Laceration without foreign body, right ankle, initial encounter Non-pressure chronic ulcer of right ankle with fat layer exposed [place] (primary) hypertension Procedures Wound #1 Pre-procedure diagnosis of Wound #1 is a  Trauma, Other located on the Right,Lateral Ankle . There was a Excisional Skin/Subcutaneous Tissue Debridement with a total area of 1 sq cm performed by Tommie Sams., PA-C. With the following instrument(s): Curette to remove Viable and Non-Viable tissue/material. Material removed includes Subcutaneous Tissue, Slough, and Biofilm after achieving pain control using Lidocaine. A time out was conducted at 10:34, prior to the start of the procedure. A Minimum amount of bleeding was controlled with Pressure. The procedure was tolerated well. Post Debridement Measurements: 1cm length x 1cm width x 0.5cm depth; 0.393cm^3 volume. Character of Wound/Ulcer Post Debridement is improved. Post procedure Diagnosis Wound #1: Same as Pre-Procedure Plan Follow-up Appointments: Return Appointment in 1 week. Bathing/ Shower/ Hygiene: May shower; gently cleanse wound with antibacterial soap, rinse and pat dry prior to dressing wounds Edema Control - Lymphedema / Segmental Compressive Device / Other: Elevate, Exercise Daily and Avoid Standing for Long Periods of Time. Elevate legs to the level of the heart and pump ankles as often as possible Elevate leg(s) parallel to the floor when sitting. WOUND #1: - Ankle Wound Laterality: Right, Lateral Cleanser: Soap and Water 3 x Per Week/30 Days Discharge Instructions: Gently cleanse wound with antibacterial soap, rinse and pat dry prior to dressing wounds Primary Dressing: Prisma 4.34 (in) 3 x Per Week/30 Days Discharge Instructions: Moisten w/normal saline or sterile water; Cover wound as directed. Do not remove from wound bed. Secondary Dressing: Bordered Gauze Sterile-HBD 4x4 (in/in) 3 x Per Week/30 Days Discharge Instructions: Cover wound on top to secure with Bordered Guaze Sterile as directed Secondary Dressing: Foam Dressing, 4x4 (in/in) 3 x Per Week/30 Days Discharge Instructions: Bolster the collagen down with foam on top of it 1. Would recommend currently  that we go ahead and initiate treatment with collagen working to see how things do in regard to this. 2. I am also can recommend that we see about a snap VAC for the patient. I think this is good to be one of the possibly best way is to get this to feeling much more effectively and quickly. That was discussed with the patient and his wife today. They are in agreement with giving this a trial. 3. Have also contemplated looking into the possibility of potentially PuraPly  or NuShield. With that being said I think that right now I would like to look into the snap VAC first and see what we can figure out in that regard. 4. We are going to look into obtaining insurance approval for snap VAC therapy. Hopefully will have answered to that by next week. We will see patient back for reevaluation in 1 week here in the clinic. If anything worsens or changes patient will contact our office for additional recommendations. Electronic Signature(s) Signed: 07/02/2021 10:54:02 AM By: Worthy Keeler PA-C Previous Signature: 07/02/2021 10:53:08 AM Version By: Worthy Keeler PA-C Entered By: Worthy Keeler on 07/02/2021 10:54:02 [name] ([id number]) -------------------------------------------------------------------------------- SuperBill Details Patient Name: [name] Date of Service: 07/02/2021 Medical Record Number: [id number] Patient Account Number: [id number] Date of Birth/Sex: 10/29/1961 (60 y.o. M) Treating RN: Donnamarie Poag Primary Care Provider: Halina Maidens Other Clinician: Referring Provider: Halina Maidens Treating Provider/Extender: Jeri Cos Weeks in Treatment: 2 Diagnosis Coding ICD-10 Codes Code Description [phone] Laceration without foreign body, right ankle, initial encounter L97.312 Non-pressure chronic ulcer of right ankle with fat layer exposed [place] (primary) hypertension Facility Procedures CPT4 Code: [id number] Description: 42595 - DEB SUBQ TISSUE 20 SQ  CM/< Modifier: Quantity: 1 CPT4 Code: Description: ICD-10 Diagnosis Description G38.756 Non-pressure chronic ulcer of right ankle with fat layer exposed Modifier: Quantity: Physician Procedures CPT4 Code: [id number] Description: 88416 - WC PHYS LEVEL 4 - EST PT Modifier: 25 Quantity: 1 CPT4 Code: Description: ICD-10 Diagnosis Description S91.011A Laceration without foreign body, right ankle, initial encounter L97.312 Non-pressure chronic ulcer of right ankle with fat layer exposed I10 Essential (primary) hypertension Modifier: Quantity: CPT4 Code: [id number] Description: 11042 - WC PHYS SUBQ TISS 20 SQ CM Modifier: Quantity: 1 CPT4 Code: Description: ICD-10 Diagnosis Description W10.932 Non-pressure chronic ulcer of right ankle with fat layer exposed Modifier: Quantity: Electronic Signature(s) Signed: 07/02/2021 10:53:29 AM By: Worthy Keeler PA-C Entered By: Worthy Keeler on 07/02/2021 10:53:28

## 2021-07-02 NOTE — Progress Notes (Signed)
HENDERSON, FRAMPTON ([id number]) Visit Report for 07/02/2021 Arrival Information Details Patient Name: [name], [name] Date of Service: 07/02/2021 10:30 AM Medical Record Number: [id number] Patient Account Number: [id number] Date of Birth/Sex: [birth year] (59 y.o. M) Treating RN: Donnamarie Poag Primary Care Yamilex Borgwardt: Halina Maidens Other Clinician: Referring Nikkie Liming: Halina Maidens Treating Cortne Amara/Extender: Skipper Cliche in Treatment: 2 Visit Information History Since Last Visit Added or deleted any medications: No Patient Arrived: Ambulatory Had a fall or experienced change in No Arrival Time: 10:10 activities of daily living that may affect Accompanied By: wife risk of falls: Transfer Assistance: None Hospitalized since last visit: No Patient Identification Verified: Yes Has Dressing in Place as Prescribed: Yes Secondary Verification Process Completed: Yes Pain Present Now: Yes Patient Has Alerts: Yes Patient Alerts: NOT diabetic Electronic Signature(s) Signed: 07/02/2021 3:14:45 PM By: Donnamarie Poag Entered By: Donnamarie Poag on 07/02/2021 10:12:24 [name] ([id number]) -------------------------------------------------------------------------------- Clinic Level of Care Assessment Details Patient Name: [name] Date of Service: 07/02/2021 10:30 AM Medical Record Number: [id number] Patient Account Number: [id number] Date of Birth/Sex: 02-25-1961 (59 y.o. M) Treating RN: Donnamarie Poag Primary Care Gilad Dugger: Halina Maidens Other Clinician: Referring Kaia Depaolis: Halina Maidens Treating Genene Kilman/Extender: Skipper Cliche in Treatment: 2 Clinic Level of Care Assessment Items TOOL 1 Quantity Score []  - Use when EandM and Procedure is performed on INITIAL visit 0 ASSESSMENTS - Nursing Assessment / Reassessment []  - General Physical Exam (combine w/ comprehensive assessment (listed just below) when performed on new 0 pt. evals) []  - 0 Comprehensive Assessment (HX,  ROS, Risk Assessments, Wounds Hx, etc.) ASSESSMENTS - Wound and Skin Assessment / Reassessment []  - Dermatologic / Skin Assessment (not related to wound area) 0 ASSESSMENTS - Ostomy and/or Continence Assessment and Care []  - Incontinence Assessment and Management 0 []  - 0 Ostomy Care Assessment and Management (repouching, etc.) PROCESS - Coordination of Care []  - Simple Patient / Family Education for ongoing care 0 []  - 0 Complex (extensive) Patient / Family Education for ongoing care []  - 0 Staff obtains Programmer, systems, Records, Test Results / Process Orders []  - 0 Staff telephones HHA, Nursing Homes / Clarify orders / etc []  - 0 Routine Transfer to another Facility (non-emergent condition) []  - 0 Routine Hospital Admission (non-emergent condition) []  - 0 New Admissions / Biomedical engineer / Ordering NPWT, Apligraf, etc. []  - 0 Emergency Hospital Admission (emergent condition) PROCESS - Special Needs []  - Pediatric / Minor Patient Management 0 []  - 0 Isolation Patient Management []  - 0 Hearing / Language / Visual special needs []  - 0 Assessment of Community assistance (transportation, D/C planning, etc.) []  - 0 Additional assistance / Altered mentation []  - 0 Support Surface(s) Assessment (bed, cushion, seat, etc.) INTERVENTIONS - Miscellaneous []  - External ear exam 0 []  - 0 Patient Transfer (multiple staff / Civil Service fast streamer / Similar devices) []  - 0 Simple Staple / Suture removal (25 or less) []  - 0 Complex Staple / Suture removal (26 or more) []  - 0 Hypo/Hyperglycemic Management (do not check if billed separately) []  - 0 Ankle / Brachial Index (ABI) - do not check if billed separately Has the patient been seen at the hospital within the last three years: Yes Total Score: 0 Level Of Care: ____ [name] ([id number]) Electronic Signature(s) Signed: 07/02/2021 3:14:45 PM By: Donnamarie Poag Entered By: Donnamarie Poag on 07/02/2021 10:43:17 [name]  ([id number]) -------------------------------------------------------------------------------- Encounter Discharge Information Details Patient Name: [name] Date of Service: 07/02/2021 10:30 AM Medical Record  Number: [id number] Patient Account Number: [id number] Date of Birth/Sex: [birth year] (59 y.o. M) Treating RN: Donnamarie Poag Primary Care Alaisha Eversley: Halina Maidens Other Clinician: Referring Alysiana Ethridge: Halina Maidens Treating Chick Cousins/Extender: Skipper Cliche in Treatment: 2 Encounter Discharge Information Items Post Procedure Vitals Discharge Condition: Stable Temperature (F): 97.9 Ambulatory Status: Ambulatory Pulse (bpm): 64 Discharge Destination: Home Respiratory Rate (breaths/min): 16 Transportation: Private Auto Blood Pressure (mmHg): 128/82 Accompanied By: wife Schedule Follow-up Appointment: Yes Clinical Summary of Care: Electronic Signature(s) Signed: 07/02/2021 3:14:45 PM By: Donnamarie Poag Entered By: Donnamarie Poag on 07/02/2021 10:50:03 [name] ([id number]) -------------------------------------------------------------------------------- Lower Extremity Assessment Details Patient Name: [name] Date of Service: 07/02/2021 10:30 AM Medical Record Number: [id number] Patient Account Number: [id number] Date of Birth/Sex: March 16, 1961 (59 y.o. M) Treating RN: Donnamarie Poag Primary Care Loranda Mastel: Halina Maidens Other Clinician: Referring Shahid Flori: Halina Maidens Treating Ziana Heyliger/Extender: Jeri Cos Weeks in Treatment: 2 Edema Assessment Assessed: [Left: No] [Right: Yes] Edema: [Left: N] [Right: o] Vascular Assessment Pulses: Dorsalis Pedis Palpable: [Right:Yes] Electronic Signature(s) Signed: 07/02/2021 3:14:45 PM By: Donnamarie Poag Entered By: Donnamarie Poag on 07/02/2021 10:18:53 [name] ([id number]) -------------------------------------------------------------------------------- Multi Wound Chart Details Patient Name: [name] Date of Service: 07/02/2021 10:30 AM Medical Record Number: [id number] Patient Account Number: [id number] Date of Birth/Sex: December 14, 1961 (59 y.o. M) Treating RN: Donnamarie Poag Primary Care Kayliee Atienza: Halina Maidens Other Clinician: Referring Dreyah Montrose: Halina Maidens Treating Alyona Romack/Extender: Jeri Cos Weeks in Treatment: 2 Vital Signs Height(in): 66 Pulse(bpm): 64 Weight(lbs): 161 Blood Pressure(mmHg): 128/82 Body Mass Index(BMI): 26 Temperature(F): 97.9 Respiratory Rate(breaths/min): 16 Photos: [N/A:N/A] Wound Location: Right, Lateral Ankle N/A N/A Wounding Event: Trauma N/A N/A Primary Etiology: Trauma, Other N/A N/A Comorbid History: Hypertension N/A N/A Date Acquired: 05/09/2021 N/A N/A Weeks of Treatment: 2 N/A N/A Wound Status: Open N/A N/A Measurements L x W x D (cm) 1x1x0.5 N/A N/A Area (cm) : 0.785 N/A N/A Volume (cm) : 0.393 N/A N/A % Reduction in Area: 0.00% N/A N/A % Reduction in Volume: 0.00% N/A N/A Starting Position 1 (o'clock): 9 Ending Position 1 (o'clock): 9 Maximum Distance 1 (cm): 0.3 Starting Position 2 (o'clock): 3 Ending Position 2 (o'clock): 3 Maximum Distance 2 (cm): 0.3 Undermining: Yes N/A N/A Classification: Full Thickness Without Exposed N/A N/A Support Structures Exudate Amount: Medium N/A N/A Exudate Type: Serosanguineous N/A N/A Exudate Color: red, brown N/A N/A Granulation Amount: Small (1-33%) N/A N/A Granulation Quality: Red, Pink N/A N/A Necrotic Amount: Large (67-100%) N/A N/A Exposed Structures: Fat Layer (Subcutaneous Tissue): N/A N/A Yes Fascia: No Tendon: No Muscle: No Joint: No Bone: No Epithelialization: None N/A N/A Treatment Notes Electronic Signature(s) Signed: 07/02/2021 3:14:45 PM By: Lazarus Gowda ([id number]) Entered By: Donnamarie Poag on 07/02/2021 10:19:38 [name] ([id number]) -------------------------------------------------------------------------------- Multi-Disciplinary Care  Plan Details Patient Name: [name] Date of Service: 07/02/2021 10:30 AM Medical Record Number: [id number] Patient Account Number: [id number] Date of Birth/Sex: 02-09-1961 (59 y.o. M) Treating RN: Donnamarie Poag Primary Care Janira Mandell: Halina Maidens Other Clinician: Referring Laurie Lovejoy: Halina Maidens Treating Cherica Heiden/Extender: Jeri Cos Weeks in Treatment: 2 Active Inactive Wound/Skin Impairment Nursing Diagnoses: Knowledge deficit related to ulceration/compromised skin integrity Goals: Patient/caregiver will verbalize understanding of skin care regimen Date Initiated: 06/18/2021 Target Resolution Date: 07/18/2021 Goal Status: Active Ulcer/skin breakdown will have a volume reduction of 30% by week 4 Date Initiated: 06/18/2021 Target Resolution Date: 07/18/2021 Goal Status: Active Ulcer/skin breakdown will have a volume reduction of 50% by week 8 Date Initiated: 06/18/2021 Target Resolution Date: 08/18/2021 Goal  Status: Active Ulcer/skin breakdown will have a volume reduction of 80% by week 12 Date Initiated: 06/18/2021 Target Resolution Date: 09/18/2021 Goal Status: Active Ulcer/skin breakdown will heal within 14 weeks Date Initiated: 06/18/2021 Target Resolution Date: 10/18/2021 Goal Status: Active Interventions: Assess patient/caregiver ability to obtain necessary supplies Assess patient/caregiver ability to perform ulcer/skin care regimen upon admission and as needed Assess ulceration(s) every visit Notes: Electronic Signature(s) Signed: 07/02/2021 3:14:45 PM By: Donnamarie Poag Entered By: Donnamarie Poag on 07/02/2021 10:19:17 [name] ([id number]) -------------------------------------------------------------------------------- Pain Assessment Details Patient Name: [name] Date of Service: 07/02/2021 10:30 AM Medical Record Number: [id number] Patient Account Number: [id number] Date of Birth/Sex: [birth year] (59 y.o. M) Treating RN: Donnamarie Poag Primary Care  Kaelei Wheeler: Halina Maidens Other Clinician: Referring Alvah Lagrow: Halina Maidens Treating Deina Lipsey/Extender: Skipper Cliche in Treatment: 2 Active Problems Location of Pain Severity and Description of Pain Patient Has Paino Yes Site Locations Pain Location: Pain in Ulcers Rate the pain. Current Pain Level: 5 Pain Management and Medication Current Pain Management: Electronic Signature(s) Signed: 07/02/2021 3:14:45 PM By: Donnamarie Poag Entered By: Donnamarie Poag on 07/02/2021 10:12:49 [name] ([id number]) -------------------------------------------------------------------------------- Patient/Caregiver Education Details Patient Name: [name] Date of Service: 07/02/2021 10:30 AM Medical Record Number: [id number] Patient Account Number: [id number] Date of Birth/Gender: 1961-07-02 (60 y.o. M) Treating RN: Donnamarie Poag Primary Care Physician: Halina Maidens Other Clinician: Referring Physician: Halina Maidens Treating Physician/Extender: Skipper Cliche in Treatment: 2 Education Assessment Education Provided To: Patient Education Topics Provided Basic Hygiene: Wound/Skin Impairment: Electronic Signature(s) Signed: 07/02/2021 3:14:45 PM By: Donnamarie Poag Entered By: Donnamarie Poag on 07/02/2021 10:28:48 [name] ([id number]) -------------------------------------------------------------------------------- Wound Assessment Details Patient Name: [name] Date of Service: 07/02/2021 10:30 AM Medical Record Number: [id number] Patient Account Number: [id number] Date of Birth/Sex: 08/24/1961 (59 y.o. M) Treating RN: Donnamarie Poag Primary Care Mykah Shin: Halina Maidens Other Clinician: Referring Ladonne Sharples: Halina Maidens Treating [place] Silveria/Extender: Jeri Cos Weeks in Treatment: 2 Wound Status Wound Number: 1 Primary Etiology: Trauma, Other Wound Location: Right, Lateral Ankle Wound Status: Open Wounding Event: Trauma Comorbid History: Hypertension Date  Acquired: 05/09/2021 Weeks Of Treatment: 2 Clustered Wound: No Photos Wound Measurements Length: (cm) 1 Width: (cm) 1 Depth: (cm) 0.5 Area: (cm) 0.785 Volume: (cm) 0.393 % Reduction in Area: 0% % Reduction in Volume: 0% Epithelialization: None Tunneling: No Undermining: Yes Location 1 Starting Position (o'clock): 9 Ending Position (o'clock): 9 Maximum Distance: (cm) 0.3 Location 2 Starting Position (o'clock): 3 Ending Position (o'clock): 3 Maximum Distance: (cm) 0.3 Wound Description Classification: Full Thickness Without Exposed Support Structu Exudate Amount: Medium Exudate Type: Serosanguineous Exudate Color: red, brown res Foul Odor After Cleansing: No Slough/Fibrino Yes Wound Bed Granulation Amount: Small (1-33%) Exposed Structure Granulation Quality: Red, Pink Fascia Exposed: No Necrotic Amount: Large (67-100%) Fat Layer (Subcutaneous Tissue) Exposed: Yes Necrotic Quality: Adherent Slough Tendon Exposed: No Muscle Exposed: No Joint Exposed: No Bone Exposed: No Treatment Notes [name], [name] ([id number]) Wound #1 (Ankle) Wound Laterality: Right, Lateral Cleanser Soap and Water Discharge Instruction: Gently cleanse wound with antibacterial soap, rinse and pat dry prior to dressing wounds Peri-Wound Care Topical Primary Dressing Prisma 4.34 (in) Discharge Instruction: Moisten w/normal saline or sterile water; Cover wound as directed. Do not remove from wound bed. Secondary Dressing Bordered Gauze Sterile-HBD 4x4 (in/in) Discharge Instruction: Cover wound on top to secure with Bordered Guaze Sterile as directed Foam Dressing, 4x4 (in/in) Discharge Instruction: Bolster the collagen down with foam on top of it Secured With Compression Wrap Compression  Stockings Environmental education officer) Signed: 07/02/2021 3:14:45 PM By: Donnamarie Poag Entered By: Donnamarie Poag on 07/02/2021 10:17:35 [name]  ([id number]) -------------------------------------------------------------------------------- [place] Details Patient Name: [name] Date of Service: 07/02/2021 10:30 AM Medical Record Number: [id number] Patient Account Number: [id number] Date of Birth/Sex: 1961/01/30 (59 y.o. M) Treating RN: Donnamarie Poag Primary Care Henri Baumler: Halina Maidens Other Clinician: Referring Debany Vantol: Halina Maidens Treating Montia Haslip/Extender: Jeri Cos Weeks in Treatment: 2 Vital Signs Time Taken: 10:12 Temperature (F): 97.9 Height (in): 66 Pulse (bpm): 64 Weight (lbs): 161 Respiratory Rate (breaths/min): 16 Body Mass Index (BMI): 26 Blood Pressure (mmHg): 128/82 Reference Range: 80 - 120 mg / dl Electronic Signature(s) Signed: 07/02/2021 3:14:45 PM By: Donnamarie Poag Entered ByDonnamarie Poag on 07/02/2021 10:12:41

## 2021-07-09 ENCOUNTER — Other Ambulatory Visit: Payer: Self-pay

## 2021-07-09 ENCOUNTER — Encounter: Payer: Managed Care, Other (non HMO) | Admitting: Physician Assistant

## 2021-07-09 DIAGNOSIS — L97312 Non-pressure chronic ulcer of right ankle with fat layer exposed: Secondary | ICD-10-CM | POA: Diagnosis not present

## 2021-07-09 NOTE — Progress Notes (Addendum)
SAMMUEL, BLICK ([id number]) Visit Report for 07/09/2021 Chief Complaint Document Details Patient Name: [name], [name] Date of Service: 07/09/2021 1:30 PM Medical Record Number: [id number] Patient Account Number: [id number] Date of Birth/Sex: 10/04/1961 (60 y.o. M) Treating RN: Carlene Coria Primary Care Provider: Halina Maidens Other Clinician: Referring Provider: Halina Maidens Treating Provider/Extender: Skipper Cliche in Treatment: 3 Information Obtained from: Patient Chief Complaint Right ankle laceration Electronic Signature(s) Signed: 07/09/2021 1:34:59 PM By: Worthy Keeler PA-C Entered By: Worthy Keeler on 07/09/2021 13:34:59 [name] ([id number]) -------------------------------------------------------------------------------- Debridement Details Patient Name: [name] Date of Service: 07/09/2021 1:30 PM Medical Record Number: [id number] Patient Account Number: [id number] Date of Birth/Sex: 1961/11/12 (60 y.o. M) Treating RN: Carlene Coria Primary Care Provider: Halina Maidens Other Clinician: Referring Provider: Halina Maidens Treating Provider/Extender: Jeri Cos Weeks in Treatment: 3 Debridement Performed for Wound #1 Right,Lateral Ankle Assessment: Performed By: Physician Tommie Sams., PA-C Debridement Type: Debridement Level of Consciousness (Pre- Awake and Alert procedure): Pre-procedure Verification/Time Out Yes - 14:10 Taken: Start Time: 14:10 Pain Control: Lidocaine 4% Topical Solution Total Area Debrided (L x W): 1 (cm) x 1.3 (cm) = 1.3 (cm) Tissue and other material Viable, Non-Viable, Slough, Subcutaneous, Skin: Dermis , Skin: Epidermis, Biofilm, Slough debrided: Level: Skin/Subcutaneous Tissue Debridement Description: Excisional Instrument: Curette Bleeding: Moderate Hemostasis Achieved: Pressure End Time: 14:12 Procedural Pain: 0 Post Procedural Pain: 0 Response to Treatment: Procedure was tolerated  well Level of Consciousness (Post- Awake and Alert procedure): Post Debridement Measurements of Total Wound Length: (cm) 1 Width: (cm) 1.3 Depth: (cm) 0.5 Volume: (cm) 0.511 Character of Wound/Ulcer Post Debridement: Improved Post Procedure Diagnosis Same as Pre-procedure Electronic Signature(s) Signed: 07/09/2021 4:42:29 PM By: Worthy Keeler PA-C Signed: 07/12/2021 2:11:15 PM By: Carlene Coria RN Entered By: Carlene Coria on 07/09/2021 14:11:36 [name] ([id number]) -------------------------------------------------------------------------------- HPI Details Patient Name: [name] Date of Service: 07/09/2021 1:30 PM Medical Record Number: [id number] Patient Account Number: [id number] Date of Birth/Sex: 08/09/1961 (60 y.o. M) Treating RN: Carlene Coria Primary Care Provider: Halina Maidens Other Clinician: Referring Provider: Halina Maidens Treating Provider/Extender: Skipper Cliche in Treatment: 3 History of Present Illness HPI Description: 06/14/2021 upon evaluation today patient appears to be doing somewhat poorly in regard to the wound on his right ankle region. This was an injury that apparently occurred at work according to what he is telling me today. He is seen with his wife in the office at this point. Nonetheless the patient appears to have a circular opening with necrotic tissue in the base of the wound. This does not appear to be doing nearly as good as I would hope. Fortunately there does not appear to be any signs of infection though he does have a significant amount of necrotic tissue this is going to take some time to heal. He also has previously had surgery in this area there is a lot of scar tissue that will again complicate things and extended healing process. The patient does have hypertension otherwise no major medical problems. 06/25/2021 upon evaluation today patient presents for reevaluation here in our clinic he actually does seem to be doing  better in regard to his wound on the right lateral ankle. He has been tolerating the dressing changes without complication. Fortunately I do not see any signs of infection which is great news. 07/02/2021 upon evaluation today patient appears to be doing about the same in regard to his wound. With that being said I do believe that this  is cleaner there was a little bit of slough noted we have been using Iodoflex. I was able to see a little bit more granulation tissue starting to try to build up but I think the scar tissue here is going to be the biggest slowing factor with regard to healing. That was discussed with the patient and his wife today. Nonetheless I think we want to switch up things based on what I see at this point. 07/09/2021 upon evaluation today patient appears to be doing about the same in regard to his ankle ulcer. He has been tolerating the dressing changes without complication. Fortunately there does not appear to be any signs of active infection. No fevers, chills, nausea, vomiting, or diarrhea. Electronic Signature(s) Signed: 07/09/2021 2:31:14 PM By: Worthy Keeler PA-C Entered By: Worthy Keeler on 07/09/2021 14:31:14 [name] ([id number]) -------------------------------------------------------------------------------- Physical Exam Details Patient Name: [name] Date of Service: 07/09/2021 1:30 PM Medical Record Number: [id number] Patient Account Number: [id number] Date of Birth/Sex: 30-May-1961 (60 y.o. M) Treating RN: Carlene Coria Primary Care Provider: Halina Maidens Other Clinician: Referring Provider: Halina Maidens Treating Provider/Extender: Jeri Cos Weeks in Treatment: 3 Constitutional Well-nourished and well-hydrated in no acute distress. Respiratory normal breathing without difficulty. Psychiatric this patient is able to make decisions and demonstrates good insight into disease process. Alert and Oriented x 3. pleasant and  cooperative. Notes Upon inspection patient's wound bed showed signs of good granulation epithelization at this point. There does not appear to be any evidence of infection which is great news and overall very pleased with where things stand today. No fevers, chills, nausea, vomiting, or diarrhea. Electronic Signature(s) Signed: 07/09/2021 2:31:31 PM By: Worthy Keeler PA-C Entered By: Worthy Keeler on 07/09/2021 14:31:31 [name] ([id number]) -------------------------------------------------------------------------------- Physician Orders Details Patient Name: [name] Date of Service: 07/09/2021 1:30 PM Medical Record Number: [id number] Patient Account Number: [id number] Date of Birth/Sex: 09/30/1961 (60 y.o. M) Treating RN: Carlene Coria Primary Care Provider: Halina Maidens Other Clinician: Referring Provider: Halina Maidens Treating Provider/Extender: Skipper Cliche in Treatment: 3 Verbal / Phone Orders: No Diagnosis Coding ICD-10 Coding Code Description Q76.226J Laceration without foreign body, right ankle, initial encounter L97.312 Non-pressure chronic ulcer of right ankle with fat layer exposed [place] (primary) hypertension Follow-up Appointments o Return Appointment in 1 week. Bathing/ Shower/ Hygiene o May shower; gently cleanse wound with antibacterial soap, rinse and pat dry prior to dressing wounds Edema Control - Lymphedema / Segmental Compressive Device / Other o Elevate, Exercise Daily and Avoid Standing for Long Periods of Time. o Elevate legs to the level of the heart and pump ankles as often as possible o Elevate leg(s) parallel to the floor when sitting. Wound Treatment Wound #1 - Ankle Wound Laterality: Right, Lateral Cleanser: Soap and Water 3 x Per Week/30 Days Discharge Instructions: Gently cleanse wound with antibacterial soap, rinse and pat dry prior to dressing wounds Primary Dressing: Prisma 4.34 (in) 3 x Per  Week/30 Days Discharge Instructions: Moisten w/normal saline or sterile water; Cover wound as directed. Do not remove from wound bed. Secondary Dressing: Bordered Gauze Sterile-HBD 4x4 (in/in) 3 x Per Week/30 Days Discharge Instructions: Cover wound on top to secure with Bordered Guaze Sterile as directed Secondary Dressing: Foam Dressing, 4x4 (in/in) 3 x Per Week/30 Days Discharge Instructions: Bolster the collagen down with foam on top of it Electronic Signature(s) Signed: 07/09/2021 4:42:29 PM By: Worthy Keeler PA-C Signed: 07/12/2021 2:11:15 PM By: Carlene Coria RN  Entered By: Carlene Coria on 07/09/2021 14:10:10 [name] ([id number]) -------------------------------------------------------------------------------- Problem List Details Patient Name: [name] Date of Service: 07/09/2021 1:30 PM Medical Record Number: [id number] Patient Account Number: [id number] Date of Birth/Sex: Mar 11, 1961 (60 y.o. M) Treating RN: Carlene Coria Primary Care Provider: Halina Maidens Other Clinician: Referring Provider: Halina Maidens Treating Provider/Extender: Skipper Cliche in Treatment: 3 Active Problems ICD-10 Encounter Code Description Active Date MDM Diagnosis S91.011A Laceration without foreign body, right ankle, initial encounter 06/18/2021 No Yes L97.312 Non-pressure chronic ulcer of right ankle with fat layer exposed 06/18/2021 No Yes I10 Essential (primary) hypertension 06/18/2021 No Yes Inactive Problems Resolved Problems Electronic Signature(s) Signed: 07/09/2021 1:34:54 PM By: Worthy Keeler PA-C Entered By: Worthy Keeler on 07/09/2021 13:34:54 [name] ([id number]) -------------------------------------------------------------------------------- Progress Note Details Patient Name: [name] Date of Service: 07/09/2021 1:30 PM Medical Record Number: [id number] Patient Account Number: [id number] Date of Birth/Sex: [birth year] (60 y.o. M) Treating  RN: Carlene Coria Primary Care Provider: Halina Maidens Other Clinician: Referring Provider: Halina Maidens Treating Provider/Extender: Skipper Cliche in Treatment: 3 Subjective Chief Complaint Information obtained from Patient Right ankle laceration History of Present Illness (HPI) 06/14/2021 upon evaluation today patient appears to be doing somewhat poorly in regard to the wound on his right ankle region. This was an injury that apparently occurred at work according to what he is telling me today. He is seen with his wife in the office at this point. Nonetheless the patient appears to have a circular opening with necrotic tissue in the base of the wound. This does not appear to be doing nearly as good as I would hope. Fortunately there does not appear to be any signs of infection though he does have a significant amount of necrotic tissue this is going to take some time to heal. He also has previously had surgery in this area there is a lot of scar tissue that will again complicate things and extended healing process. The patient does have hypertension otherwise no major medical problems. 06/25/2021 upon evaluation today patient presents for reevaluation here in our clinic he actually does seem to be doing better in regard to his wound on the right lateral ankle. He has been tolerating the dressing changes without complication. Fortunately I do not see any signs of infection which is great news. 07/02/2021 upon evaluation today patient appears to be doing about the same in regard to his wound. With that being said I do believe that this is cleaner there was a little bit of slough noted we have been using Iodoflex. I was able to see a little bit more granulation tissue starting to try to build up but I think the scar tissue here is going to be the biggest slowing factor with regard to healing. That was discussed with the patient and his wife today. Nonetheless I think we want to switch up  things based on what I see at this point. 07/09/2021 upon evaluation today patient appears to be doing about the same in regard to his ankle ulcer. He has been tolerating the dressing changes without complication. Fortunately there does not appear to be any signs of active infection. No fevers, chills, nausea, vomiting, or diarrhea. Objective Constitutional Well-nourished and well-hydrated in no acute distress. Vitals Time Taken: 1:30 PM, Height: 66 in, Weight: 161 lbs, BMI: 26, Temperature: 98.4 F, Pulse: 60 bpm, Respiratory Rate: 18 breaths/min, Blood Pressure: 120/66 mmHg. Respiratory normal breathing without difficulty. Psychiatric this patient is able to  make decisions and demonstrates good insight into disease process. Alert and Oriented x 3. pleasant and cooperative. General Notes: Upon inspection patient's wound bed showed signs of good granulation epithelization at this point. There does not appear to be any evidence of infection which is great news and overall very pleased with where things stand today. No fevers, chills, nausea, vomiting, or diarrhea. Integumentary (Hair, Skin) Wound #1 status is Open. Original cause of wound was Trauma. The date acquired was: 05/09/2021. The wound has been in treatment 3 weeks. The wound is located on the Right,Lateral Ankle. The wound measures 1cm length x 1.3cm width x 0.5cm depth; 1.021cm^2 area and 0.511cm^3 volume. There is Fat Layer (Subcutaneous Tissue) exposed. There is no tunneling or undermining noted. There is a medium amount of serosanguineous drainage noted. There is medium (34-66%) red, pink granulation within the wound bed. There is a medium (34-66%) amount of necrotic tissue within the wound bed including Adherent Slough. STEEL, KERNEY ([id number]) Assessment Active Problems ICD-10 Laceration without foreign body, right ankle, initial encounter Non-pressure chronic ulcer of right ankle with fat layer exposed [place]  (primary) hypertension Procedures Wound #1 Pre-procedure diagnosis of Wound #1 is a Trauma, Other located on the Right,Lateral Ankle . There was a Excisional Skin/Subcutaneous Tissue Debridement with a total area of 1.3 sq cm performed by Tommie Sams., PA-C. With the following instrument(s): Curette to remove Viable and Non-Viable tissue/material. Material removed includes Subcutaneous Tissue, Slough, Skin: Dermis, Skin: Epidermis, and Biofilm after achieving pain control using Lidocaine 4% Topical Solution. No specimens were taken. A time out was conducted at 14:10, prior to the start of the procedure. A Moderate amount of bleeding was controlled with Pressure. The procedure was tolerated well with a pain level of 0 throughout and a pain level of 0 following the procedure. Post Debridement Measurements: 1cm length x 1.3cm width x 0.5cm depth; 0.511cm^3 volume. Character of Wound/Ulcer Post Debridement is improved. Post procedure Diagnosis Wound #1: Same as Pre-Procedure Plan Follow-up Appointments: Return Appointment in 1 week. Bathing/ Shower/ Hygiene: May shower; gently cleanse wound with antibacterial soap, rinse and pat dry prior to dressing wounds Edema Control - Lymphedema / Segmental Compressive Device / Other: Elevate, Exercise Daily and Avoid Standing for Long Periods of Time. Elevate legs to the level of the heart and pump ankles as often as possible Elevate leg(s) parallel to the floor when sitting. WOUND #1: - Ankle Wound Laterality: Right, Lateral Cleanser: Soap and Water 3 x Per Week/30 Days Discharge Instructions: Gently cleanse wound with antibacterial soap, rinse and pat dry prior to dressing wounds Primary Dressing: Prisma 4.34 (in) 3 x Per Week/30 Days Discharge Instructions: Moisten w/normal saline or sterile water; Cover wound as directed. Do not remove from wound bed. Secondary Dressing: Bordered Gauze Sterile-HBD 4x4 (in/in) 3 x Per Week/30 Days Discharge  Instructions: Cover wound on top to secure with Bordered Guaze Sterile as directed Secondary Dressing: Foam Dressing, 4x4 (in/in) 3 x Per Week/30 Days Discharge Instructions: Bolster the collagen down with foam on top of it 1. Would recommend that we going continue with the wound care measures as before and the patient is in agreement with plan. This includes the use of the silver collagen which I think is still the best way to go. The patient is in agreement with that plan. If anything changes or worsens in general the patient should contact the office and let me know. Otherwise I think that the biggest thing right now is for [place]  to continue with the collagen dressing. 2. I do think that we can check into possibly doing a skin substitute. I do believe that PuraPly could be a good option for the patient here. I also think that he actually could benefit from other skin substitutes as well regular line and benefits request through his insurance to see whether or not we can get this covered. Again we need to do something to try to stimulate growth this is good to be a very slow process otherwise. That was discussed with the patient again today. His wife is present during the conversation as well. 3. Unfortunately the snap VAC was not approved by his insurance therefore we are not able to utilize that at this point nonetheless I think that that could have been potentially of benefit as well. We need to do something to stimulate some additional granulation growth. We will see patient back for reevaluation in 1 week here in the clinic. If anything worsens or changes patient will contact our office for additional recommendations. Electronic Signature(s) Signed: 07/09/2021 2:34:01 PM By: Worthy Keeler PA-C Entered By: Worthy Keeler on 07/09/2021 14:34:00 [name] ([id number] Rued, Venetia Night  ([id number]) -------------------------------------------------------------------------------- SuperBill Details Patient Name: [name] Date of Service: 07/09/2021 Medical Record Number: [id number] Patient Account Number: [id number] Date of Birth/Sex: [birth year] (60 y.o. M) Treating RN: Carlene Coria Primary Care Provider: Halina Maidens Other Clinician: Referring Provider: Halina Maidens Treating Provider/Extender: Jeri Cos Weeks in Treatment: 3 Diagnosis Coding ICD-10 Codes Code Description [phone] Laceration without foreign body, right ankle, initial encounter Z20.802 Non-pressure chronic ulcer of right ankle with fat layer exposed [place] (primary) hypertension Facility Procedures CPT4 Code: [id number] Description: 49753 - DEB SUBQ TISSUE 20 SQ CM/< Modifier: Quantity: 1 CPT4 Code: Description: ICD-10 Diagnosis Description Y05.110 Non-pressure chronic ulcer of right ankle with fat layer exposed Modifier: Quantity: Physician Procedures CPT4 Code: [id number] Description: 11042 - WC PHYS SUBQ TISS 20 SQ CM Modifier: Quantity: 1 CPT4 Code: Description: ICD-10 Diagnosis Description A70.141 Non-pressure chronic ulcer of right ankle with fat layer exposed Modifier: Quantity: Electronic Signature(s) Signed: 07/09/2021 2:34:26 PM By: Worthy Keeler PA-C Entered By: Worthy Keeler on 07/09/2021 14:34:26

## 2021-07-12 NOTE — Progress Notes (Signed)
MARRIO, SCRIBNER ([id number]) Visit Report for 07/09/2021 Arrival Information Details Patient Name: [name], [name] Date of Service: 07/09/2021 1:30 PM Medical Record Number: [id number] Patient Account Number: [id number] Date of Birth/Sex: 04-May-1961 (60 y.o. M) Treating RN: Carlene Coria Primary Care Collen Hostler: Halina Maidens Other Clinician: Referring Neill Jurewicz: Halina Maidens Treating Vershawn Westrup/Extender: Skipper Cliche in Treatment: 3 Visit Information History Since Last Visit All ordered tests and consults were completed: No Patient Arrived: Ambulatory Added or deleted any medications: No Arrival Time: 13:25 Any new allergies or adverse reactions: No Accompanied By: wife Had a fall or experienced change in No Transfer Assistance: None activities of daily living that may affect Patient Identification Verified: Yes risk of falls: Secondary Verification Process Completed: Yes Signs or symptoms of abuse/neglect since last visito No Patient Requires Transmission-Based Precautions: No Hospitalized since last visit: No Patient Has Alerts: Yes Implantable device outside of the clinic excluding No Patient Alerts: NOT diabetic cellular tissue based products placed in the center since last visit: Has Dressing in Place as Prescribed: Yes Pain Present Now: Yes Electronic Signature(s) Signed: 07/12/2021 2:11:15 PM By: Carlene Coria RN Entered By: Carlene Coria on 07/09/2021 13:29:56 [name] ([id number]) -------------------------------------------------------------------------------- Encounter Discharge Information Details Patient Name: [name] Date of Service: 07/09/2021 1:30 PM Medical Record Number: [id number] Patient Account Number: [id number] Date of Birth/Sex: [birth year] (60 y.o. M) Treating RN: Carlene Coria Primary Care Brodey Bonn: Halina Maidens Other Clinician: Referring Clester Chlebowski: Halina Maidens Treating Tane Biegler/Extender: Skipper Cliche in Treatment:  3 Encounter Discharge Information Items Post Procedure Vitals Discharge Condition: Stable Temperature (F): 98.4 Ambulatory Status: Ambulatory Pulse (bpm): 60 Discharge Destination: Home Respiratory Rate (breaths/min): 18 Transportation: Private Auto Blood Pressure (mmHg): 120/66 Accompanied By: self Schedule Follow-up Appointment: Yes Clinical Summary of Care: Patient Declined Electronic Signature(s) Signed: 07/12/2021 2:11:15 PM By: Carlene Coria RN Entered By: Carlene Coria on 07/09/2021 14:36:25 [name] ([id number]) -------------------------------------------------------------------------------- Lower Extremity Assessment Details Patient Name: [name] Date of Service: 07/09/2021 1:30 PM Medical Record Number: [id number] Patient Account Number: [id number] Date of Birth/Sex: 26-Aug-1961 (60 y.o. M) Treating RN: Carlene Coria Primary Care Zayda Angell: Halina Maidens Other Clinician: Referring Raegen Tarpley: Halina Maidens Treating Shealeigh Dunstan/Extender: Jeri Cos Weeks in Treatment: 3 Edema Assessment Assessed: [Left: No] [Right: No] Edema: [Left: Ye] [Right: s] Calf Left: Right: Point of Measurement: 37 cm From Medial Instep 29 cm Ankle Left: Right: Point of Measurement: 10 cm From Medial Instep 20 cm Vascular Assessment Pulses: Dorsalis Pedis Palpable: [Right:Yes] Electronic Signature(s) Signed: 07/12/2021 2:11:15 PM By: Carlene Coria RN Entered By: Carlene Coria on 07/09/2021 13:37:47 [name] ([id number]) -------------------------------------------------------------------------------- Multi Wound Chart Details Patient Name: [name] Date of Service: 07/09/2021 1:30 PM Medical Record Number: [id number] Patient Account Number: [id number] Date of Birth/Sex: 02/11/1961 (60 y.o. M) Treating RN: Carlene Coria Primary Care Chuck Caban: Halina Maidens Other Clinician: Referring Calob Baskette: Halina Maidens Treating Revan Gendron/Extender: Jeri Cos Weeks  in Treatment: 3 Vital Signs Height(in): 66 Pulse(bpm): 60 Weight(lbs): 161 Blood Pressure(mmHg): 120/66 Body Mass Index(BMI): 26 Temperature(F): 98.4 Respiratory Rate(breaths/min): 18 Photos: [N/A:N/A] Wound Location: Right, Lateral Ankle N/A N/A Wounding Event: Trauma N/A N/A Primary Etiology: Trauma, Other N/A N/A Comorbid History: Hypertension N/A N/A Date Acquired: 05/09/2021 N/A N/A Weeks of Treatment: 3 N/A N/A Wound Status: Open N/A N/A Measurements L x W x D (cm) 1x1.3x0.5 N/A N/A Area (cm) : 1.021 N/A N/A Volume (cm) : 0.511 N/A N/A % Reduction in Area: -30.10% N/A N/A % Reduction in Volume: -30.00% N/A N/A Classification: Full  Thickness Without Exposed N/A N/A Support Structures Exudate Amount: Medium N/A N/A Exudate Type: Serosanguineous N/A N/A Exudate Color: red, brown N/A N/A Granulation Amount: Medium (34-66%) N/A N/A Granulation Quality: Red, Pink N/A N/A Necrotic Amount: Medium (34-66%) N/A N/A Exposed Structures: Fat Layer (Subcutaneous Tissue): N/A N/A Yes Fascia: No Tendon: No Muscle: No Joint: No Bone: No Epithelialization: None N/A N/A Treatment Notes Electronic Signature(s) Signed: 07/12/2021 2:11:15 PM By: Carlene Coria RN Entered By: Carlene Coria on 07/09/2021 14:09:35 [name] ([id number]) -------------------------------------------------------------------------------- Multi-Disciplinary Care Plan Details Patient Name: [name] Date of Service: 07/09/2021 1:30 PM Medical Record Number: [id number] Patient Account Number: [id number] Date of Birth/Sex: May 10, 1961 (60 y.o. M) Treating RN: Carlene Coria Primary Care Simona Rocque: Halina Maidens Other Clinician: Referring Sima Lindenberger: Halina Maidens Treating Kendyl Festa/Extender: Jeri Cos Weeks in Treatment: 3 Active Inactive Wound/Skin Impairment Nursing Diagnoses: Knowledge deficit related to ulceration/compromised skin integrity Goals: Patient/caregiver will verbalize  understanding of skin care regimen Date Initiated: 06/18/2021 Target Resolution Date: 07/18/2021 Goal Status: Active Ulcer/skin breakdown will have a volume reduction of 30% by week 4 Date Initiated: 06/18/2021 Target Resolution Date: 07/18/2021 Goal Status: Active Ulcer/skin breakdown will have a volume reduction of 50% by week 8 Date Initiated: 06/18/2021 Target Resolution Date: 08/18/2021 Goal Status: Active Ulcer/skin breakdown will have a volume reduction of 80% by week 12 Date Initiated: 06/18/2021 Target Resolution Date: 09/18/2021 Goal Status: Active Ulcer/skin breakdown will heal within 14 weeks Date Initiated: 06/18/2021 Target Resolution Date: 10/18/2021 Goal Status: Active Interventions: Assess patient/caregiver ability to obtain necessary supplies Assess patient/caregiver ability to perform ulcer/skin care regimen upon admission and as needed Assess ulceration(s) every visit Notes: Electronic Signature(s) Signed: 07/12/2021 2:11:15 PM By: Carlene Coria RN Entered By: Carlene Coria on 07/09/2021 14:09:25 [name] ([id number]) -------------------------------------------------------------------------------- Pain Assessment Details Patient Name: [name] Date of Service: 07/09/2021 1:30 PM Medical Record Number: [id number] Patient Account Number: [id number] Date of Birth/Sex: 1961/02/03 (60 y.o. M) Treating RN: Carlene Coria Primary Care Oktober Glazer: Halina Maidens Other Clinician: Referring Kyan Giannone: Halina Maidens Treating Yilia Sacca/Extender: Skipper Cliche in Treatment: 3 Active Problems Location of Pain Severity and Description of Pain Patient Has Paino Yes Site Locations With Dressing Change: Yes Duration of the Pain. Constant / Intermittento Intermittent How Long Does it Lasto Hours: Minutes: 15 Rate the pain. Current Pain Level: 5 Worst Pain Level: 7 Least Pain Level: 0 Tolerable Pain Level: 5 Character of Pain Describe the Pain:  Burning Pain Management and Medication Current Pain Management: Medication: Yes Cold Application: No Rest: Yes Massage: No Activity: No T.E.N.S.: No Heat Application: No Leg drop or elevation: No Is the Current Pain Management Adequate: Inadequate How does your wound impact your activities of daily livingo Sleep: Yes Bathing: No Appetite: No Relationship With Others: No Bladder Continence: No Emotions: No Bowel Continence: No Work: No Toileting: No Drive: No Dressing: No Hobbies: No Electronic Signature(s) Signed: 07/12/2021 2:11:15 PM By: Carlene Coria RN Entered By: Carlene Coria on 07/09/2021 13:31:58 [name] ([id number]) -------------------------------------------------------------------------------- Wound Assessment Details Patient Name: [name] Date of Service: 07/09/2021 1:30 PM Medical Record Number: [id number] Patient Account Number: [id number] Date of Birth/Sex: 07/17/1961 (60 y.o. M) Treating RN: Carlene Coria Primary Care Tawnee Clegg: Halina Maidens Other Clinician: Referring Elianna Windom: Halina Maidens Treating [place]/Extender: Jeri Cos Weeks in Treatment: 3 Wound Status Wound Number: 1 Primary Etiology: Trauma, Other Wound Location: Right, Lateral Ankle Wound Status: Open Wounding Event: Trauma Comorbid History: Hypertension Date Acquired: 05/09/2021 Weeks Of Treatment: 3 Clustered Wound: No  Photos Wound Measurements Length: (cm) 1 Width: (cm) 1.3 Depth: (cm) 0.5 Area: (cm) 1.021 Volume: (cm) 0.511 % Reduction in Area: -30.1% % Reduction in Volume: -30% Epithelialization: None Tunneling: No Undermining: No Wound Description Classification: Full Thickness Without Exposed Support Structures Exudate Amount: Medium Exudate Type: Serosanguineous Exudate Color: red, brown Foul Odor After Cleansing: No Slough/Fibrino Yes Wound Bed Granulation Amount: Medium (34-66%) Exposed Structure Granulation Quality: Red, Pink Fascia  Exposed: No Necrotic Amount: Medium (34-66%) Fat Layer (Subcutaneous Tissue) Exposed: Yes Necrotic Quality: Adherent Slough Tendon Exposed: No Muscle Exposed: No Joint Exposed: No Bone Exposed: No Treatment Notes Wound #1 (Ankle) Wound Laterality: Right, Lateral Cleanser Soap and Water Discharge Instruction: Gently cleanse wound with antibacterial soap, rinse and pat dry prior to dressing wounds Peri-Wound Care [name], [name] ([id number]) Topical Primary Dressing Prisma 4.34 (in) Discharge Instruction: Moisten w/normal saline or sterile water; Cover wound as directed. Do not remove from wound bed. Secondary Dressing Bordered Gauze Sterile-HBD 4x4 (in/in) Discharge Instruction: Cover wound on top to secure with Bordered Guaze Sterile as directed Foam Dressing, 4x4 (in/in) Discharge Instruction: Bolster the collagen down with foam on top of it Secured With Compression Wrap Compression Stockings Add-Ons Electronic Signature(s) Signed: 07/12/2021 2:11:15 PM By: Carlene Coria RN Entered By: Carlene Coria on 07/09/2021 13:36:31 [name] ([id number]) -------------------------------------------------------------------------------- [place] Details Patient Name: [name] Date of Service: 07/09/2021 1:30 PM Medical Record Number: [id number] Patient Account Number: [id number] Date of Birth/Sex: Feb 06, 1961 (60 y.o. M) Treating RN: Carlene Coria Primary Care Noel Henandez: Halina Maidens Other Clinician: Referring Jung Ingerson: Halina Maidens Treating Mariame Rybolt/Extender: Skipper Cliche in Treatment: 3 Vital Signs Time Taken: 13:30 Temperature (F): 98.4 Height (in): 66 Pulse (bpm): 60 Weight (lbs): 161 Respiratory Rate (breaths/min): 18 Body Mass Index (BMI): 26 Blood Pressure (mmHg): 120/66 Reference Range: 80 - 120 mg / dl Electronic Signature(s) Signed: 07/12/2021 2:11:15 PM By: Carlene Coria RN Entered By: Carlene Coria on 07/09/2021 13:30:31

## 2021-07-16 ENCOUNTER — Other Ambulatory Visit: Payer: Self-pay

## 2021-07-16 ENCOUNTER — Encounter: Payer: Managed Care, Other (non HMO) | Admitting: Physician Assistant

## 2021-07-16 DIAGNOSIS — L97312 Non-pressure chronic ulcer of right ankle with fat layer exposed: Secondary | ICD-10-CM | POA: Diagnosis not present

## 2021-07-16 NOTE — Progress Notes (Addendum)
Ryan, [name] ([id number]) Visit Report for 07/16/2021 Chief Complaint Document Details Patient Name: [name], [name]. Date of Service: 07/16/2021 1:30 PM Medical Record Number: [id number] Patient Account Number: [id number] Date of Birth/Sex: 1961/07/26 (60 y.o. M) Treating RN: Primary Care Provider: Halina Maidens Other Clinician: Referring Provider: Halina Maidens Treating Provider/Extender: Skipper Cliche in Treatment: 4 Information Obtained from: Patient Chief Complaint Right ankle laceration Electronic Signature(s) Signed: 07/16/2021 1:35:47 PM By: Worthy Keeler PA-C Entered By: Worthy Keeler on 07/16/2021 13:35:47 [name] ([id number]) -------------------------------------------------------------------------------- Debridement Details Patient Name: [name] Date of Service: 07/16/2021 1:30 PM Medical Record Number: [id number] Patient Account Number: [id number] Date of Birth/Sex: 01/21/1961 (60 y.o. M) Treating RN: Carlene Coria Primary Care Provider: Halina Maidens Other Clinician: Referring Provider: Halina Maidens Treating Provider/Extender: Skipper Cliche in Treatment: 4 Debridement Performed for Wound #1 Right,Lateral Ankle Assessment: Performed By: Physician Tommie Sams., PA-C Debridement Type: Debridement Level of Consciousness (Pre- Awake and Alert procedure): Pre-procedure Verification/Time Out Yes - 14:05 Taken: Start Time: 14:05 Pain Control: Lidocaine 4% Topical Solution Total Area Debrided (L x W): 1.3 (cm) x 1.4 (cm) = 1.82 (cm) Tissue and other material Viable, Non-Viable, Slough, Subcutaneous, Skin: Dermis , Skin: Epidermis, Slough debrided: Level: Skin/Subcutaneous Tissue Debridement Description: Excisional Instrument: Curette Bleeding: Minimum Hemostasis Achieved: Pressure End Time: 14:08 Procedural Pain: 0 Post Procedural Pain: 0 Response to Treatment: Procedure was tolerated well Level of Consciousness  (Post- Awake and Alert procedure): Post Debridement Measurements of Total Wound Length: (cm) 1.3 Width: (cm) 1.4 Depth: (cm) 0.5 Volume: (cm) 0.715 Character of Wound/Ulcer Post Debridement: Improved Post Procedure Diagnosis Same as Pre-procedure Electronic Signature(s) Signed: 07/16/2021 6:45:55 PM By: Worthy Keeler PA-C Signed: 07/23/2021 4:33:49 PM By: Carlene Coria RN Entered By: Carlene Coria on 07/16/2021 14:07:19 [name] ([id number]) -------------------------------------------------------------------------------- HPI Details Patient Name: [name] Date of Service: 07/16/2021 1:30 PM Medical Record Number: [id number] Patient Account Number: [id number] Date of Birth/Sex: 10/02/1961 (60 y.o. M) Treating RN: Primary Care Provider: Halina Maidens Other Clinician: Referring Provider: Halina Maidens Treating Provider/Extender: Skipper Cliche in Treatment: 4 History of Present Illness HPI Description: 06/14/2021 upon evaluation today patient appears to be doing somewhat poorly in regard to the wound on his right ankle region. This was an injury that apparently occurred at work according to what he is telling me today. He is seen with his wife in the office at this point. Nonetheless the patient appears to have a circular opening with necrotic tissue in the base of the wound. This does not appear to be doing nearly as good as I would hope. Fortunately there does not appear to be any signs of infection though he does have a significant amount of necrotic tissue this is going to take some time to heal. He also has previously had surgery in this area there is a lot of scar tissue that will again complicate things and extended healing process. The patient does have hypertension otherwise no major medical problems. 06/25/2021 upon evaluation today patient presents for reevaluation here in our clinic he actually does seem to be doing better in regard to his wound on the  right lateral ankle. He has been tolerating the dressing changes without complication. Fortunately I do not see any signs of infection which is great news. 07/02/2021 upon evaluation today patient appears to be doing about the same in regard to his wound. With that being said I do believe that this is cleaner there was a  little bit of slough noted we have been using Iodoflex. I was able to see a little bit more granulation tissue starting to try to build up but I think the scar tissue here is going to be the biggest slowing factor with regard to healing. That was discussed with the patient and his wife today. Nonetheless I think we want to switch up things based on what I see at this point. 07/09/2021 upon evaluation today patient appears to be doing about the same in regard to his ankle ulcer. He has been tolerating the dressing changes without complication. Fortunately there does not appear to be any signs of active infection. No fevers, chills, nausea, vomiting, or diarrhea. 07/16/2021 upon evaluation today patient appears to be doing better to some degree in regard to his wound on ankle region. He actually showed some signs of new granulation growth which is actually a good thing. Fortunately there does not appear to be any evidence of active infection and wound did not appear to be too dry upon evaluation today which is also good news. No fevers, chills, nausea, vomiting, or diarrhea. Electronic Signature(s) Signed: 07/16/2021 2:20:29 PM By: Worthy Keeler PA-C Entered By: Worthy Keeler on 07/16/2021 14:20:29 [name] ([id number]) -------------------------------------------------------------------------------- Physical Exam Details Patient Name: [name] Date of Service: 07/16/2021 1:30 PM Medical Record Number: [id number] Patient Account Number: [id number] Date of Birth/Sex: [birth year] (60 y.o. M) Treating RN: Carlene Coria Primary Care Provider: Halina Maidens Other  Clinician: Referring Provider: Halina Maidens Treating Provider/Extender: Jeri Cos Weeks in Treatment: 4 Constitutional Well-nourished and well-hydrated in no acute distress. Respiratory normal breathing without difficulty. Psychiatric this patient is able to make decisions and demonstrates good insight into disease process. Alert and Oriented x 3. pleasant and cooperative. Notes Upon inspection patient's wound bed showed signs of good granulation epithelization at this point. I do not see any signs of infection which is great news and overall I am extremely pleased with where we stand today. Unfortunately the Apligraf and other organogenesis skin substitutes were denied by insurance. Subsequently I Minna recommend that we check into [facility]kin and see if we can get any improvement in this regard. Electronic Signature(s) Signed: 07/16/2021 2:21:00 PM By: Worthy Keeler PA-C Entered By: Worthy Keeler on 07/16/2021 14:21:00 [name] ([id number]) -------------------------------------------------------------------------------- Physician Orders Details Patient Name: [name] Date of Service: 07/16/2021 1:30 PM Medical Record Number: [id number] Patient Account Number: [id number] Date of Birth/Sex: 28-Mar-1961 (60 y.o. M) Treating RN: Carlene Coria Primary Care Provider: Halina Maidens Other Clinician: Referring Provider: Halina Maidens Treating Provider/Extender: Skipper Cliche in Treatment: 4 Verbal / Phone Orders: No Diagnosis Coding ICD-10 Coding Code Description [id number] Laceration without foreign body, right ankle, initial encounter L97.312 Non-pressure chronic ulcer of right ankle with fat layer exposed [place] (primary) hypertension Follow-up Appointments o Return Appointment in 1 week. Bathing/ Shower/ Hygiene o May shower; gently cleanse wound with antibacterial soap, rinse and pat dry prior to dressing wounds Edema Control - Lymphedema /  Segmental Compressive Device / Other o Elevate, Exercise Daily and Avoid Standing for Long Periods of Time. o Elevate legs to the level of the heart and pump ankles as often as possible o Elevate leg(s) parallel to the floor when sitting. Wound Treatment Wound #1 - Ankle Wound Laterality: Right, Lateral Cleanser: Soap and Water 3 x Per Week/30 Days Discharge Instructions: Gently cleanse wound with antibacterial soap, rinse and pat dry prior to dressing wounds Primary Dressing: Prisma 4.34 (  in) 3 x Per Week/30 Days Discharge Instructions: Moisten w/normal saline or sterile water; Cover wound as directed. Do not remove from wound bed. Secondary Dressing: Bordered Gauze Sterile-HBD 4x4 (in/in) 3 x Per Week/30 Days Discharge Instructions: Cover wound on top to secure with Bordered Guaze Sterile as directed Secondary Dressing: Foam Dressing, 4x4 (in/in) 3 x Per Week/30 Days Discharge Instructions: Bolster the collagen down with foam on top of it Electronic Signature(s) Signed: 07/16/2021 6:45:55 PM By: Worthy Keeler PA-C Signed: 07/23/2021 4:33:49 PM By: Carlene Coria RN Entered By: Carlene Coria on 07/16/2021 14:00:55 [name] ([id number]) -------------------------------------------------------------------------------- Problem List Details Patient Name: [name] Date of Service: 07/16/2021 1:30 PM Medical Record Number: [id number] Patient Account Number: [id number] Date of Birth/Sex: 10-09-1961 (60 y.o. M) Treating RN: Primary Care Provider: Halina Maidens Other Clinician: Referring Provider: Halina Maidens Treating Provider/Extender: Skipper Cliche in Treatment: 4 Active Problems ICD-10 Encounter Code Description Active Date MDM Diagnosis S91.011A Laceration without foreign body, right ankle, initial encounter 06/18/2021 No Yes L97.312 Non-pressure chronic ulcer of right ankle with fat layer exposed 06/18/2021 No Yes I10 Essential (primary) hypertension  06/18/2021 No Yes Inactive Problems Resolved Problems Electronic Signature(s) Signed: 07/16/2021 1:35:43 PM By: Worthy Keeler PA-C Entered By: Worthy Keeler on 07/16/2021 13:35:42 [name] ([id number]) -------------------------------------------------------------------------------- Progress Note Details Patient Name: [name] Date of Service: 07/16/2021 1:30 PM Medical Record Number: [id number] Patient Account Number: [id number] Date of Birth/Sex: 1961-12-24 (60 y.o. M) Treating RN: Carlene Coria Primary Care Provider: Halina Maidens Other Clinician: Referring Provider: Halina Maidens Treating Provider/Extender: Skipper Cliche in Treatment: 4 Subjective Chief Complaint Information obtained from Patient Right ankle laceration History of Present Illness (HPI) 06/14/2021 upon evaluation today patient appears to be doing somewhat poorly in regard to the wound on his right ankle region. This was an injury that apparently occurred at work according to what he is telling me today. He is seen with his wife in the office at this point. Nonetheless the patient appears to have a circular opening with necrotic tissue in the base of the wound. This does not appear to be doing nearly as good as I would hope. Fortunately there does not appear to be any signs of infection though he does have a significant amount of necrotic tissue this is going to take some time to heal. He also has previously had surgery in this area there is a lot of scar tissue that will again complicate things and extended healing process. The patient does have hypertension otherwise no major medical problems. 06/25/2021 upon evaluation today patient presents for reevaluation here in our clinic he actually does seem to be doing better in regard to his wound on the right lateral ankle. He has been tolerating the dressing changes without complication. Fortunately I do not see any signs of infection which is great  news. 07/02/2021 upon evaluation today patient appears to be doing about the same in regard to his wound. With that being said I do believe that this is cleaner there was a little bit of slough noted we have been using Iodoflex. I was able to see a little bit more granulation tissue starting to try to build up but I think the scar tissue here is going to be the biggest slowing factor with regard to healing. That was discussed with the patient and his wife today. Nonetheless I think we want to switch up things based on what I see at this point. 07/09/2021 upon evaluation  today patient appears to be doing about the same in regard to his ankle ulcer. He has been tolerating the dressing changes without complication. Fortunately there does not appear to be any signs of active infection. No fevers, chills, nausea, vomiting, or diarrhea. 07/16/2021 upon evaluation today patient appears to be doing better to some degree in regard to his wound on ankle region. He actually showed some signs of new granulation growth which is actually a good thing. Fortunately there does not appear to be any evidence of active infection and wound did not appear to be too dry upon evaluation today which is also good news. No fevers, chills, nausea, vomiting, or diarrhea. Objective Constitutional Well-nourished and well-hydrated in no acute distress. Vitals Time Taken: 1:17 PM, Height: 66 in, Weight: 161 lbs, BMI: 26, Temperature: 98.4 F, Pulse: 71 bpm, Respiratory Rate: 18 breaths/min, Blood Pressure: 134/77 mmHg. Respiratory normal breathing without difficulty. Psychiatric this patient is able to make decisions and demonstrates good insight into disease process. Alert and Oriented x 3. pleasant and cooperative. General Notes: Upon inspection patient's wound bed showed signs of good granulation epithelization at this point. I do not see any signs of infection which is great news and overall I am extremely pleased with where  we stand today. Unfortunately the Apligraf and other organogenesis skin substitutes were denied by insurance. Subsequently I Minna recommend that we check into The[facility]kin and see if we can get any improvement in this regard. Integumentary (Hair, Skin) Wound #1 status is Open. Original cause of wound was Trauma. The date acquired was: 05/09/2021. The wound has been in treatment 4 weeks. The wound is located on the Right,Lateral Ankle. The wound measures 1.3cm length x 1.4cm width x 0.5cm depth; 1.429cm^2 area and 0.715cm^3 volume. There is Fat Layer (Subcutaneous Tissue) exposed. There is no tunneling or undermining noted. There is a medium amount of serosanguineous drainage noted. There is medium (34-66%) red, pink granulation within the wound bed. There is a medium (34-66%) amount [place], Lavone G. ([id number]) of necrotic tissue within the wound bed including Adherent Slough. Assessment Active Problems ICD-10 Laceration without foreign body, right ankle, initial encounter Non-pressure chronic ulcer of right ankle with fat layer exposed Essential (primary) hypertension Procedures Wound #1 Pre-procedure diagnosis of Wound #1 is a Trauma, Other located on the Right,Lateral Ankle . There was a Excisional Skin/Subcutaneous Tissue Debridement with a total area of 1.82 sq cm performed by Tommie Sams., PA-C. With the following instrument(s): Curette to remove Viable and Non-Viable tissue/material. Material removed includes Subcutaneous Tissue, Slough, Skin: Dermis, and Skin: Epidermis after achieving pain control using Lidocaine 4% Topical Solution. No specimens were taken. A time out was conducted at 14:05, prior to the start of the procedure. A Minimum amount of bleeding was controlled with Pressure. The procedure was tolerated well with a pain level of 0 throughout and a pain level of 0 following the procedure. Post Debridement Measurements: 1.3cm length x 1.4cm width x 0.5cm depth; 0.715cm^3  volume. Character of Wound/Ulcer Post Debridement is improved. Post procedure Diagnosis Wound #1: Same as Pre-Procedure Plan Follow-up Appointments: Return Appointment in 1 week. Bathing/ Shower/ Hygiene: May shower; gently cleanse wound with antibacterial soap, rinse and pat dry prior to dressing wounds Edema Control - Lymphedema / Segmental Compressive Device / Other: Elevate, Exercise Daily and Avoid Standing for Long Periods of Time. Elevate legs to the level of the heart and pump ankles as often as possible Elevate leg(s) parallel to the floor when sitting. WOUND #  1: - Ankle Wound Laterality: Right, Lateral Cleanser: Soap and Water 3 x Per Week/30 Days Discharge Instructions: Gently cleanse wound with antibacterial soap, rinse and pat dry prior to dressing wounds Primary Dressing: Prisma 4.34 (in) 3 x Per Week/30 Days Discharge Instructions: Moisten w/normal saline or sterile water; Cover wound as directed. Do not remove from wound bed. Secondary Dressing: Bordered Gauze Sterile-HBD 4x4 (in/in) 3 x Per Week/30 Days Discharge Instructions: Cover wound on top to secure with Bordered Guaze Sterile as directed Secondary Dressing: Foam Dressing, 4x4 (in/in) 3 x Per Week/30 Days Discharge Instructions: Bolster the collagen down with foam on top of it 1. Would recommend currently that we go ahead and check into [facility] to see if we can gain approval for applying this. 2. I am also can recommend that we have the patient go ahead and continue with the dressing changes currently as before it is good to be using the silver collagen followed by piece of foam and behind and then cover with a border foam dressing to be changed 3 times a week. 3. I am also can recommend that he continue to monitor for infection everything seems to be doing quite well at this point. We will see patient back for reevaluation in 1 week here in the clinic. If anything worsens or changes patient will contact our office  for additional recommendations. Electronic Signature(s) Signed: 07/16/2021 2:21:29 PM By: Worthy Keeler PA-C Entered By: Worthy Keeler on 07/16/2021 14:21:29 [name] ([id number]) -------------------------------------------------------------------------------- SuperBill Details Patient Name: [name] Date of Service: 07/16/2021 Medical Record Number: [id number] Patient Account Number: [id number] Date of Birth/Sex: 22-Mar-1961 (59 y.o. M) Treating RN: Carlene Coria Primary Care Provider: Halina Maidens Other Clinician: Referring Provider: Halina Maidens Treating Provider/Extender: Jeri Cos Weeks in Treatment: 4 Diagnosis Coding ICD-10 Codes Code Description [phone] Laceration without foreign body, right ankle, initial encounter [id number] Non-pressure chronic ulcer of right ankle with fat layer exposed [place] (primary) hypertension Facility Procedures CPT4 Code: [id number] Description: [id number] - DEB SUBQ TISSUE 20 SQ CM/< Modifier: Quantity: 1 CPT4 Code: Description: ICD-10 Diagnosis Description [id number] Non-pressure chronic ulcer of right ankle with fat layer exposed Modifier: Quantity: Physician Procedures CPT4 Code: [id number] Description: 11042 - WC PHYS SUBQ TISS 20 SQ CM Modifier: Quantity: 1 CPT4 Code: Description: ICD-10 Diagnosis Description [id number] Non-pressure chronic ulcer of right ankle with fat layer exposed Modifier: Quantity: Electronic Signature(s) Signed: 07/16/2021 2:22:12 PM By: Worthy Keeler PA-C Entered By: Worthy Keeler on 07/16/2021 14:22:12

## 2021-07-23 ENCOUNTER — Encounter: Payer: Managed Care, Other (non HMO) | Admitting: Physician Assistant

## 2021-07-23 ENCOUNTER — Other Ambulatory Visit: Payer: Self-pay

## 2021-07-23 DIAGNOSIS — L97312 Non-pressure chronic ulcer of right ankle with fat layer exposed: Secondary | ICD-10-CM | POA: Diagnosis not present

## 2021-07-23 NOTE — Progress Notes (Addendum)
[name], [name] ([id number]) Visit Report for 07/23/2021 Chief Complaint Document Details Patient Name: [name], [name]. Date of Service: 07/23/2021 1:30 PM Medical Record Number: [id number] Patient Account Number: [id number] Date of Birth/Sex: 1961-06-17 (60 y.o. M) Treating RN: Carlene Coria Primary Care Provider: Halina [name] Other Clinician: Referring Provider: Halina [name] Treating Provider/Extender: Skipper Cliche in Treatment: 5 Information Obtained from: Patient Chief Complaint Right ankle laceration Electronic Signature(s) Signed: 07/23/2021 1:30:50 PM By: Worthy Keeler PA-C Entered By: Worthy Keeler on 07/23/2021 13:30:50 [name] ([id number]) -------------------------------------------------------------------------------- Debridement Details Patient Name: [name] Date of Service: 07/23/2021 1:30 PM Medical Record Number: [id number] Patient Account Number: [id number] Date of Birth/Sex: February 15, 1961 (60 y.o. M) Treating RN: Carlene Coria Primary Care Provider: Halina [name] Other Clinician: Referring Provider: Halina [name] Treating Provider/Extender: Jeri Cos Weeks in Treatment: 5 Debridement Performed for Wound #1 Right,Lateral Ankle Assessment: Performed By: Physician Tommie Sams., PA-C Debridement Type: Debridement Level of Consciousness (Pre- Awake and Alert procedure): Pre-procedure Verification/Time Out Yes - 13:33 Taken: Start Time: 13:33 Pain Control: Lidocaine 4% Topical Solution Total Area Debrided (L x W): 1.4 (cm) x 1.6 (cm) = 2.24 (cm) Tissue and other material Viable, Non-Viable, Slough, Subcutaneous, Skin: Dermis , Skin: Epidermis, Slough debrided: Level: Skin/Subcutaneous Tissue Debridement Description: Excisional Instrument: Curette Bleeding: Moderate Hemostasis Achieved: Pressure End Time: 13:37 Procedural Pain: 0 Post Procedural Pain: 0 Response to Treatment: Procedure was tolerated well Level of  Consciousness (Post- Awake and Alert procedure): Post Debridement Measurements of Total Wound Length: (cm) 1.4 Width: (cm) 1.6 Depth: (cm) 0.5 Volume: (cm) 0.88 Character of Wound/Ulcer Post Debridement: Improved Post Procedure Diagnosis Same as Pre-procedure Electronic Signature(s) Signed: 07/23/2021 4:21:57 PM By: Worthy Keeler PA-C Signed: 07/23/2021 4:32:50 PM By: Carlene Coria RN Entered By: Carlene Coria on 07/23/2021 13:36:26 [name] ([id number]) -------------------------------------------------------------------------------- HPI Details Patient Name: [name] Date of Service: 07/23/2021 1:30 PM Medical Record Number: [id number] Patient Account Number: [id number] Date of Birth/Sex: 04-10-1961 (60 y.o. M) Treating RN: Carlene Coria Primary Care Provider: Halina [name] Other Clinician: Referring Provider: Halina [name] Treating Provider/Extender: Skipper Cliche in Treatment: 5 History of Present Illness HPI Description: 06/14/2021 upon evaluation today patient appears to be doing somewhat poorly in regard to the wound on his right ankle region. This was an injury that apparently occurred at work according to what he is telling me today. He is seen with his wife in the office at this point. Nonetheless the patient appears to have a circular opening with necrotic tissue in the base of the wound. This does not appear to be doing nearly as good as I would hope. Fortunately there does not appear to be any signs of infection though he does have a significant amount of necrotic tissue this is going to take some time to heal. He also has previously had surgery in this area there is a lot of scar tissue that will again complicate things and extended healing process. The patient does have hypertension otherwise no major medical problems. 06/25/2021 upon evaluation today patient presents for reevaluation here in our clinic he actually does seem to be doing better in  regard to his wound on the right lateral ankle. He has been tolerating the dressing changes without complication. Fortunately I do not see any signs of infection which is great news. 07/02/2021 upon evaluation today patient appears to be doing about the same in regard to his wound. With that being said I do believe that this is  cleaner there was a little bit of slough noted we have been using Iodoflex. I was able to see a little bit more granulation tissue starting to try to build up but I think the scar tissue here is going to be the biggest slowing factor with regard to healing. That was discussed with the patient and his wife today. Nonetheless I think we want to switch up things based on what I see at this point. 07/09/2021 upon evaluation today patient appears to be doing about the same in regard to his ankle ulcer. He has been tolerating the dressing changes without complication. Fortunately there does not appear to be any signs of active infection. No fevers, chills, nausea, vomiting, or diarrhea. 07/16/2021 upon evaluation today patient appears to be doing better to some degree in regard to his wound on ankle region. He actually showed some signs of new granulation growth which is actually a good thing. Fortunately there does not appear to be any evidence of active infection and wound did not appear to be too dry upon evaluation today which is also good news. No fevers, chills, nausea, vomiting, or diarrhea. 07/23/2021 upon evaluation today patient appears to be doing a little better in regard to his wound. He has been tolerating the dressing changes without complication. Fortunately there does not appear to be any signs of active infection which is great news. No fever chills noted we are still waiting on the results back from the The[facility]kin investigation to see whether or not he qualifies for this treatment option. Electronic Signature(s) Signed: 07/23/2021 2:10:12 PM By: Worthy Keeler  PA-C Entered By: Worthy Keeler on 07/23/2021 14:10:11 [name] ([id number]) -------------------------------------------------------------------------------- Physical Exam Details Patient Name: [name] Date of Service: 07/23/2021 1:30 PM Medical Record Number: [id number] Patient Account Number: [id number] Date of Birth/Sex: [birth year] (60 y.o. M) Treating RN: Carlene Coria Primary Care Provider: Halina [name] Other Clinician: Referring Provider: Halina [name] Treating Provider/Extender: Jeri Cos Weeks in Treatment: 5 Constitutional Well-nourished and well-hydrated in no acute distress. Respiratory normal breathing without difficulty. Psychiatric this patient is able to make decisions and demonstrates good insight into disease process. Alert and Oriented x 3. pleasant and cooperative. Notes Upon inspection patient's wound bed actually showed signs of good granulation epithelization at this point. There does not appear to be any signs of infection and some sharp debridement was performed to try to clear away some of the necrotic debris in the base of the wound to promote more granulation growth. With that being said I did discuss with the patient as well I think TheraSkin would help speed this up. We also discussed the possibility of continuing with a different dressing as far as a silicone border foam dressing is concerned I think that would be much better it would cause less trauma to the scar tissue around the edges of the wound. Electronic Signature(s) Signed: 07/23/2021 2:10:52 PM By: Worthy Keeler PA-C Entered By: Worthy Keeler on 07/23/2021 14:10:52 [name] ([id number]) -------------------------------------------------------------------------------- Physician Orders Details Patient Name: [name] Date of Service: 07/23/2021 1:30 PM Medical Record Number: [id number] Patient Account Number: [id number] Date of Birth/Sex: 18-Feb-1961 (60  y.o. M) Treating RN: Carlene Coria Primary Care Provider: Halina [name] Other Clinician: Referring Provider: Halina [name] Treating Provider/Extender: Skipper Cliche in Treatment: 5 Verbal / Phone Orders: No Diagnosis Coding ICD-10 Coding Code Description [id number] Laceration without foreign body, right ankle, initial encounter L97.312 Non-pressure chronic ulcer of right ankle with fat layer  exposed [place] (primary) hypertension Follow-up Appointments o Return Appointment in 1 week. Bathing/ Shower/ Hygiene o May shower; gently cleanse wound with antibacterial soap, rinse and pat dry prior to dressing wounds Edema Control - Lymphedema / Segmental Compressive Device / Other o Elevate, Exercise Daily and Avoid Standing for Long Periods of Time. o Elevate legs to the level of the heart and pump ankles as often as possible o Elevate leg(s) parallel to the floor when sitting. Wound Treatment Wound #1 - Ankle Wound Laterality: Right, Lateral Cleanser: Soap and Water 3 x Per Week/30 Days Discharge Instructions: Gently cleanse wound with antibacterial soap, rinse and pat dry prior to dressing wounds Primary Dressing: Prisma 4.34 (in) (Generic) 3 x Per Week/30 Days Discharge Instructions: Moisten w/normal saline or sterile water; Cover wound as directed. Do not remove from wound bed. Secondary Dressing: Zetuvit Plus Silicone Border Dressing 4x4 (in/in) (DME) (Generic) 3 x Per Week/30 Days Electronic Signature(s) Signed: 07/23/2021 4:21:57 PM By: Worthy Keeler PA-C Signed: 07/23/2021 4:32:50 PM By: Carlene Coria RN Entered By: Carlene Coria on 07/23/2021 13:35:03 [name] ([id number]) -------------------------------------------------------------------------------- Problem List Details Patient Name: [name] Date of Service: 07/23/2021 1:30 PM Medical Record Number: [id number] Patient Account Number: [id number] Date of Birth/Sex: Jul 02, 1961 (60 y.o.  M) Treating RN: Carlene Coria Primary Care Provider: Halina [name] Other Clinician: Referring Provider: Halina [name] Treating Provider/Extender: Skipper Cliche in Treatment: 5 Active Problems ICD-10 Encounter Code Description Active Date MDM Diagnosis S91.011A Laceration without foreign body, right ankle, initial encounter 06/18/2021 No Yes L97.312 Non-pressure chronic ulcer of right ankle with fat layer exposed 06/18/2021 No Yes I10 Essential (primary) hypertension 06/18/2021 No Yes Inactive Problems Resolved Problems Electronic Signature(s) Signed: 07/23/2021 1:30:45 PM By: Worthy Keeler PA-C Entered By: Worthy Keeler on 07/23/2021 13:30:44 [name] ([id number]) -------------------------------------------------------------------------------- Progress Note Details Patient Name: [name] Date of Service: 07/23/2021 1:30 PM Medical Record Number: [id number] Patient Account Number: [id number] Date of Birth/Sex: 1961-04-05 (60 y.o. M) Treating RN: Carlene Coria Primary Care Provider: Halina [name] Other Clinician: Referring Provider: Halina [name] Treating Provider/Extender: Skipper Cliche in Treatment: 5 Subjective Chief Complaint Information obtained from Patient Right ankle laceration History of Present Illness (HPI) 06/14/2021 upon evaluation today patient appears to be doing somewhat poorly in regard to the wound on his right ankle region. This was an injury that apparently occurred at work according to what he is telling me today. He is seen with his wife in the office at this point. Nonetheless the patient appears to have a circular opening with necrotic tissue in the base of the wound. This does not appear to be doing nearly as good as I would hope. Fortunately there does not appear to be any signs of infection though he does have a significant amount of necrotic tissue this is going to take some time to heal. He also has previously had surgery  in this area there is a lot of scar tissue that will again complicate things and extended healing process. The patient does have hypertension otherwise no major medical problems. 06/25/2021 upon evaluation today patient presents for reevaluation here in our clinic he actually does seem to be doing better in regard to his wound on the right lateral ankle. He has been tolerating the dressing changes without complication. Fortunately I do not see any signs of infection which is great news. 07/02/2021 upon evaluation today patient appears to be doing about the same in regard to his wound. With  that being said I do believe that this is cleaner there was a little bit of slough noted we have been using Iodoflex. I was able to see a little bit more granulation tissue starting to try to build up but I think the scar tissue here is going to be the biggest slowing factor with regard to healing. That was discussed with the patient and his wife today. Nonetheless I think we want to switch up things based on what I see at this point. 07/09/2021 upon evaluation today patient appears to be doing about the same in regard to his ankle ulcer. He has been tolerating the dressing changes without complication. Fortunately there does not appear to be any signs of active infection. No fevers, chills, nausea, vomiting, or diarrhea. 07/16/2021 upon evaluation today patient appears to be doing better to some degree in regard to his wound on ankle region. He actually showed some signs of new granulation growth which is actually a good thing. Fortunately there does not appear to be any evidence of active infection and wound did not appear to be too dry upon evaluation today which is also good news. No fevers, chills, nausea, vomiting, or diarrhea. 07/23/2021 upon evaluation today patient appears to be doing a little better in regard to his wound. He has been tolerating the dressing changes without complication. Fortunately there does  not appear to be any signs of active infection which is great news. No fever chills noted we are still waiting on the results back from the TheraSkin investigation to see whether or not he qualifies for this treatment option. Objective Constitutional Well-nourished and well-hydrated in no acute distress. Vitals Time Taken: 1:18 PM, Height: 66 in, Weight: 161 lbs, BMI: 26, Temperature: 98.2 F, Pulse: 64 bpm, Respiratory Rate: 18 breaths/min, Blood Pressure: 123/62 mmHg. Respiratory normal breathing without difficulty. Psychiatric this patient is able to make decisions and demonstrates good insight into disease process. Alert and Oriented x 3. pleasant and cooperative. General Notes: Upon inspection patient's wound bed actually showed signs of good granulation epithelization at this point. There does not appear to be any signs of infection and some sharp debridement was performed to try to clear away some of the necrotic debris in the base of the wound to promote more granulation growth. With that being said I did discuss with the patient as well I think TheraSkin would help speed this up. We also discussed the possibility of continuing with a different dressing as far as a silicone border foam dressing is concerned I think that would be much better it would cause less trauma to the scar tissue around the edges of the wound. Integumentary (Hair, Skin) [name], [name]. ([id number]) Wound #1 status is Open. Original cause of wound was Trauma. The date acquired was: 05/09/2021. The wound has been in treatment 5 weeks. The wound is located on the Right,Lateral Ankle. The wound measures 1.4cm length x 1.6cm width x 0.5cm depth; 1.759cm^2 area and 0.88cm^3 volume. There is Fat Layer (Subcutaneous Tissue) exposed. There is no tunneling or undermining noted. There is a medium amount of serosanguineous drainage noted. There is medium (34-66%) red, pink granulation within the wound bed. There is a medium  (34-66%) amount of necrotic tissue within the wound bed including Adherent Slough. Assessment Active Problems ICD-10 Laceration without foreign body, right ankle, initial encounter Non-pressure chronic ulcer of right ankle with fat layer exposed Essential (primary) hypertension Procedures Wound #1 Pre-procedure diagnosis of Wound #1 is a Trauma, Other located on  the Right,Lateral Ankle . There was a Excisional Skin/Subcutaneous Tissue Debridement with a total area of 2.24 sq cm performed by Tommie Sams., PA-C. With the following instrument(s): Curette to remove Viable and Non-Viable tissue/material. Material removed includes Subcutaneous Tissue, Slough, Skin: Dermis, and Skin: Epidermis after achieving pain control using Lidocaine 4% Topical Solution. No specimens were taken. A time out was conducted at 13:33, prior to the start of the procedure. A Moderate amount of bleeding was controlled with Pressure. The procedure was tolerated well with a pain level of 0 throughout and a pain level of 0 following the procedure. Post Debridement Measurements: 1.4cm length x 1.6cm width x 0.5cm depth; 0.88cm^3 volume. Character of Wound/Ulcer Post Debridement is improved. Post procedure Diagnosis Wound #1: Same as Pre-Procedure Plan Follow-up Appointments: Return Appointment in 1 week. Bathing/ Shower/ Hygiene: May shower; gently cleanse wound with antibacterial soap, rinse and pat dry prior to dressing wounds Edema Control - Lymphedema / Segmental Compressive Device / Other: Elevate, Exercise Daily and Avoid Standing for Long Periods of Time. Elevate legs to the level of the heart and pump ankles as often as possible Elevate leg(s) parallel to the floor when sitting. WOUND #1: - Ankle Wound Laterality: Right, Lateral Cleanser: Soap and Water 3 x Per Week/30 Days Discharge Instructions: Gently cleanse wound with antibacterial soap, rinse and pat dry prior to dressing wounds Primary Dressing:  Prisma 4.34 (in) (Generic) 3 x Per Week/30 Days Discharge Instructions: Moisten w/normal saline or sterile water; Cover wound as directed. Do not remove from wound bed. Secondary Dressing: Zetuvit Plus Silicone Border Dressing 4x4 (in/in) (DME) (Generic) 3 x Per Week/30 Days 1. Would recommend currently that we going continue with the wound care measures as before with the collagen to the base of the wound followed by some foam and behind that seems to be doing well. 2. I am also can recommend that we switch to Zetuvit silicone border dressing which I think is good to be the best way to go. Hopefully this will help prevent any irritation around the edges of the wound which were noticing from the Band-Aids he has been using. 3. Were still waiting on the results back from [place] and whether or not the patient qualifies for application. We will see patient back for reevaluation in 1 week here in the clinic. If anything worsens or changes patient will contact our office for additional recommendations. Electronic Signature(s) Signed: 07/23/2021 2:11:33 PM By: Worthy Keeler PA-C Previous Signature: 07/23/2021 2:11:15 PM Version By: Grayland Jack, Venetia Night ([id number]) Entered By: Worthy Keeler on 07/23/2021 14:11:33 [name] ([id number]) -------------------------------------------------------------------------------- SuperBill Details Patient Name: [name] Date of Service: 07/23/2021 Medical Record Number: [id number] Patient Account Number: [id number] Date of Birth/Sex: 09-19-1961 (60 y.o. M) Treating RN: Carlene Coria Primary Care Provider: Halina [name] Other Clinician: Referring Provider: Halina [name] Treating Provider/Extender: Jeri Cos Weeks in Treatment: 5 Diagnosis Coding ICD-10 Codes Code Description [phone] Laceration without foreign body, right ankle, initial encounter L97.312 Non-pressure chronic ulcer of right ankle with fat layer  exposed [place] (primary) hypertension Facility Procedures CPT4 Code: [id number] Description: [id number] - DEB SUBQ TISSUE 20 SQ CM/< Modifier: Quantity: 1 CPT4 Code: Description: ICD-10 Diagnosis Description L97.312 Non-pressure chronic ulcer of right ankle with fat layer exposed Modifier: Quantity: Physician Procedures CPT4 [id number] Description: 99214 - WC PHYS LEVEL 4 - EST PT Modifier: 25 Quantity: 1 CPT4 Code: Description: ICD-10 Diagnosis Description S91.011A Laceration without foreign body, right ankle,  initial encounter [id number] Non-pressure chronic ulcer of right ankle with fat layer exposed I10 Essential (primary) hypertension Modifier: Quantity: CPT4 Code: [id number] Description: [id number] - WC PHYS SUBQ TISS 20 SQ CM Modifier: Quantity: 1 CPT4 Code: Description: ICD-10 Diagnosis Description [id number] Non-pressure chronic ulcer of right ankle with fat layer exposed Modifier: Quantity: Electronic Signature(s) Signed: 07/23/2021 2:11:48 PM By: Worthy Keeler PA-C Entered By: Worthy Keeler on 07/23/2021 14:11:48

## 2021-07-24 NOTE — Progress Notes (Signed)
[name], [name] ([id number]) Visit Report for 07/23/2021 Arrival Information Details Patient Name: [name], [name] Date of Service: 07/23/2021 1:30 PM Medical Record Number: [id number] Patient Account Number: [id number] Date of Birth/Sex: [birth year] (60 y.o. M) Treating RN: Carlene Coria Primary Care Tawn Fitzner: Halina Maidens Other Clinician: Referring Meegan Shanafelt: Halina Maidens Treating Jceon Alverio/Extender: Skipper Cliche in Treatment: 5 Visit Information History Since Last Visit All ordered tests and consults were completed: No Patient Arrived: Ambulatory Added or deleted any medications: No Arrival Time: 13:15 Any new allergies or adverse reactions: No Accompanied By: wife Had a fall or experienced change in No Transfer Assistance: None activities of daily living that may affect Patient Identification Verified: Yes risk of falls: Secondary Verification Process Completed: Yes Signs or symptoms of abuse/neglect since last visito No Patient Requires Transmission-Based Precautions: No Hospitalized since last visit: No Patient Has Alerts: Yes Implantable device outside of the clinic excluding No Patient Alerts: NOT diabetic cellular tissue based products placed in the center since last visit: Has Dressing in Place as Prescribed: Yes Pain Present Now: Yes Electronic Signature(s) Signed: 07/23/2021 4:32:50 PM By: Carlene Coria RN Entered By: Carlene Coria on 07/23/2021 13:18:25 [name] ([id number]) -------------------------------------------------------------------------------- Clinic Level of Care Assessment Details Patient Name: [name] Date of Service: 07/23/2021 1:30 PM Medical Record Number: [id number] Patient Account Number: [id number] Date of Birth/Sex: Jul 23, 1961 (60 y.o. M) Treating RN: Carlene Coria Primary Care Nagee Goates: Halina Maidens Other Clinician: Referring Prophet Renwick: Halina Maidens Treating Kylin Dubs/Extender: Skipper Cliche in Treatment:  5 Clinic Level of Care Assessment Items TOOL 1 Quantity Score '[]'$  - Use when EandM and Procedure is performed on INITIAL visit 0 ASSESSMENTS - Nursing Assessment / Reassessment '[]'$  - General Physical Exam (combine w/ comprehensive assessment (listed just below) when performed on new 0 pt. evals) '[]'$  - 0 Comprehensive Assessment (HX, ROS, Risk Assessments, Wounds Hx, etc.) ASSESSMENTS - Wound and Skin Assessment / Reassessment '[]'$  - Dermatologic / Skin Assessment (not related to wound area) 0 ASSESSMENTS - Ostomy and/or Continence Assessment and Care '[]'$  - Incontinence Assessment and Management 0 '[]'$  - 0 Ostomy Care Assessment and Management (repouching, etc.) PROCESS - Coordination of Care '[]'$  - Simple Patient / Family Education for ongoing care 0 '[]'$  - 0 Complex (extensive) Patient / Family Education for ongoing care '[]'$  - 0 Staff obtains Programmer, systems, Records, Test Results / Process Orders '[]'$  - 0 Staff telephones HHA, Nursing Homes / Clarify orders / etc '[]'$  - 0 Routine Transfer to another Facility (non-emergent condition) '[]'$  - 0 Routine Hospital Admission (non-emergent condition) '[]'$  - 0 New Admissions / Biomedical engineer / Ordering NPWT, Apligraf, etc. '[]'$  - 0 Emergency Hospital Admission (emergent condition) PROCESS - Special Needs '[]'$  - Pediatric / Minor Patient Management 0 '[]'$  - 0 Isolation Patient Management '[]'$  - 0 Hearing / Language / Visual special needs '[]'$  - 0 Assessment of Community assistance (transportation, D/C planning, etc.) '[]'$  - 0 Additional assistance / Altered mentation '[]'$  - 0 Support Surface(s) Assessment (bed, cushion, seat, etc.) INTERVENTIONS - Miscellaneous '[]'$  - External ear exam 0 '[]'$  - 0 Patient Transfer (multiple staff / Civil Service fast streamer / Similar devices) '[]'$  - 0 Simple Staple / Suture removal (25 or less) '[]'$  - 0 Complex Staple / Suture removal (26 or more) '[]'$  - 0 Hypo/Hyperglycemic Management (do not check if billed separately) '[]'$  - 0 Ankle /  Brachial Index (ABI) - do not check if billed separately Has the patient been seen at the hospital within the last three years: Yes  Total Score: 0 Level Of Care: ____ [name] ([id number]) Electronic Signature(s) Signed: 07/23/2021 4:32:50 PM By: Carlene Coria RN Entered By: Carlene Coria on 07/23/2021 13:36:35 [name] ([id number]) -------------------------------------------------------------------------------- Encounter Discharge Information Details Patient Name: [name] Date of Service: 07/23/2021 1:30 PM Medical Record Number: [id number] Patient Account Number: [id number] Date of Birth/Sex: 28-Apr-1961 (60 y.o. M) Treating RN: Carlene Coria Primary Care Morrison Masser: Halina Maidens Other Clinician: Referring Corabelle Spackman: Halina Maidens Treating Starr Urias/Extender: Skipper Cliche in Treatment: 5 Encounter Discharge Information Items Post Procedure Vitals Discharge Condition: Stable Temperature (F): 98.2 Ambulatory Status: Ambulatory Pulse (bpm): 64 Discharge Destination: Home Respiratory Rate (breaths/min): 18 Transportation: Private Auto Blood Pressure (mmHg): 123/62 Accompanied By: wife Schedule Follow-up Appointment: Yes Clinical Summary of Care: Patient Declined Electronic Signature(s) Signed: 07/23/2021 4:32:50 PM By: Carlene Coria RN Entered By: Carlene Coria on 07/23/2021 13:37:57 [name] ([id number]) -------------------------------------------------------------------------------- Lower Extremity Assessment Details Patient Name: [name] Date of Service: 07/23/2021 1:30 PM Medical Record Number: [id number] Patient Account Number: [id number] Date of Birth/Sex: May 27, 1961 (60 y.o. M) Treating RN: Carlene Coria Primary Care Marka Treloar: Halina Maidens Other Clinician: Referring Shanta Hartner: Halina Maidens Treating Keirah Konitzer/Extender: Jeri Cos Weeks in Treatment: 5 Edema Assessment Assessed: [Left: No] [Right: No] Edema: [Left: Ye]  [Right: s] Calf Left: Right: Point of Measurement: 37 cm From Medial Instep 29 cm Ankle Left: Right: Point of Measurement: 10 cm From Medial Instep 21 cm Vascular Assessment Pulses: Dorsalis Pedis Palpable: [Right:Yes] Electronic Signature(s) Signed: 07/23/2021 4:32:50 PM By: Carlene Coria RN Entered By: Carlene Coria on 07/23/2021 13:24:04 [name] ([id number]) -------------------------------------------------------------------------------- Multi Wound Chart Details Patient Name: [name] Date of Service: 07/23/2021 1:30 PM Medical Record Number: [id number] Patient Account Number: [id number] Date of Birth/Sex: 04-29-1961 (60 y.o. M) Treating RN: Carlene Coria Primary Care Lacretia Tindall: Halina Maidens Other Clinician: Referring Deniz Hannan: Halina Maidens Treating Kemisha Bonnette/Extender: Skipper Cliche in Treatment: 5 Vital Signs Height(in): 66 Pulse(bpm): 64 Weight(lbs): 161 Blood Pressure(mmHg): 123/62 Body Mass Index(BMI): 26 Temperature(F): 98.2 Respiratory Rate(breaths/min): 18 Photos: [N/A:N/A] Wound Location: Right, Lateral Ankle N/A N/A Wounding Event: Trauma N/A N/A Primary Etiology: Trauma, Other N/A N/A Comorbid History: Hypertension N/A N/A Date Acquired: 05/09/2021 N/A N/A Weeks of Treatment: 5 N/A N/A Wound Status: Open N/A N/A Measurements L x W x D (cm) 1.4x1.6x0.5 N/A N/A Area (cm) : 1.759 N/A N/A Volume (cm) : 0.88 N/A N/A % Reduction in Area: -124.10% N/A N/A % Reduction in Volume: -123.90% N/A N/A Classification: Full Thickness Without Exposed N/A N/A Support Structures Exudate Amount: Medium N/A N/A Exudate Type: Serosanguineous N/A N/A Exudate Color: red, brown N/A N/A Granulation Amount: Medium (34-66%) N/A N/A Granulation Quality: Red, Pink N/A N/A Necrotic Amount: Medium (34-66%) N/A N/A Exposed Structures: Fat Layer (Subcutaneous Tissue): N/A N/A Yes Fascia: No Tendon: No Muscle: No Joint: No Bone: No Epithelialization:  None N/A N/A Treatment Notes Electronic Signature(s) Signed: 07/23/2021 4:32:50 PM By: Carlene Coria RN Entered By: Carlene Coria on 07/23/2021 13:32:33 [name] ([id number]) -------------------------------------------------------------------------------- Multi-Disciplinary Care Plan Details Patient Name: [name] Date of Service: 07/23/2021 1:30 PM Medical Record Number: [id number] Patient Account Number: [id number] Date of Birth/Sex: May 17, 1961 (60 y.o. M) Treating RN: Carlene Coria Primary Care Caison Hearn: Halina Maidens Other Clinician: Referring Raphel Stickles: Halina Maidens Treating Necha Harries/Extender: Skipper Cliche in Treatment: 5 Active Inactive Wound/Skin Impairment Nursing Diagnoses: Knowledge deficit related to ulceration/compromised skin integrity Goals: Patient/caregiver will verbalize understanding of skin care regimen Date Initiated: 06/18/2021 Target Resolution Date: 08/18/2021 Goal Status:  Active Ulcer/skin breakdown will have a volume reduction of 30% by week 4 Date Initiated: 06/18/2021 Date Inactivated: 07/16/2021 Target Resolution Date: 07/18/2021 Goal Status: Unmet Unmet Reason: comorbities Ulcer/skin breakdown will have a volume reduction of 50% by week 8 Date Initiated: 06/18/2021 Target Resolution Date: 08/18/2021 Goal Status: Active Ulcer/skin breakdown will have a volume reduction of 80% by week 12 Date Initiated: 06/18/2021 Target Resolution Date: 09/18/2021 Goal Status: Active Ulcer/skin breakdown will heal within 14 weeks Date Initiated: 06/18/2021 Target Resolution Date: 10/18/2021 Goal Status: Active Interventions: Assess patient/caregiver ability to obtain necessary supplies Assess patient/caregiver ability to perform ulcer/skin care regimen upon admission and as needed Assess ulceration(s) every visit Notes: Electronic Signature(s) Signed: 07/23/2021 4:32:50 PM By: Carlene Coria RN Entered By: Carlene Coria on 07/23/2021  13:32:19 [name] ([id number]) -------------------------------------------------------------------------------- Pain Assessment Details Patient Name: [name] Date of Service: 07/23/2021 1:30 PM Medical Record Number: [id number] Patient Account Number: [id number] Date of Birth/Sex: 27-Apr-1961 (60 y.o. M) Treating RN: Carlene Coria Primary Care Zayvien Canning: Halina Maidens Other Clinician: Referring Floride Hutmacher: Halina Maidens Treating Jaidah Lomax/Extender: Skipper Cliche in Treatment: 5 Active Problems Location of Pain Severity and Description of Pain Patient Has Paino Yes Site Locations With Dressing Change: Yes Duration of the Pain. Constant / Intermittento Intermittent How Long Does it Lasto Hours: Minutes: 15 Rate the pain. Current Pain Level: 5 Worst Pain Level: 5 Least Pain Level: 0 Tolerable Pain Level: 5 Character of Pain Describe the Pain: Aching, Burning, Sharp Pain Management and Medication Current Pain Management: Medication: No Cold Application: No Rest: Yes Massage: No Activity: No T.E.N.S.: No Heat Application: No Leg drop or elevation: No Is the Current Pain Management Adequate: Inadequate How does your wound impact your activities of daily livingo Sleep: No Bathing: No Appetite: No Relationship With Others: No Bladder Continence: No Emotions: No Bowel Continence: No Work: No Toileting: No Drive: No Dressing: No Hobbies: No Electronic Signature(s) Signed: 07/23/2021 4:32:50 PM By: Carlene Coria RN Entered By: Carlene Coria on 07/23/2021 13:19:51 [name] ([id number]) -------------------------------------------------------------------------------- Patient/Caregiver Education Details Patient Name: [name] Date of Service: 07/23/2021 1:30 PM Medical Record Number: [id number] Patient Account Number: [id number] Date of Birth/Gender: September 29, 1961 (60 y.o. M) Treating RN: Carlene Coria Primary Care Physician: Halina Maidens Other Clinician: Referring Physician: Halina Maidens Treating Physician/Extender: Skipper Cliche in Treatment: 5 Education Assessment Education Provided To: Patient Education Topics Provided Wound/Skin Impairment: Methods: Explain/Verbal Responses: State content correctly Electronic Signature(s) Signed: 07/23/2021 4:32:50 PM By: Carlene Coria RN Entered By: Carlene Coria on 07/23/2021 13:36:48 [name] ([id number]) -------------------------------------------------------------------------------- Wound Assessment Details Patient Name: [name] Date of Service: 07/23/2021 1:30 PM Medical Record Number: [id number] Patient Account Number: [id number] Date of Birth/Sex: 1961/05/07 (60 y.o. M) Treating RN: Carlene Coria Primary Care Kristie Bracewell: Halina Maidens Other Clinician: Referring Pastor Sgro: Halina Maidens Treating Aralynn Brake/Extender: Jeri Cos Weeks in Treatment: 5 Wound Status Wound Number: 1 Primary Etiology: Trauma, Other Wound Location: Right, Lateral Ankle Wound Status: Open Wounding Event: Trauma Comorbid History: Hypertension Date Acquired: 05/09/2021 Weeks Of Treatment: 5 Clustered Wound: No Photos Wound Measurements Length: (cm) 1.4 Width: (cm) 1.6 Depth: (cm) 0.5 Area: (cm) 1.759 Volume: (cm) 0.88 % Reduction in Area: -124.1% % Reduction in Volume: -123.9% Epithelialization: None Tunneling: No Undermining: No Wound Description Classification: Full Thickness Without Exposed Support Structures Exudate Amount: Medium Exudate Type: Serosanguineous Exudate Color: red, brown Foul Odor After Cleansing: No Slough/Fibrino Yes Wound Bed Granulation Amount: Medium (34-66%) Exposed Structure Granulation Quality: Red, Pink Fascia  Exposed: No Necrotic Amount: Medium (34-66%) Fat Layer (Subcutaneous Tissue) Exposed: Yes Necrotic Quality: Adherent Slough Tendon Exposed: No Muscle Exposed: No Joint Exposed: No Bone Exposed: No Treatment  Notes Wound #1 (Ankle) Wound Laterality: Right, Lateral Cleanser Soap and Water Discharge Instruction: Gently cleanse wound with antibacterial soap, rinse and pat dry prior to dressing wounds Des [name], [name] ([id number]) Topical Primary Dressing Prisma 4.34 (in) Discharge Instruction: Moisten w/normal saline or sterile water; Cover wound as directed. Do not remove from wound bed. Secondary Dressing Zetuvit Plus Silicone Border Dressing 4x4 (in/in) Secured With Compression Wrap Compression Stockings Add-Ons Electronic Signature(s) Signed: 07/23/2021 4:32:50 PM By: Carlene Coria RN Entered By: Carlene Coria on 07/23/2021 13:23:23 [name] ([id number]) -------------------------------------------------------------------------------- [place] Details Patient Name: [name] Date of Service: 07/23/2021 1:30 PM Medical Record Number: [id number] Patient Account Number: [id number] Date of Birth/Sex: 09-22-1961 (60 y.o. M) Treating RN: Carlene Coria Primary Care Camarie Mctigue: Halina Maidens Other Clinician: Referring Song Garris: Halina Maidens Treating Yarelly Kuba/Extender: Skipper Cliche in Treatment: 5 Vital Signs Time Taken: 13:18 Temperature (F): 98.2 Height (in): 66 Pulse (bpm): 64 Weight (lbs): 161 Respiratory Rate (breaths/min): 18 Body Mass Index (BMI): 26 Blood Pressure (mmHg): 123/62 Reference Range: 80 - 120 mg / dl Electronic Signature(s) Signed: 07/23/2021 4:32:50 PM By: Carlene Coria RN Entered By: Carlene Coria on 07/23/2021 13:19:06

## 2021-07-24 NOTE — Progress Notes (Signed)
CORDERRA, VONSTEIN ([id number]) Visit Report for 07/16/2021 Arrival Information Details Patient Name: [name], [name] Date of Service: 07/16/2021 1:30 PM Medical Record Number: [id number] Patient Account Number: [id number] Date of Birth/Sex: 1961/01/16 (60 y.o. M) Treating RN: Carlene Coria Primary Care Jocelyne Reinertsen: Halina Maidens Other Clinician: Referring Erion Hermans: Halina Maidens Treating Anays Detore/Extender: Skipper Cliche in Treatment: 4 Visit Information History Since Last Visit All ordered tests and consults were completed: No Patient Arrived: Ambulatory Added or deleted any medications: No Arrival Time: 13:14 Any new allergies or adverse reactions: No Accompanied By: wife Had a fall or experienced change in No Transfer Assistance: None activities of daily living that may affect Patient Identification Verified: Yes risk of falls: Secondary Verification Process Completed: Yes Signs or symptoms of abuse/neglect since last visito No Patient Requires Transmission-Based Precautions: No Hospitalized since last visit: No Patient Has Alerts: Yes Implantable device outside of the clinic excluding No Patient Alerts: NOT diabetic cellular tissue based products placed in the center since last visit: Has Dressing in Place as Prescribed: Yes Pain Present Now: No Electronic Signature(s) Signed: 07/23/2021 4:33:49 PM By: Carlene Coria RN Entered By: Carlene Coria on 07/16/2021 13:17:32 [name] ([id number]) -------------------------------------------------------------------------------- Clinic Level of Care Assessment Details Patient Name: [name] Date of Service: 07/16/2021 1:30 PM Medical Record Number: [id number] Patient Account Number: [id number] Date of Birth/Sex: 08/12/1961 (60 y.o. M) Treating RN: Carlene Coria Primary Care Dove Gresham: Halina Maidens Other Clinician: Referring Briley Sulton: Halina Maidens Treating Teneshia Hedeen/Extender: Skipper Cliche in Treatment:  4 Clinic Level of Care Assessment Items TOOL 1 Quantity Score '[]'$  - Use when EandM and Procedure is performed on INITIAL visit 0 ASSESSMENTS - Nursing Assessment / Reassessment '[]'$  - General Physical Exam (combine w/ comprehensive assessment (listed just below) when performed on new 0 pt. evals) '[]'$  - 0 Comprehensive Assessment (HX, ROS, Risk Assessments, Wounds Hx, etc.) ASSESSMENTS - Wound and Skin Assessment / Reassessment '[]'$  - Dermatologic / Skin Assessment (not related to wound area) 0 ASSESSMENTS - Ostomy and/or Continence Assessment and Care '[]'$  - Incontinence Assessment and Management 0 '[]'$  - 0 Ostomy Care Assessment and Management (repouching, etc.) PROCESS - Coordination of Care '[]'$  - Simple Patient / Family Education for ongoing care 0 '[]'$  - 0 Complex (extensive) Patient / Family Education for ongoing care '[]'$  - 0 Staff obtains Programmer, systems, Records, Test Results / Process Orders '[]'$  - 0 Staff telephones HHA, Nursing Homes / Clarify orders / etc '[]'$  - 0 Routine Transfer to another Facility (non-emergent condition) '[]'$  - 0 Routine Hospital Admission (non-emergent condition) '[]'$  - 0 New Admissions / Biomedical engineer / Ordering NPWT, Apligraf, etc. '[]'$  - 0 Emergency Hospital Admission (emergent condition) PROCESS - Special Needs '[]'$  - Pediatric / Minor Patient Management 0 '[]'$  - 0 Isolation Patient Management '[]'$  - 0 Hearing / Language / Visual special needs '[]'$  - 0 Assessment of Community assistance (transportation, D/C planning, etc.) '[]'$  - 0 Additional assistance / Altered mentation '[]'$  - 0 Support Surface(s) Assessment (bed, cushion, seat, etc.) INTERVENTIONS - Miscellaneous '[]'$  - External ear exam 0 '[]'$  - 0 Patient Transfer (multiple staff / Civil Service fast streamer / Similar devices) '[]'$  - 0 Simple Staple / Suture removal (25 or less) '[]'$  - 0 Complex Staple / Suture removal (26 or more) '[]'$  - 0 Hypo/Hyperglycemic Management (do not check if billed separately) '[]'$  - 0 Ankle /  Brachial Index (ABI) - do not check if billed separately Has the patient been seen at the hospital within the last three years: Yes  Total Score: 0 Level Of Care: ____ [name] ([id number]) Electronic Signature(s) Signed: 07/23/2021 4:33:49 PM By: Carlene Coria RN Entered By: Carlene Coria on 07/16/2021 14:07:30 [name] ([id number]) -------------------------------------------------------------------------------- Encounter Discharge Information Details Patient Name: [name] Date of Service: 07/16/2021 1:30 PM Medical Record Number: [id number] Patient Account Number: [id number] Date of Birth/Sex: August 29, 1961 (60 y.o. M) Treating RN: Carlene Coria Primary Care Madge Therrien: Halina Maidens Other Clinician: Referring [name]: Halina Maidens Treating [name]/Extender: Skipper Cliche in Treatment: 4 Encounter Discharge Information Items Post Procedure Vitals Discharge Condition: Stable Temperature (F): 98.4 Ambulatory Status: Ambulatory Pulse (bpm): 71 Discharge Destination: Home Respiratory Rate (breaths/min): 18 Transportation: Private Auto Blood Pressure (mmHg): 134/77 Accompanied By: wife Schedule Follow-up Appointment: Yes Clinical Summary of Care: Patient Declined Electronic Signature(s) Signed: 07/23/2021 4:33:49 PM By: Carlene Coria RN Entered By: Carlene Coria on 07/16/2021 14:08:48 [name] ([id number]) -------------------------------------------------------------------------------- Lower Extremity Assessment Details Patient Name: [name] Date of Service: 07/16/2021 1:30 PM Medical Record Number: [id number] Patient Account Number: [id number] Date of Birth/Sex: 1961-11-30 (60 y.o. M) Treating RN: Carlene Coria Primary Care [name]: Halina Maidens Other Clinician: Referring [name]: Halina Maidens Treating Asucena Galer/Extender: Jeri Cos Weeks in Treatment: 4 Edema Assessment Assessed: Shirlyn Goltz: Yes] [Right: No] Edema: [Left: Ye]  [Right: s] Calf Left: Right: Point of Measurement: 37 cm From Medial Instep 29 cm Ankle Left: Right: Point of Measurement: 10 cm From Medial Instep 21 cm Vascular Assessment Pulses: Dorsalis Pedis Palpable: [Right:Yes] Electronic Signature(s) Signed: 07/23/2021 4:33:49 PM By: Carlene Coria RN Entered By: Carlene Coria on 07/16/2021 13:23:39 [name] ([id number]) -------------------------------------------------------------------------------- Multi Wound Chart Details Patient Name: [name] Date of Service: 07/16/2021 1:30 PM Medical Record Number: [id number] Patient Account Number: [id number] Date of Birth/Sex: 03/12/1961 (60 y.o. M) Treating RN: Carlene Coria Primary Care Werner Labella: Halina Maidens Other Clinician: Referring Amaiyah Nordhoff: Halina Maidens Treating Krystyna Cleckley/Extender: Jeri Cos Weeks in Treatment: 4 Vital Signs Height(in): 66 Pulse(bpm): 71 Weight(lbs): 161 Blood Pressure(mmHg): 134/77 Body Mass Index(BMI): 26 Temperature(F): 98.4 Respiratory Rate(breaths/min): 18 Photos: [N/A:N/A] Wound Location: Right, Lateral Ankle N/A N/A Wounding Event: Trauma N/A N/A Primary Etiology: Trauma, Other N/A N/A Comorbid History: Hypertension N/A N/A Date Acquired: 05/09/2021 N/A N/A Weeks of Treatment: 4 N/A N/A Wound Status: Open N/A N/A Measurements L x W x D (cm) 1.3x1.4x0.5 N/A N/A Area (cm) : 1.429 N/A N/A Volume (cm) : 0.715 N/A N/A % Reduction in Area: -82.00% N/A N/A % Reduction in Volume: -81.90% N/A N/A Classification: Full Thickness Without Exposed N/A N/A Support Structures Exudate Amount: Medium N/A N/A Exudate Type: Serosanguineous N/A N/A Exudate Color: red, brown N/A N/A Granulation Amount: Medium (34-66%) N/A N/A Granulation Quality: Red, Pink N/A N/A Necrotic Amount: Medium (34-66%) N/A N/A Exposed Structures: Fat Layer (Subcutaneous Tissue): N/A N/A Yes Fascia: No Tendon: No Muscle: No Joint: No Bone: No Epithelialization:  None N/A N/A Treatment Notes Electronic Signature(s) Signed: 07/23/2021 4:33:49 PM By: Carlene Coria RN Entered By: Carlene Coria on 07/16/2021 14:00:28 [name] ([id number]) -------------------------------------------------------------------------------- Multi-Disciplinary Care Plan Details Patient Name: [name] Date of Service: 07/16/2021 1:30 PM Medical Record Number: [id number] Patient Account Number: [id number] Date of Birth/Sex: 1961/03/28 (60 y.o. M) Treating RN: Carlene Coria Primary Care Calvin Jablonowski: Halina Maidens Other Clinician: Referring Kaileb Monsanto: Halina Maidens Treating Javeion Cannedy/Extender: Skipper Cliche in Treatment: 4 Active Inactive Wound/Skin Impairment Nursing Diagnoses: Knowledge deficit related to ulceration/compromised skin integrity Goals: Patient/caregiver will verbalize understanding of skin care regimen Date Initiated: 06/18/2021 Target Resolution Date: 08/18/2021 Goal Status:  Active Ulcer/skin breakdown will have a volume reduction of 30% by week 4 Date Initiated: 06/18/2021 Date Inactivated: 07/16/2021 Target Resolution Date: 07/18/2021 Goal Status: Unmet Unmet Reason: comorbities Ulcer/skin breakdown will have a volume reduction of 50% by week 8 Date Initiated: 06/18/2021 Target Resolution Date: 08/18/2021 Goal Status: Active Ulcer/skin breakdown will have a volume reduction of 80% by week 12 Date Initiated: 06/18/2021 Target Resolution Date: 09/18/2021 Goal Status: Active Ulcer/skin breakdown will heal within 14 weeks Date Initiated: 06/18/2021 Target Resolution Date: 10/18/2021 Goal Status: Active Interventions: Assess patient/caregiver ability to obtain necessary supplies Assess patient/caregiver ability to perform ulcer/skin care regimen upon admission and as needed Assess ulceration(s) every visit Notes: Electronic Signature(s) Signed: 07/23/2021 4:33:49 PM By: Carlene Coria RN Entered By: Carlene Coria on 07/16/2021  14:00:14 [name] ([id number]) -------------------------------------------------------------------------------- Pain Assessment Details Patient Name: [name] Date of Service: 07/16/2021 1:30 PM Medical Record Number: [id number] Patient Account Number: [id number] Date of Birth/Sex: 1961/11/10 (60 y.o. M) Treating RN: Carlene Coria Primary Care Jayliah Benett: Halina Maidens Other Clinician: Referring Vanesa Renier: Halina Maidens Treating Kathrynne Kulinski/Extender: Jeri Cos Weeks in Treatment: 4 Active Problems Location of Pain Severity and Description of Pain Patient Has Paino No Site Locations Pain Management and Medication Current Pain Management: Electronic Signature(s) Signed: 07/23/2021 4:33:49 PM By: Carlene Coria RN Entered By: Carlene Coria on 07/16/2021 13:18:04 [name] ([id number]) -------------------------------------------------------------------------------- Patient/Caregiver Education Details Patient Name: [name] Date of Service: 07/16/2021 1:30 PM Medical Record Number: [id number] Patient Account Number: [id number] Date of Birth/Gender: [birth year] (60 y.o. M) Treating RN: Carlene Coria Primary Care Physician: Halina Maidens Other Clinician: Referring Physician: Halina Maidens Treating Physician/Extender: Skipper Cliche in Treatment: 4 Education Assessment Education Provided To: Patient Education Topics Provided Wound/Skin Impairment: Methods: Explain/Verbal Responses: State content correctly Electronic Signature(s) Signed: 07/23/2021 4:33:49 PM By: Carlene Coria RN Entered By: Carlene Coria on 07/16/2021 14:07:51 [name] ([id number]) -------------------------------------------------------------------------------- Wound Assessment Details Patient Name: [name] Date of Service: 07/16/2021 1:30 PM Medical Record Number: [id number] Patient Account Number: [id number] Date of Birth/Sex: 1961/05/05 (60 y.o. M) Treating  RN: Carlene Coria Primary Care Peityn Payton: Halina Maidens Other Clinician: Referring Neftali Abair: Halina Maidens Treating Miguel Christiana/Extender: Jeri Cos Weeks in Treatment: 4 Wound Status Wound Number: 1 Primary Etiology: Trauma, Other Wound Location: Right, Lateral Ankle Wound Status: Open Wounding Event: Trauma Comorbid History: Hypertension Date Acquired: 05/09/2021 Weeks Of Treatment: 4 Clustered Wound: No Photos Wound Measurements Length: (cm) 1.3 Width: (cm) 1.4 Depth: (cm) 0.5 Area: (cm) 1.429 Volume: (cm) 0.715 % Reduction in Area: -82% % Reduction in Volume: -81.9% Epithelialization: None Tunneling: No Undermining: No Wound Description Classification: Full Thickness Without Exposed Support Structu Exudate Amount: Medium Exudate Type: Serosanguineous Exudate Color: red, brown res Foul Odor After Cleansing: No Slough/Fibrino Yes Wound Bed Granulation Amount: Medium (34-66%) Exposed Structure Granulation Quality: Red, Pink Fascia Exposed: No Necrotic Amount: Medium (34-66%) Fat Layer (Subcutaneous Tissue) Exposed: Yes Necrotic Quality: Adherent Slough Tendon Exposed: No Muscle Exposed: No Joint Exposed: No Bone Exposed: No Electronic Signature(s) Signed: 07/23/2021 4:33:49 PM By: Carlene Coria RN Entered By: Carlene Coria on 07/16/2021 13:22:45 [name] ([id number]) -------------------------------------------------------------------------------- Vitals Details Patient Name: [name] Date of Service: 07/16/2021 1:30 PM Medical Record Number: [id number] Patient Account Number: [id number] Date of Birth/Sex: 1961/11/07 (60 y.o. M) Treating RN: Carlene Coria Primary Care Danta Baumgardner: Halina Maidens Other Clinician: Referring Reisha Wos: Halina Maidens Treating Nataliyah Packham/Extender: Jeri Cos Weeks in Treatment: 4 Vital Signs Time Taken: 13:17 Temperature (F): 98.4 Height (in): 66 Pulse (  bpm): 71 Weight (lbs): 161 Respiratory Rate  (breaths/min): 18 Body Mass Index (BMI): 26 Blood Pressure (mmHg): 134/77 Reference Range: 80 - 120 mg / dl Electronic Signature(s) Signed: 07/23/2021 4:33:49 PM By: Carlene Coria RN Entered By: Carlene Coria on 07/16/2021 13:17:55

## 2021-07-30 ENCOUNTER — Encounter: Payer: Managed Care, Other (non HMO) | Attending: Physician Assistant | Admitting: Physician Assistant

## 2021-07-30 ENCOUNTER — Other Ambulatory Visit: Payer: Self-pay

## 2021-07-30 DIAGNOSIS — I10 Essential (primary) hypertension: Secondary | ICD-10-CM | POA: Insufficient documentation

## 2021-07-30 DIAGNOSIS — S91011D Laceration without foreign body, right ankle, subsequent encounter: Secondary | ICD-10-CM | POA: Insufficient documentation

## 2021-07-30 DIAGNOSIS — L97312 Non-pressure chronic ulcer of right ankle with fat layer exposed: Secondary | ICD-10-CM | POA: Insufficient documentation

## 2021-07-30 DIAGNOSIS — S91011A Laceration without foreign body, right ankle, initial encounter: Secondary | ICD-10-CM | POA: Diagnosis present

## 2021-07-30 NOTE — Progress Notes (Addendum)
BURNES, SOULES ([id number]) Visit Report for 07/30/2021 Chief Complaint Document Details Patient Name: [name], [name]. Date of Service: 07/30/2021 1:30 PM Medical Record Number: [id number] Patient Account Number: [id number] Date of Birth/Sex: 1961-03-30 (60 y.o. M) Treating RN: Carlene Coria Primary Care Provider: Halina Maidens Other Clinician: Referring Provider: Halina Maidens Treating Provider/Extender: Skipper Cliche in Treatment: 6 Information Obtained from: Patient Chief Complaint Right ankle laceration Electronic Signature(s) Signed: 07/30/2021 1:30:29 PM By: Worthy Keeler PA-C Entered By: Worthy Keeler on 07/30/2021 13:30:29 [name] ([id number]) -------------------------------------------------------------------------------- Debridement Details Patient Name: [name] Date of Service: 07/30/2021 1:30 PM Medical Record Number: [id number] Patient Account Number: [id number] Date of Birth/Sex: September 16, 1961 (60 y.o. M) Treating RN: Carlene Coria Primary Care Provider: Halina Maidens Other Clinician: Referring Provider: Halina Maidens Treating Provider/Extender: Jeri Cos Weeks in Treatment: 6 Debridement Performed for Wound #1 Right,Lateral Ankle Assessment: Performed By: Physician Tommie Sams., PA-C Debridement Type: Debridement Level of Consciousness (Pre- Awake and Alert procedure): Pre-procedure Verification/Time Out Yes - 14:50 Taken: Start Time: 14:50 Pain Control: Lidocaine 4% Topical Solution Total Area Debrided (L x W): 1 (cm) x 1.5 (cm) = 1.5 (cm) Tissue and other material Viable, Non-Viable, Slough, Subcutaneous, Skin: Dermis , Skin: Epidermis, Slough debrided: Level: Skin/Subcutaneous Tissue Debridement Description: Excisional Instrument: Curette Bleeding: Moderate Hemostasis Achieved: Silver Nitrate End Time: 14:54 Procedural Pain: 0 Post Procedural Pain: 0 Response to Treatment: Procedure was tolerated well Level of  Consciousness (Post- Awake and Alert procedure): Post Debridement Measurements of Total Wound Length: (cm) 1 Width: (cm) 1.5 Depth: (cm) 0.5 Volume: (cm) 0.589 Character of Wound/Ulcer Post Debridement: Improved Post Procedure Diagnosis Same as Pre-procedure Electronic Signature(s) Signed: 07/30/2021 6:21:30 PM By: Worthy Keeler PA-C Signed: 08/05/2021 8:40:56 PM By: Carlene Coria RN Entered By: Carlene Coria on 07/30/2021 13:56:39 [name] ([id number]) -------------------------------------------------------------------------------- HPI Details Patient Name: [name] Date of Service: 07/30/2021 1:30 PM Medical Record Number: [id number] Patient Account Number: [id number] Date of Birth/Sex: [birth year] (60 y.o. M) Treating RN: Carlene Coria Primary Care Provider: Halina Maidens Other Clinician: Referring Provider: Halina Maidens Treating Provider/Extender: Skipper Cliche in Treatment: 6 History of Present Illness HPI Description: 06/14/2021 upon evaluation today patient appears to be doing somewhat poorly in regard to the wound on his right ankle region. This was an injury that apparently occurred at work according to what he is telling me today. He is seen with his wife in the office at this point. Nonetheless the patient appears to have a circular opening with necrotic tissue in the base of the wound. This does not appear to be doing nearly as good as I would hope. Fortunately there does not appear to be any signs of infection though he does have a significant amount of necrotic tissue this is going to take some time to heal. He also has previously had surgery in this area there is a lot of scar tissue that will again complicate things and extended healing process. The patient does have hypertension otherwise no major medical problems. 06/25/2021 upon evaluation today patient presents for reevaluation here in our clinic he actually does seem to be doing better in regard  to his wound on the right lateral ankle. He has been tolerating the dressing changes without complication. Fortunately I do not see any signs of infection which is great news. 07/02/2021 upon evaluation today patient appears to be doing about the same in regard to his wound. With that being said I do believe that this  is cleaner there was a little bit of slough noted we have been using Iodoflex. I was able to see a little bit more granulation tissue starting to try to build up but I think the scar tissue here is going to be the biggest slowing factor with regard to healing. That was discussed with the patient and his wife today. Nonetheless I think we want to switch up things based on what I see at this point. 07/09/2021 upon evaluation today patient appears to be doing about the same in regard to his ankle ulcer. He has been tolerating the dressing changes without complication. Fortunately there does not appear to be any signs of active infection. No fevers, chills, nausea, vomiting, or diarrhea. 07/16/2021 upon evaluation today patient appears to be doing better to some degree in regard to his wound on ankle region. He actually showed some signs of new granulation growth which is actually a good thing. Fortunately there does not appear to be any evidence of active infection and wound did not appear to be too dry upon evaluation today which is also good news. No fevers, chills, nausea, vomiting, or diarrhea. 07/23/2021 upon evaluation today patient appears to be doing a little better in regard to his wound. He has been tolerating the dressing changes without complication. Fortunately there does not appear to be any signs of active infection which is great news. No fever chills noted we are still waiting on the results back from the TheraSkin investigation to see whether or not he qualifies for this treatment option. 07/30/2021 upon evaluation today patient appears to be doing a little better again in  regard to his wound. He is showing signs of improvement which is great news. I am very pleased with where things stand. I do believe that honestly all things considered he is making good progress but were just not able to do anything as far as advanced modalities to speed this up as insurance has literally devout denied everything including a snap VAC, Apligraf, PuraPly, and TheraSkin. Again I am not really certain what else to do otherwise other than just try to continue along the path we are as far as getting this wound to heal. Electronic Signature(s) Signed: 07/30/2021 3:32:45 PM By: Worthy Keeler PA-C Entered By: Worthy Keeler on 07/30/2021 15:32:45 [name] ([id number]) -------------------------------------------------------------------------------- Physical Exam Details Patient Name: [name] Date of Service: 07/30/2021 1:30 PM Medical Record Number: [id number] Patient Account Number: [id number] Date of Birth/Sex: 1961/03/15 (60 y.o. M) Treating RN: Carlene Coria Primary Care Provider: Halina Maidens Other Clinician: Referring Provider: Halina Maidens Treating Provider/Extender: Jeri Cos Weeks in Treatment: 6 Constitutional Well-nourished and well-hydrated in no acute distress. Respiratory normal breathing without difficulty. Psychiatric this patient is able to make decisions and demonstrates good insight into disease process. Alert and Oriented x 3. pleasant and cooperative. Notes Upon inspection patient's wound bed actually showed signs of good granulation epithelization at this point and I do see evidence of new granular growth each week which is at least good news. I do think however we could speed things up if we were able to use one of the advanced modalities as far as getting close sooner. Electronic Signature(s) Signed: 07/30/2021 3:33:03 PM By: Worthy Keeler PA-C Entered By: Worthy Keeler on 07/30/2021 15:33:03 [name]  ([id number]) -------------------------------------------------------------------------------- Physician Orders Details Patient Name: [name] Date of Service: 07/30/2021 1:30 PM Medical Record Number: [id number] Patient Account Number: [id number] Date of Birth/Sex: [birth year] (59  y.o. M) Treating RN: Carlene Coria Primary Care Provider: Halina Maidens Other Clinician: Referring Provider: Halina Maidens Treating Provider/Extender: Skipper Cliche in Treatment: 6 Verbal / Phone Orders: No Diagnosis Coding ICD-10 Coding Code Description [id number] Laceration without foreign body, right ankle, initial encounter L97.312 Non-pressure chronic ulcer of right ankle with fat layer exposed [place] (primary) hypertension Follow-up Appointments o Return Appointment in 1 week. Bathing/ Shower/ Hygiene o May shower; gently cleanse wound with antibacterial soap, rinse and pat dry prior to dressing wounds Edema Control - Lymphedema / Segmental Compressive Device / Other o Elevate, Exercise Daily and Avoid Standing for Long Periods of Time. o Elevate legs to the level of the heart and pump ankles as often as possible o Elevate leg(s) parallel to the floor when sitting. Wound Treatment Wound #1 - Ankle Wound Laterality: Right, Lateral Cleanser: Soap and Water 3 x Per Week/30 Days Discharge Instructions: Gently cleanse wound with antibacterial soap, rinse and pat dry prior to dressing wounds Primary Dressing: Prisma 4.34 (in) (Generic) 3 x Per Week/30 Days Discharge Instructions: Moisten w/normal saline or sterile water; Cover wound as directed. Do not remove from wound bed. Secondary Dressing: Gauze 3 x Per Week/30 Days Discharge Instructions: As directed: dry, moistened with saline or moistened with Dakins Solution Secondary Dressing: coban 3 x Per Week/30 Days Electronic Signature(s) Signed: 07/30/2021 6:21:30 PM By: Worthy Keeler PA-C Signed: 08/05/2021 8:40:56 PM By: Carlene Coria RN Entered By: Carlene Coria on 07/30/2021 13:58:30 [name] ([id number]) -------------------------------------------------------------------------------- Problem List Details Patient Name: [name] Date of Service: 07/30/2021 1:30 PM Medical Record Number: [id number] Patient Account Number: [id number] Date of Birth/Sex: [birth year] (60 y.o. M) Treating RN: Carlene Coria Primary Care Provider: Halina Maidens Other Clinician: Referring Provider: Halina Maidens Treating Provider/Extender: Skipper Cliche in Treatment: 6 Active Problems ICD-10 Encounter Code Description Active Date MDM Diagnosis S91.011A Laceration without foreign body, right ankle, initial encounter 06/18/2021 No Yes L97.312 Non-pressure chronic ulcer of right ankle with fat layer exposed 06/18/2021 No Yes I10 Essential (primary) hypertension 06/18/2021 No Yes Inactive Problems Resolved Problems Electronic Signature(s) Signed: 07/30/2021 1:30:08 PM By: Worthy Keeler PA-C Entered By: Worthy Keeler on 07/30/2021 13:30:07 [name] ([id number]) -------------------------------------------------------------------------------- Progress Note Details Patient Name: [name] Date of Service: 07/30/2021 1:30 PM Medical Record Number: [id number] Patient Account Number: [id number] Date of Birth/Sex: 07/23/1961 (60 y.o. M) Treating RN: Carlene Coria Primary Care Provider: Halina Maidens Other Clinician: Referring Provider: Halina Maidens Treating Provider/Extender: Skipper Cliche in Treatment: 6 Subjective Chief Complaint Information obtained from Patient Right ankle laceration History of Present Illness (HPI) 06/14/2021 upon evaluation today patient appears to be doing somewhat poorly in regard to the wound on his right ankle region. This was an injury that apparently occurred at work according to what he is telling me today. He is seen with his wife in the office at this point.  Nonetheless the patient appears to have a circular opening with necrotic tissue in the base of the wound. This does not appear to be doing nearly as good as I would hope. Fortunately there does not appear to be any signs of infection though he does have a significant amount of necrotic tissue this is going to take some time to heal. He also has previously had surgery in this area there is a lot of scar tissue that will again complicate things and extended healing process. The patient does have hypertension otherwise no major medical problems. 06/25/2021 upon  evaluation today patient presents for reevaluation here in our clinic he actually does seem to be doing better in regard to his wound on the right lateral ankle. He has been tolerating the dressing changes without complication. Fortunately I do not see any signs of infection which is great news. 07/02/2021 upon evaluation today patient appears to be doing about the same in regard to his wound. With that being said I do believe that this is cleaner there was a little bit of slough noted we have been using Iodoflex. I was able to see a little bit more granulation tissue starting to try to build up but I think the scar tissue here is going to be the biggest slowing factor with regard to healing. That was discussed with the patient and his wife today. Nonetheless I think we want to switch up things based on what I see at this point. 07/09/2021 upon evaluation today patient appears to be doing about the same in regard to his ankle ulcer. He has been tolerating the dressing changes without complication. Fortunately there does not appear to be any signs of active infection. No fevers, chills, nausea, vomiting, or diarrhea. 07/16/2021 upon evaluation today patient appears to be doing better to some degree in regard to his wound on ankle region. He actually showed some signs of new granulation growth which is actually a good thing. Fortunately there does not  appear to be any evidence of active infection and wound did not appear to be too dry upon evaluation today which is also good news. No fevers, chills, nausea, vomiting, or diarrhea. 07/23/2021 upon evaluation today patient appears to be doing a little better in regard to his wound. He has been tolerating the dressing changes without complication. Fortunately there does not appear to be any signs of active infection which is great news. No fever chills noted we are still waiting on the results back from the TheraSkin investigation to see whether or not he qualifies for this treatment option. 07/30/2021 upon evaluation today patient appears to be doing a little better again in regard to his wound. He is showing signs of improvement which is great news. I am very pleased with where things stand. I do believe that honestly all things considered he is making good progress but were just not able to do anything as far as advanced modalities to speed this up as insurance has literally devout denied everything including a snap VAC, Apligraf, PuraPly, and TheraSkin. Again I am not really certain what else to do otherwise other than just try to continue along the path we are as far as getting this wound to heal. Objective Constitutional Well-nourished and well-hydrated in no acute distress. Vitals Time Taken: 1:33 PM, Height: 66 in, Weight: 161 lbs, BMI: 26, Temperature: 98.1 F, Pulse: 61 bpm, Respiratory Rate: 18 breaths/min, Blood Pressure: 110/68 mmHg. Respiratory normal breathing without difficulty. Psychiatric this patient is able to make decisions and demonstrates good insight into disease process. Alert and Oriented x 3. pleasant and cooperative. [name], [name] ([id number]) General Notes: Upon inspection patient's wound bed actually showed signs of good granulation epithelization at this point and I do see evidence of new granular growth each week which is at least good news. I do think however we  could speed things up if we were able to use one of the advanced modalities as far as getting close sooner. Integumentary (Hair, Skin) Wound #1 status is Open. Original cause of wound was Trauma. The date acquired was:  05/09/2021. The wound has been in treatment 6 weeks. The wound is located on the Right,Lateral Ankle. The wound measures 1cm length x 1.5cm width x 0.5cm depth; 1.178cm^2 area and 0.589cm^3 volume. There is Fat Layer (Subcutaneous Tissue) exposed. There is no tunneling or undermining noted. There is a medium amount of serosanguineous drainage noted. There is medium (34-66%) red, pink granulation within the wound bed. There is a medium (34-66%) amount of necrotic tissue within the wound bed including Adherent Slough. Assessment Active Problems ICD-10 Laceration without foreign body, right ankle, initial encounter Non-pressure chronic ulcer of right ankle with fat layer exposed Ess[place] (primary) hypertension Procedures Wound #1 Pre-procedure diagnosis of Wound #1 is a Trauma, Other located on the Right,Lateral Ankle . There was a Excisional Skin/Subcutaneous Tissue Debridement with a total area of 1.5 sq cm performed by Tommie Sams., PA-C. With the following instrument(s): Curette to remove Viable and Non-Viable tissue/material. Material removed includes Subcutaneous Tissue, Slough, Skin: Dermis, and Skin: Epidermis after achieving pain control using Lidocaine 4% Topical Solution. No specimens were taken. A time out was conducted at 14:50, prior to the start of the procedure. A Moderate amount of bleeding was controlled with Silver Nitrate. The procedure was tolerated well with a pain level of 0 throughout and a pain level of 0 following the procedure. Post Debridement Measurements: 1cm length x 1.5cm width x 0.5cm depth; 0.589cm^3 volume. Character of Wound/Ulcer Post Debridement is improved. Post procedure Diagnosis Wound #1: Same as Pre-Procedure Plan Follow-up  Appointments: Return Appointment in 1 week. Bathing/ Shower/ Hygiene: May shower; gently cleanse wound with antibacterial soap, rinse and pat dry prior to dressing wounds Edema Control - Lymphedema / Segmental Compressive Device / Other: Elevate, Exercise Daily and Avoid Standing for Long Periods of Time. Elevate legs to the level of the heart and pump ankles as often as possible Elevate leg(s) parallel to the floor when sitting. WOUND #1: - Ankle Wound Laterality: Right, Lateral Cleanser: Soap and Water 3 x Per Week/30 Days Discharge Instructions: Gently cleanse wound with antibacterial soap, rinse and pat dry prior to dressing wounds Primary Dressing: Prisma 4.34 (in) (Generic) 3 x Per Week/30 Days Discharge Instructions: Moisten w/normal saline or sterile water; Cover wound as directed. Do not remove from wound bed. Secondary Dressing: Gauze 3 x Per Week/30 Days Discharge Instructions: As directed: dry, moistened with saline or moistened with Dakins Solution Secondary Dressing: coban 3 x Per Week/30 Days 1. Would recommend currently that we going continue with the wound care measures as before using the silver collagen with foam packed and behind I think this is doing a good job. 2. I am also can recommend that we use the Curlex and Coban to secure in place that seems to be doing best for him he really did not do well with the Zetuvit he states that it stuck too well to the wound/periwound region. 3. Unfortunately advanced modalities are just not to be a possibility here as insurance will not cover anything. We will see patient back for reevaluation in 1 week here in the clinic. If anything worsens or changes patient will contact our office for additional recommendations. [name], [name] ([id number]) Electronic Signature(s) Signed: 07/30/2021 3:33:41 PM By: Worthy Keeler PA-C Entered By: Worthy Keeler on 07/30/2021 15:33:41 [name]  ([id number]) -------------------------------------------------------------------------------- SuperBill Details Patient Name: [name] Date of Service: 07/30/2021 Medical Record Number: [id number] Patient Account Number: [id number] Date of Birth/Sex: 1961-03-31 (60 y.o. M) Treating RN: Carlene Coria Primary  Care Provider: Halina Maidens Other Clinician: Referring Provider: Halina Maidens Treating Provider/Extender: Jeri Cos Weeks in Treatment: 6 Diagnosis Coding ICD-10 Codes Code Description [phone] Laceration without foreign body, right ankle, initial encounter [id number] Non-pressure chronic ulcer of right ankle with fat layer exposed [place] (primary) hypertension Facility Procedures CPT4 Code: [id number] Description: [phone] - WOUND CARE VISIT-LEV 2 EST PT Modifier: Quantity: 1 CPT4 Code: [id number] Description: [id number] - DEB SUBQ TISSUE 20 SQ CM/< Modifier: Quantity: 1 CPT4 Code: Description: ICD-10 Diagnosis Description [id number] Non-pressure chronic ulcer of right ankle with fat layer exposed S91.011A Laceration without foreign body, right ankle, initial encounter Modifier: Quantity: Physician Procedures CPT4 [id number] Description: 11042 - WC PHYS SUBQ TISS 20 SQ CM Modifier: Quantity: 1 CPT4 Code: Description: ICD-10 Diagnosis Description [id number] Non-pressure chronic ulcer of right ankle with fat layer exposed S91.011A Laceration without foreign body, right ankle, initial encounter Modifier: Quantity: Electronic Signature(s) Signed: 07/30/2021 3:34:13 PM By: Worthy Keeler PA-C Entered By: Worthy Keeler on 07/30/2021 15:34:13

## 2021-07-30 NOTE — Progress Notes (Addendum)
[name], [name] ([id number]) Visit Report for 07/30/2021 Arrival Information Details Patient Name: [name], [name] Date of Service: 07/30/2021 1:30 PM Medical Record Number: [id number] Patient Account Number: [id number] Date of Birth/Sex: Oct 29, 1961 (60 y.o. M) Treating RN: Carlene Coria Primary Care Javed Cotto: Halina Maidens Other Clinician: Referring Osby Sweetin: Halina Maidens Treating Nanetta Wiegman/Extender: Skipper Cliche in Treatment: 6 Visit Information History Since Last Visit All ordered tests and consults were completed: No Patient Arrived: Ambulatory Added or deleted any medications: No Arrival Time: 13:30 Any new allergies or adverse reactions: No Accompanied By: wife Had a fall or experienced change in No Transfer Assistance: None activities of daily living that may affect Patient Identification Verified: Yes risk of falls: Secondary Verification Process Completed: Yes Signs or symptoms of abuse/neglect since last visito No Patient Requires Transmission-Based Precautions: No Hospitalized since last visit: No Patient Has Alerts: Yes Implantable device outside of the clinic excluding No Patient Alerts: NOT diabetic cellular tissue based products placed in the center since last visit: Has Dressing in Place as Prescribed: Yes Pain Present Now: No Electronic Signature(s) Signed: 08/05/2021 8:40:56 PM By: Carlene Coria RN Entered By: Carlene Coria on 07/30/2021 13:33:30 [name] ([id number]) -------------------------------------------------------------------------------- Clinic Level of Care Assessment Details Patient Name: [name] Date of Service: 07/30/2021 1:30 PM Medical Record Number: [id number] Patient Account Number: [id number] Date of Birth/Sex: [birth year] (60 y.o. M) Treating RN: Carlene Coria Primary Care Olivya Sobol: Halina Maidens Other Clinician: Referring Gerrit Rafalski: Halina Maidens Treating Shianna Bally/Extender: Skipper Cliche in Treatment:  6 Clinic Level of Care Assessment Items TOOL 4 Quantity Score X - Use when only an EandM is performed on FOLLOW-UP visit 1 0 ASSESSMENTS - Nursing Assessment / Reassessment '[]'$  - Reassessment of Co-morbidities (includes updates in patient status) 0 '[]'$  - 0 Reassessment of Adherence to Treatment Plan ASSESSMENTS - Wound and Skin Assessment / Reassessment X - Simple Wound Assessment / Reassessment - one wound 1 5 '[]'$  - 0 Complex Wound Assessment / Reassessment - multiple wounds '[]'$  - 0 Dermatologic / Skin Assessment (not related to wound area) ASSESSMENTS - Focused Assessment '[]'$  - Circumferential Edema Measurements - multi extremities 0 '[]'$  - 0 Nutritional Assessment / Counseling / Intervention '[]'$  - 0 Lower Extremity Assessment (monofilament, tuning fork, pulses) '[]'$  - 0 Peripheral Arterial Disease Assessment (using hand held doppler) ASSESSMENTS - Ostomy and/or Continence Assessment and Care '[]'$  - Incontinence Assessment and Management 0 '[]'$  - 0 Ostomy Care Assessment and Management (repouching, etc.) PROCESS - Coordination of Care X - Simple Patient / Family Education for ongoing care 1 15 '[]'$  - 0 Complex (extensive) Patient / Family Education for ongoing care X- 1 10 Staff obtains Programmer, systems, Records, Test Results / Process Orders '[]'$  - 0 Staff telephones HHA, Nursing Homes / Clarify orders / etc '[]'$  - 0 Routine Transfer to another Facility (non-emergent condition) '[]'$  - 0 Routine Hospital Admission (non-emergent condition) '[]'$  - 0 New Admissions / Biomedical engineer / Ordering NPWT, Apligraf, etc. '[]'$  - 0 Emergency Hospital Admission (emergent condition) X- 1 10 Simple Discharge Coordination '[]'$  - 0 Complex (extensive) Discharge Coordination PROCESS - Special Needs '[]'$  - Pediatric / Minor Patient Management 0 '[]'$  - 0 Isolation Patient Management '[]'$  - 0 Hearing / Language / Visual special needs '[]'$  - 0 Assessment of Community assistance (transportation, D/C planning, etc.) '[]'$   - 0 Additional assistance / Altered mentation '[]'$  - 0 Support Surface(s) Assessment (bed, cushion, seat, etc.) INTERVENTIONS - Wound Cleansing / Measurement [name], [name]. ([id number]) X- 1 5 Simple  Wound Cleansing - one wound '[]'$  - 0 Complex Wound Cleansing - multiple wounds '[]'$  - 0 Wound Imaging (photographs - any number of wounds) '[]'$  - 0 Wound Tracing (instead of photographs) X- 1 5 Simple Wound Measurement - one wound '[]'$  - 0 Complex Wound Measurement - multiple wounds INTERVENTIONS - Wound Dressings X - Small Wound Dressing one or multiple wounds 1 10 '[]'$  - 0 Medium Wound Dressing one or multiple wounds '[]'$  - 0 Large Wound Dressing one or multiple wounds X- 1 5 Application of Medications - topical '[]'$  - 0 Application of Medications - injection INTERVENTIONS - Miscellaneous '[]'$  - External ear exam 0 '[]'$  - 0 Specimen Collection (cultures, biopsies, blood, body fluids, etc.) '[]'$  - 0 Specimen(s) / Culture(s) sent or taken to Lab for analysis '[]'$  - 0 Patient Transfer (multiple staff / Civil Service fast streamer / Similar devices) '[]'$  - 0 Simple Staple / Suture removal (25 or less) '[]'$  - 0 Complex Staple / Suture removal (26 or more) '[]'$  - 0 Hypo / Hyperglycemic Management (close monitor of Blood Glucose) '[]'$  - 0 Ankle / Brachial Index (ABI) - do not check if billed separately X- 1 5 Vital Signs Has the patient been seen at the hospital within the last three years: Yes Total Score: 70 Level Of Care: New/Established - Level 2 Electronic Signature(s) Signed: 08/05/2021 8:40:56 PM By: Carlene Coria RN Entered By: Carlene Coria on 07/30/2021 13:52:08 [name] ([id number]) -------------------------------------------------------------------------------- Encounter Discharge Information Details Patient Name: [name] Date of Service: 07/30/2021 1:30 PM Medical Record Number: [id number] Patient Account Number: [id number] Date of Birth/Sex: 24-Sep-1961 (60 y.o. M) Treating RN: Carlene Coria Primary Care Jeniel Slauson: Halina Maidens Other Clinician: Referring Jahmil Macleod: Halina Maidens Treating Ryheem Jay/Extender: Skipper Cliche in Treatment: 6 Encounter Discharge Information Items Post Procedure Vitals Discharge Condition: Stable Temperature (F): 98.1 Ambulatory Status: Ambulatory Pulse (bpm): 61 Discharge Destination: Home Respiratory Rate (breaths/min): 18 Transportation: Private Auto Blood Pressure (mmHg): 110/68 Accompanied By: wife Schedule Follow-up Appointment: Yes Clinical Summary of Care: Patient Declined Electronic Signature(s) Signed: 08/05/2021 8:40:56 PM By: Carlene Coria RN Entered By: Carlene Coria on 07/30/2021 13:59:13 [name] ([id number]) -------------------------------------------------------------------------------- Lower Extremity Assessment Details Patient Name: [name] Date of Service: 07/30/2021 1:30 PM Medical Record Number: [id number] Patient Account Number: [id number] Date of Birth/Sex: 02-07-1961 (60 y.o. M) Treating RN: Carlene Coria Primary Care Jaamal Farooqui: Halina Maidens Other Clinician: Referring Danny Zimny: Halina Maidens Treating Earlisha Sharples/Extender: Jeri Cos Weeks in Treatment: 6 Edema Assessment Assessed: [Left: No] [Right: Yes] Edema: [Left: Ye] [Right: s] Vascular Assessment Pulses: Dorsalis Pedis Palpable: [Right:Yes] Electronic Signature(s) Signed: 08/05/2021 8:40:56 PM By: Carlene Coria RN Entered By: Carlene Coria on 07/30/2021 13:50:10 [name] ([id number]) -------------------------------------------------------------------------------- Multi Wound Chart Details Patient Name: [name] Date of Service: 07/30/2021 1:30 PM Medical Record Number: [id number] Patient Account Number: [id number] Date of Birth/Sex: 08-15-1961 (60 y.o. M) Treating RN: Carlene Coria Primary Care Waseem Suess: Halina Maidens Other Clinician: Referring Haydan Mansouri: Halina Maidens Treating Patrena Santalucia/Extender: Jeri Cos Weeks in Treatment: 6 Vital Signs Height(in): 66 Pulse(bpm): 51 Weight(lbs): 161 Blood Pressure(mmHg): 110/68 Body Mass Index(BMI): 26 Temperature(F): 98.1 Respiratory Rate(breaths/min): 18 Photos: [N/A:N/A] Wound Location: Right, Lateral Ankle N/A N/A Wounding Event: Trauma N/A N/A Primary Etiology: Trauma, Other N/A N/A Comorbid History: Hypertension N/A N/A Date Acquired: 05/09/2021 N/A N/A Weeks of Treatment: 6 N/A N/A Wound Status: Open N/A N/A Measurements L x W x D (cm) 1x1.5x0.5 N/A N/A Area (cm) : 1.178 N/A N/A Volume (cm) : 0.589 N/A  N/A % Reduction in Area: -50.10% N/A N/A % Reduction in Volume: -49.90% N/A N/A Classification: Full Thickness Without Exposed N/A N/A Support Structures Exudate Amount: Medium N/A N/A Exudate Type: Serosanguineous N/A N/A Exudate Color: red, brown N/A N/A Granulation Amount: Medium (34-66%) N/A N/A Granulation Quality: Red, Pink N/A N/A Necrotic Amount: Medium (34-66%) N/A N/A Exposed Structures: Fat Layer (Subcutaneous Tissue): N/A N/A Yes Fascia: No Tendon: No Muscle: No Joint: No Bone: No Epithelialization: None N/A N/A Treatment Notes Electronic Signature(s) Signed: 08/05/2021 8:40:56 PM By: Carlene Coria RN Entered By: Carlene Coria on 07/30/2021 13:50:30 [name] ([id number]) -------------------------------------------------------------------------------- Multi-Disciplinary Care Plan Details Patient Name: [name] Date of Service: 07/30/2021 1:30 PM Medical Record Number: [id number] Patient Account Number: [id number] Date of Birth/Sex: 1961/02/20 (60 y.o. M) Treating RN: Carlene Coria Primary Care Rayelynn Loyal: Halina Maidens Other Clinician: Referring Mick Tanguma: Halina Maidens Treating Hamdi Vari/Extender: Jeri Cos Weeks in Treatment: 6 Active Inactive Wound/Skin Impairment Nursing Diagnoses: Knowledge deficit related to ulceration/compromised skin integrity Goals: Patient/caregiver will  verbalize understanding of skin care regimen Date Initiated: 06/18/2021 Target Resolution Date: 08/18/2021 Goal Status: Active Ulcer/skin breakdown will have a volume reduction of 30% by week 4 Date Initiated: 06/18/2021 Date Inactivated: 07/16/2021 Target Resolution Date: 07/18/2021 Goal Status: Unmet Unmet Reason: comorbities Ulcer/skin breakdown will have a volume reduction of 50% by week 8 Date Initiated: 06/18/2021 Target Resolution Date: 08/18/2021 Goal Status: Active Ulcer/skin breakdown will have a volume reduction of 80% by week 12 Date Initiated: 06/18/2021 Target Resolution Date: 09/18/2021 Goal Status: Active Ulcer/skin breakdown will heal within 14 weeks Date Initiated: 06/18/2021 Target Resolution Date: 10/18/2021 Goal Status: Active Interventions: Assess patient/caregiver ability to obtain necessary supplies Assess patient/caregiver ability to perform ulcer/skin care regimen upon admission and as needed Assess ulceration(s) every visit Notes: Electronic Signature(s) Signed: 08/05/2021 8:40:56 PM By: Carlene Coria RN Entered By: Carlene Coria on 07/30/2021 13:50:23 [name] ([id number]) -------------------------------------------------------------------------------- Pain Assessment Details Patient Name: [name] Date of Service: 07/30/2021 1:30 PM Medical Record Number: [id number] Patient Account Number: [id number] Date of Birth/Sex: 04-Jul-1961 (60 y.o. M) Treating RN: Carlene Coria Primary Care Mikai Meints: Halina Maidens Other Clinician: Referring Atira Borello: Halina Maidens Treating Mayco Walrond/Extender: Jeri Cos Weeks in Treatment: 6 Active Problems Location of Pain Severity and Description of Pain Patient Has Paino No Site Locations Pain Management and Medication Current Pain Management: Electronic Signature(s) Signed: 08/05/2021 8:40:56 PM By: Carlene Coria RN Entered By: Carlene Coria on 07/30/2021 13:33:52 [name]  ([id number]) -------------------------------------------------------------------------------- Patient/Caregiver Education Details Patient Name: [name] Date of Service: 07/30/2021 1:30 PM Medical Record Number: [id number] Patient Account Number: [id number] Date of Birth/Gender: September 27, 1961 (60 y.o. M) Treating RN: Carlene Coria Primary Care Physician: Halina Maidens Other Clinician: Referring Physician: Halina Maidens Treating Physician/Extender: Skipper Cliche in Treatment: 6 Education Assessment Education Provided To: Patient Education Topics Provided Wound/Skin Impairment: Methods: Explain/Verbal Responses: State content correctly Electronic Signature(s) Signed: 08/05/2021 8:40:56 PM By: Carlene Coria RN Entered By: Carlene Coria on 07/30/2021 13:52:23 [name] ([id number]) -------------------------------------------------------------------------------- Wound Assessment Details Patient Name: [name] Date of Service: 07/30/2021 1:30 PM Medical Record Number: [id number] Patient Account Number: [id number] Date of Birth/Sex: [birth year] (60 y.o. M) Treating RN: Carlene Coria Primary Care Otto Felkins: Halina Maidens Other Clinician: Referring Jumanah Hynson: Halina Maidens Treating Ellyson Rarick/Extender: Jeri Cos Weeks in Treatment: 6 Wound Status Wound Number: 1 Primary Etiology: Trauma, Other Wound Location: Right, Lateral Ankle Wound Status: Open Wounding Event: Trauma Comorbid History: Hypertension Date Acquired: 05/09/2021 Weeks Of Treatment: 6 Clustered  Wound: No Photos Wound Measurements Length: (cm) 1 Width: (cm) 1.5 Depth: (cm) 0.5 Area: (cm) 1.178 Volume: (cm) 0.589 % Reduction in Area: -50.1% % Reduction in Volume: -49.9% Epithelialization: None Tunneling: No Undermining: No Wound Description Classification: Full Thickness Without Exposed Support Structures Exudate Amount: Medium Exudate Type: Serosanguineous Exudate Color: red,  brown Foul Odor After Cleansing: No Slough/Fibrino Yes Wound Bed Granulation Amount: Medium (34-66%) Exposed Structure Granulation Quality: Red, Pink Fascia Exposed: No Necrotic Amount: Medium (34-66%) Fat Layer (Subcutaneous Tissue) Exposed: Yes Necrotic Quality: Adherent Slough Tendon Exposed: No Muscle Exposed: No Joint Exposed: No Bone Exposed: No Treatment Notes Wound #1 (Ankle) Wound Laterality: Right, Lateral Cleanser Soap and Water Discharge Instruction: Gently cleanse wound with antibacterial soap, rinse and pat dry prior to dressing wounds Peri-Wound Care [name], [name] ([id number]) Topical Primary Dressing Prisma 4.34 (in) Discharge Instruction: Moisten w/normal saline or sterile water; Cover wound as directed. Do not remove from wound bed. Secondary Dressing Gauze Discharge Instruction: As directed: dry, moistened with saline or moistened with Dakins Solution coban Secured With Compression Wrap Compression Stockings Add-Ons Electronic Signature(s) Signed: 08/05/2021 8:40:56 PM By: Carlene Coria RN Entered By: Carlene Coria on 07/30/2021 13:37:13 [name] ([id number]) -------------------------------------------------------------------------------- [place] Details Patient Name: [name] Date of Service: 07/30/2021 1:30 PM Medical Record Number: [id number] Patient Account Number: [id number] Date of Birth/Sex: 05-06-1961 (60 y.o. M) Treating RN: Carlene Coria Primary Care Jyssica Rief: Halina Maidens Other Clinician: Referring Dovey Fatzinger: Halina Maidens Treating Starsha Morning/Extender: Skipper Cliche in Treatment: 6 Vital Signs Time Taken: 13:33 Temperature (F): 98.1 Height (in): 66 Pulse (bpm): 61 Weight (lbs): 161 Respiratory Rate (breaths/min): 18 Body Mass Index (BMI): 26 Blood Pressure (mmHg): 110/68 Reference Range: 80 - 120 mg / dl Electronic Signature(s) Signed: 08/05/2021 8:40:56 PM By: Carlene Coria RN Entered By: Carlene Coria on  07/30/2021 13:33:46

## 2021-08-06 ENCOUNTER — Other Ambulatory Visit: Payer: Self-pay

## 2021-08-06 ENCOUNTER — Encounter: Payer: Managed Care, Other (non HMO) | Admitting: Physician Assistant

## 2021-08-06 DIAGNOSIS — L97312 Non-pressure chronic ulcer of right ankle with fat layer exposed: Secondary | ICD-10-CM | POA: Diagnosis not present

## 2021-08-06 NOTE — Progress Notes (Addendum)
COURAGE, RUBY ([id number]) Visit Report for 08/06/2021 Chief Complaint Document Details Patient Name: [name], [name]. Date of Service: 08/06/2021 1:30 PM Medical Record Number: [id number] Patient Account Number: [id number] Date of Birth/Sex: 1961/10/21 (60 y.o. M) Treating RN: Carlene Coria Primary Care Provider: Halina Maidens Other Clinician: Referring Provider: Halina Maidens Treating Provider/Extender: Skipper Cliche in Treatment: 7 Information Obtained from: Patient Chief Complaint Right ankle laceration Electronic Signature(s) Signed: 08/06/2021 1:27:37 PM By: Worthy Keeler PA-C Entered By: Worthy Keeler on 08/06/2021 13:27:36 [name] ([id number]) -------------------------------------------------------------------------------- Debridement Details Patient Name: [name] Date of Service: 08/06/2021 1:30 PM Medical Record Number: [id number] Patient Account Number: [id number] Date of Birth/Sex: Apr 24, 1961 (60 y.o. M) Treating RN: Carlene Coria Primary Care Provider: Halina Maidens Other Clinician: Referring Provider: Halina Maidens Treating Provider/Extender: Skipper Cliche in Treatment: 7 Debridement Performed for Wound #1 Right,Lateral Ankle Assessment: Performed By: Physician Tommie Sams., PA-C Debridement Type: Debridement Level of Consciousness (Pre- Awake and Alert procedure): Pre-procedure Verification/Time Out Yes - 13:47 Taken: Start Time: 13:47 Pain Control: Lidocaine 4% Topical Solution Total Area Debrided (L x W): 1.6 (cm) x 1.7 (cm) = 2.72 (cm) Tissue and other material Viable, Non-Viable, Slough, Subcutaneous, Skin: Dermis , Skin: Epidermis, Slough debrided: Level: Skin/Subcutaneous Tissue Debridement Description: Excisional Instrument: Curette Bleeding: Minimum Hemostasis Achieved: Pressure End Time: 13:51 Procedural Pain: 0 Post Procedural Pain: 0 Response to Treatment: Procedure was tolerated well Level of  Consciousness (Post- Awake and Alert procedure): Post Debridement Measurements of Total Wound Length: (cm) 1.6 Width: (cm) 1.7 Depth: (cm) 0.5 Volume: (cm) 1.068 Character of Wound/Ulcer Post Debridement: Improved Post Procedure Diagnosis Same as Pre-procedure Electronic Signature(s) Signed: 08/06/2021 4:36:55 PM By: Carlene Coria RN Signed: 08/06/2021 5:54:54 PM By: Worthy Keeler PA-C Entered By: Carlene Coria on 08/06/2021 13:51:36 [name] ([id number]) -------------------------------------------------------------------------------- HPI Details Patient Name: [name] Date of Service: 08/06/2021 1:30 PM Medical Record Number: [id number] Patient Account Number: [id number] Date of Birth/Sex: Jun 30, 1961 (60 y.o. M) Treating RN: Carlene Coria Primary Care Provider: Halina Maidens Other Clinician: Referring Provider: Halina Maidens Treating Provider/Extender: Skipper Cliche in Treatment: 7 History of Present Illness HPI Description: 06/14/2021 upon evaluation today patient appears to be doing somewhat poorly in regard to the wound on his right ankle region. This was an injury that apparently occurred at work according to what he is telling me today. He is seen with his wife in the office at this point. Nonetheless the patient appears to have a circular opening with necrotic tissue in the base of the wound. This does not appear to be doing nearly as good as I would hope. Fortunately there does not appear to be any signs of infection though he does have a significant amount of necrotic tissue this is going to take some time to heal. He also has previously had surgery in this area there is a lot of scar tissue that will again complicate things and extended healing process. The patient does have hypertension otherwise no major medical problems. 06/25/2021 upon evaluation today patient presents for reevaluation here in our clinic he actually does seem to be doing better in  regard to his wound on the right lateral ankle. He has been tolerating the dressing changes without complication. Fortunately I do not see any signs of infection which is great news. 07/02/2021 upon evaluation today patient appears to be doing about the same in regard to his wound. With that being said I do believe that this is  cleaner there was a little bit of slough noted we have been using Iodoflex. I was able to see a little bit more granulation tissue starting to try to build up but I think the scar tissue here is going to be the biggest slowing factor with regard to healing. That was discussed with the patient and his wife today. Nonetheless I think we want to switch up things based on what I see at this point. 07/09/2021 upon evaluation today patient appears to be doing about the same in regard to his ankle ulcer. He has been tolerating the dressing changes without complication. Fortunately there does not appear to be any signs of active infection. No fevers, chills, nausea, vomiting, or diarrhea. 07/16/2021 upon evaluation today patient appears to be doing better to some degree in regard to his wound on ankle region. He actually showed some signs of new granulation growth which is actually a good thing. Fortunately there does not appear to be any evidence of active infection and wound did not appear to be too dry upon evaluation today which is also good news. No fevers, chills, nausea, vomiting, or diarrhea. 07/23/2021 upon evaluation today patient appears to be doing a little better in regard to his wound. He has been tolerating the dressing changes without complication. Fortunately there does not appear to be any signs of active infection which is great news. No fever chills noted we are still waiting on the results back from the TheraSkin investigation to see whether or not he qualifies for this treatment option. 07/30/2021 upon evaluation today patient appears to be doing a little better again  in regard to his wound. He is showing signs of improvement which is great news. I am very pleased with where things stand. I do believe that honestly all things considered he is making good progress but were just not able to do anything as far as advanced modalities to speed this up as insurance has literally devout denied everything including a snap VAC, Apligraf, PuraPly, and TheraSkin. Again I am not really certain what else to do otherwise other than just try to continue along the path we are as far as getting this wound to heal. 08/06/2021 upon evaluation today patient appears to be doing well currently he is of peering to make progress slowly but nonetheless this is at least making progress at this point. There does not appear to be any signs of active infection at this time. No fevers, chills, nausea, vomiting, or diarrhea. With that being said I do think that we are headed in the right direction with what I am seeing currently. I do wish we could do the snap VAC but to be honest I think this is the best we can do with the collagen as it stands currently. Electronic Signature(s) Signed: 08/06/2021 1:55:05 PM By: Worthy Keeler PA-C Previous Signature: 08/06/2021 1:36:45 PM Version By: Worthy Keeler PA-C Previous Signature: 08/06/2021 1:35:16 PM Version By: Worthy Keeler PA-C Entered By: Worthy Keeler on 08/06/2021 13:55:05 [name] ([id number]) -------------------------------------------------------------------------------- Physical Exam Details Patient Name: [name] Date of Service: 08/06/2021 1:30 PM Medical Record Number: [id number] Patient Account Number: [id number] Date of Birth/Sex: 01-13-1961 (60 y.o. M) Treating RN: Carlene Coria Primary Care Provider: Halina Maidens Other Clinician: Referring Provider: Halina Maidens Treating Provider/Extender: Jeri Cos Weeks in Treatment: 7 Constitutional Well-nourished and well-hydrated in no acute  distress. Respiratory normal breathing without difficulty. Psychiatric this patient is able to make decisions and demonstrates good  insight into disease process. Alert and Oriented x 3. pleasant and cooperative. Notes Upon inspection patient's wound did have some slough noted on the surface of the wound this did require sharp debridement today. I did perform debridement to clear away some of the necrotic debris he tolerated that without complication and postdebridement the wound bed appears to be doing better. Electronic Signature(s) Signed: 08/06/2021 1:56:41 PM By: Worthy Keeler PA-C Previous Signature: 08/06/2021 1:37:03 PM Version By: Worthy Keeler PA-C Previous Signature: 08/06/2021 1:35:34 PM Version By: Worthy Keeler PA-C Entered By: Worthy Keeler on 08/06/2021 13:56:41 [name] ([id number]) -------------------------------------------------------------------------------- Physician Orders Details Patient Name: [name] Date of Service: 08/06/2021 1:30 PM Medical Record Number: [id number] Patient Account Number: [id number] Date of Birth/Sex: January 10, 1961 (60 y.o. M) Treating RN: Carlene Coria Primary Care Provider: Halina Maidens Other Clinician: Referring Provider: Halina Maidens Treating Provider/Extender: Skipper Cliche in Treatment: 7 Verbal / Phone Orders: No Diagnosis Coding ICD-10 Coding Code Description [id number] Laceration without foreign body, right ankle, initial encounter L97.312 Non-pressure chronic ulcer of right ankle with fat layer exposed [place] (primary) hypertension Follow-up Appointments o Return Appointment in 1 week. Bathing/ Shower/ Hygiene o May shower; gently cleanse wound with antibacterial soap, rinse and pat dry prior to dressing wounds Edema Control - Lymphedema / Segmental Compressive Device / Other o Elevate, Exercise Daily and Avoid Standing for Long Periods of Time. o Elevate legs to the level of the  heart and pump ankles as often as possible o Elevate leg(s) parallel to the floor when sitting. Wound Treatment Wound #1 - Ankle Wound Laterality: Right, Lateral Cleanser: Soap and Water 3 x Per Week/30 Days Discharge Instructions: Gently cleanse wound with antibacterial soap, rinse and pat dry prior to dressing wounds Primary Dressing: Prisma 4.34 (in) (Generic) 3 x Per Week/30 Days Discharge Instructions: Moisten w/normal saline or sterile water; Cover wound as directed. Do not remove from wound bed. Secondary Dressing: Gauze 3 x Per Week/30 Days Discharge Instructions: As directed: dry, moistened with saline or moistened with Dakins Solution Secondary Dressing: coban 3 x Per Week/30 Days Electronic Signature(s) Signed: 08/06/2021 4:36:55 PM By: Carlene Coria RN Signed: 08/06/2021 5:54:54 PM By: Worthy Keeler PA-C Entered By: Carlene Coria on 08/06/2021 13:52:08 [name] ([id number]) -------------------------------------------------------------------------------- Problem List Details Patient Name: [name] Date of Service: 08/06/2021 1:30 PM Medical Record Number: [id number] Patient Account Number: [id number] Date of Birth/Sex: Jul 11, 1961 (60 y.o. M) Treating RN: Carlene Coria Primary Care Provider: Halina Maidens Other Clinician: Referring Provider: Halina Maidens Treating Provider/Extender: Skipper Cliche in Treatment: 7 Active Problems ICD-10 Encounter Code Description Active Date MDM Diagnosis S91.011A Laceration without foreign body, right ankle, initial encounter 06/18/2021 No Yes L97.312 Non-pressure chronic ulcer of right ankle with fat layer exposed 06/18/2021 No Yes I10 Essential (primary) hypertension 06/18/2021 No Yes Inactive Problems Resolved Problems Electronic Signature(s) Signed: 08/06/2021 1:27:30 PM By: Worthy Keeler PA-C Entered By: Worthy Keeler on 08/06/2021 13:27:30 [name]  ([id number]) -------------------------------------------------------------------------------- Progress Note Details Patient Name: [name] Date of Service: 08/06/2021 1:30 PM Medical Record Number: [id number] Patient Account Number: [id number] Date of Birth/Sex: 04/11/1961 (60 y.o. M) Treating RN: Carlene Coria Primary Care Provider: Halina Maidens Other Clinician: Referring Provider: Halina Maidens Treating Provider/Extender: Skipper Cliche in Treatment: 7 Subjective Chief Complaint Information obtained from Patient Right ankle laceration History of Present Illness (HPI) 06/14/2021 upon evaluation today patient appears to be doing somewhat poorly in regard to the  wound on his right ankle region. This was an injury that apparently occurred at work according to what he is telling me today. He is seen with his wife in the office at this point. Nonetheless the patient appears to have a circular opening with necrotic tissue in the base of the wound. This does not appear to be doing nearly as good as I would hope. Fortunately there does not appear to be any signs of infection though he does have a significant amount of necrotic tissue this is going to take some time to heal. He also has previously had surgery in this area there is a lot of scar tissue that will again complicate things and extended healing process. The patient does have hypertension otherwise no major medical problems. 06/25/2021 upon evaluation today patient presents for reevaluation here in our clinic he actually does seem to be doing better in regard to his wound on the right lateral ankle. He has been tolerating the dressing changes without complication. Fortunately I do not see any signs of infection which is great news. 07/02/2021 upon evaluation today patient appears to be doing about the same in regard to his wound. With that being said I do believe that this is cleaner there was a little bit of slough noted we have  been using Iodoflex. I was able to see a little bit more granulation tissue starting to try to build up but I think the scar tissue here is going to be the biggest slowing factor with regard to healing. That was discussed with the patient and his wife today. Nonetheless I think we want to switch up things based on what I see at this point. 07/09/2021 upon evaluation today patient appears to be doing about the same in regard to his ankle ulcer. He has been tolerating the dressing changes without complication. Fortunately there does not appear to be any signs of active infection. No fevers, chills, nausea, vomiting, or diarrhea. 07/16/2021 upon evaluation today patient appears to be doing better to some degree in regard to his wound on ankle region. He actually showed some signs of new granulation growth which is actually a good thing. Fortunately there does not appear to be any evidence of active infection and wound did not appear to be too dry upon evaluation today which is also good news. No fevers, chills, nausea, vomiting, or diarrhea. 07/23/2021 upon evaluation today patient appears to be doing a little better in regard to his wound. He has been tolerating the dressing changes without complication. Fortunately there does not appear to be any signs of active infection which is great news. No fever chills noted we are still waiting on the results back from the TheraSkin investigation to see whether or not he qualifies for this treatment option. 07/30/2021 upon evaluation today patient appears to be doing a little better again in regard to his wound. He is showing signs of improvement which is great news. I am very pleased with where things stand. I do believe that honestly all things considered he is making good progress but were just not able to do anything as far as advanced modalities to speed this up as insurance has literally devout denied everything including a snap VAC, Apligraf, PuraPly, and  TheraSkin. Again I am not really certain what else to do otherwise other than just try to continue along the path we are as far as getting this wound to heal. 08/06/2021 upon evaluation today patient appears to be doing well currently he  is of peering to make progress slowly but nonetheless this is at least making progress at this point. There does not appear to be any signs of active infection at this time. No fevers, chills, nausea, vomiting, or diarrhea. With that being said I do think that we are headed in the right direction with what I am seeing currently. I do wish we could do the snap VAC but to be honest I think this is the best we can do with the collagen as it stands currently. Objective Constitutional Well-nourished and well-hydrated in no acute distress. Vitals Time Taken: 1:34 PM, Height: 66 in, Weight: 161 lbs, BMI: 26, Temperature: 98.2 F, Pulse: 61 bpm, Respiratory Rate: 16 breaths/min, Blood Pressure: 131/69 mmHg. Respiratory normal breathing without difficulty. [name], [name] ([id number]) Psychiatric this patient is able to make decisions and demonstrates good insight into disease process. Alert and Oriented x 3. pleasant and cooperative. General Notes: Upon inspection patient's wound did have some slough noted on the surface of the wound this did require sharp debridement today. I did perform debridement to clear away some of the necrotic debris he tolerated that without complication and postdebridement the wound bed appears to be doing better. Integumentary (Hair, Skin) Wound #1 status is Open. Original cause of wound was Trauma. The date acquired was: 05/09/2021. The wound has been in treatment 7 weeks. The wound is located on the Right,Lateral Ankle. The wound measures 1.6cm length x 1.7cm width x 0.5cm depth; 2.136cm^2 area and 1.068cm^3 volume. There is Fat Layer (Subcutaneous Tissue) exposed. There is no tunneling or undermining noted. There is a medium amount of  serosanguineous drainage noted. There is medium (34-66%) red, pink granulation within the wound bed. There is a medium (34-66%) amount of necrotic tissue within the wound bed including Adherent Slough. Assessment Active Problems ICD-10 Laceration without foreign body, right ankle, initial encounter Non-pressure chronic ulcer of right ankle with fat layer exposed Essential (primary) hypertension Procedures Wound #1 Pre-procedure diagnosis of Wound #1 is a Trauma, Other located on the Right,Lateral Ankle . There was a Excisional Skin/Subcutaneous Tissue Debridement with a total area of 2.72 sq cm performed by Tommie Sams., PA-C. With the following instrument(s): Curette to remove Viable and Non-Viable tissue/material. Material removed includes Subcutaneous Tissue, Slough, Skin: Dermis, and Skin: Epidermis after achieving pain control using Lidocaine 4% Topical Solution. No specimens were taken. A time out was conducted at 13:47, prior to the start of the procedure. A Minimum amount of bleeding was controlled with Pressure. The procedure was tolerated well with a pain level of 0 throughout and a pain level of 0 following the procedure. Post Debridement Measurements: 1.6cm length x 1.7cm width x 0.5cm depth; 1.068cm^3 volume. Character of Wound/Ulcer Post Debridement is improved. Post procedure Diagnosis Wound #1: Same as Pre-Procedure Plan Follow-up Appointments: Return Appointment in 1 week. Bathing/ Shower/ Hygiene: May shower; gently cleanse wound with antibacterial soap, rinse and pat dry prior to dressing wounds Edema Control - Lymphedema / Segmental Compressive Device / Other: Elevate, Exercise Daily and Avoid Standing for Long Periods of Time. Elevate legs to the level of the heart and pump ankles as often as possible Elevate leg(s) parallel to the floor when sitting. WOUND #1: - Ankle Wound Laterality: Right, Lateral Cleanser: Soap and Water 3 x Per Week/30 Days Discharge  Instructions: Gently cleanse wound with antibacterial soap, rinse and pat dry prior to dressing wounds Primary Dressing: Prisma 4.34 (in) (Generic) 3 x Per Week/30 Days Discharge Instructions: Moisten w/normal  saline or sterile water; Cover wound as directed. Do not remove from wound bed. Secondary Dressing: Gauze 3 x Per Week/30 Days Discharge Instructions: As directed: dry, moistened with saline or moistened with Dakins Solution Secondary Dressing: coban 3 x Per Week/30 Days 1. Would recommend currently that we going continue with wound care measures as before and the patient is in agreement with plan we are using collagen followed by a piece of foam down and behind this. 2. Also can recommend that we continue with the roll gauze to cover which does seem to be doing very well and then they are securing this with [employer], [employer]. ([id number]) Coban. 3. Muscle can recommend the patient continue to monitor for any signs of worsening if anything changes he should contact the office and let me know. We will see patient back for reevaluation in 1 week here in the clinic. If anything worsens or changes patient will contact our office for additional recommendations. Electronic Signature(s) Signed: 08/06/2021 1:57:12 PM By: Worthy Keeler PA-C Previous Signature: 08/06/2021 1:36:09 PM Version By: Worthy Keeler PA-C Entered By: Worthy Keeler on 08/06/2021 13:57:12 [name] ([id number]) -------------------------------------------------------------------------------- SuperBill Details Patient Name: [name] Date of Service: 08/06/2021 Medical Record Number: [id number] Patient Account Number: [id number] Date of Birth/Sex: January 16, 1961 (60 y.o. M) Treating RN: Carlene Coria Primary Care Provider: Halina Maidens Other Clinician: Referring Provider: Halina Maidens Treating Provider/Extender: Jeri Cos Weeks in Treatment: 7 Diagnosis Coding ICD-10 Codes Code  Description [phone] Laceration without foreign body, right ankle, initial encounter [id number] Non-pressure chronic ulcer of right ankle with fat layer exposed [place] (primary) hypertension Facility Procedures CPT4 Code: [id number] Description: [id number] - DEB SUBQ TISSUE 20 SQ CM/< Modifier: Quantity: 1 CPT4 Code: Description: ICD-10 Diagnosis Description [id number] Non-pressure chronic ulcer of right ankle with fat layer exposed S91.011A Laceration without foreign body, right ankle, initial encounter Modifier: Quantity: Physician Procedures CPT4 [id number] Description: 11042 - WC PHYS SUBQ TISS 20 SQ CM Modifier: Quantity: 1 CPT4 Code: Description: ICD-10 Diagnosis Description [id number] Non-pressure chronic ulcer of right ankle with fat layer exposed S91.011A Laceration without foreign body, right ankle, initial encounter Modifier: Quantity: Electronic Signature(s) Signed: 08/06/2021 1:57:27 PM By: Worthy Keeler PA-C Entered By: Worthy Keeler on 08/06/2021 13:57:27

## 2021-08-06 NOTE — Progress Notes (Addendum)
HALLIS, MCADORY ([id number]) Visit Report for 08/06/2021 Arrival Information Details Patient Name: [name], [name] Date of Service: 08/06/2021 1:30 PM Medical Record Number: [id number] Patient Account Number: [id number] Date of Birth/Sex: 1961-10-18 (60 y.o. M) Treating RN: Carlene Coria Primary Care Kord Monette: Halina Maidens Other Clinician: Referring Endre Coutts: Halina Maidens Treating Javia Dillow/Extender: Skipper Cliche in Treatment: 7 Visit Information History Since Last Visit All ordered tests and consults were completed: No Patient Arrived: Ambulatory Added or deleted any medications: No Arrival Time: 13:27 Any new allergies or adverse reactions: No Accompanied By: wife Had a fall or experienced change in No Transfer Assistance: None activities of daily living that may affect Patient Identification Verified: Yes risk of falls: Secondary Verification Process Completed: Yes Signs or symptoms of abuse/neglect since last visito No Patient Requires Transmission-Based Precautions: No Hospitalized since last visit: No Patient Has Alerts: Yes Implantable device outside of the clinic excluding No Patient Alerts: NOT diabetic cellular tissue based products placed in the center since last visit: Has Dressing in Place as Prescribed: Yes Pain Present Now: No Electronic Signature(s) Signed: 08/06/2021 4:36:55 PM By: Carlene Coria RN Entered By: Carlene Coria on 08/06/2021 13:34:11 [name] ([id number]) -------------------------------------------------------------------------------- Clinic Level of Care Assessment Details Patient Name: [name] Date of Service: 08/06/2021 1:30 PM Medical Record Number: [id number] Patient Account Number: [id number] Date of Birth/Sex: 23-May-1961 (60 y.o. M) Treating RN: Carlene Coria Primary Care Shashwat Cleary: Halina Maidens Other Clinician: Referring Aurianna Earlywine: Halina Maidens Treating Tiajuana Leppanen/Extender: Skipper Cliche in Treatment:  7 Clinic Level of Care Assessment Items TOOL 1 Quantity Score '[]'$  - Use when EandM and Procedure is performed on INITIAL visit 0 ASSESSMENTS - Nursing Assessment / Reassessment '[]'$  - General Physical Exam (combine w/ comprehensive assessment (listed just below) when performed on new 0 pt. evals) '[]'$  - 0 Comprehensive Assessment (HX, ROS, Risk Assessments, Wounds Hx, etc.) ASSESSMENTS - Wound and Skin Assessment / Reassessment '[]'$  - Dermatologic / Skin Assessment (not related to wound area) 0 ASSESSMENTS - Ostomy and/or Continence Assessment and Care '[]'$  - Incontinence Assessment and Management 0 '[]'$  - 0 Ostomy Care Assessment and Management (repouching, etc.) PROCESS - Coordination of Care '[]'$  - Simple Patient / Family Education for ongoing care 0 '[]'$  - 0 Complex (extensive) Patient / Family Education for ongoing care '[]'$  - 0 Staff obtains Programmer, systems, Records, Test Results / Process Orders '[]'$  - 0 Staff telephones HHA, Nursing Homes / Clarify orders / etc '[]'$  - 0 Routine Transfer to another Facility (non-emergent condition) '[]'$  - 0 Routine Hospital Admission (non-emergent condition) '[]'$  - 0 New Admissions / Biomedical engineer / Ordering NPWT, Apligraf, etc. '[]'$  - 0 Emergency Hospital Admission (emergent condition) PROCESS - Special Needs '[]'$  - Pediatric / Minor Patient Management 0 '[]'$  - 0 Isolation Patient Management '[]'$  - 0 Hearing / Language / Visual special needs '[]'$  - 0 Assessment of Community assistance (transportation, D/C planning, etc.) '[]'$  - 0 Additional assistance / Altered mentation '[]'$  - 0 Support Surface(s) Assessment (bed, cushion, seat, etc.) INTERVENTIONS - Miscellaneous '[]'$  - External ear exam 0 '[]'$  - 0 Patient Transfer (multiple staff / Civil Service fast streamer / Similar devices) '[]'$  - 0 Simple Staple / Suture removal (25 or less) '[]'$  - 0 Complex Staple / Suture removal (26 or more) '[]'$  - 0 Hypo/Hyperglycemic Management (do not check if billed separately) '[]'$  - 0 Ankle /  Brachial Index (ABI) - do not check if billed separately Has the patient been seen at the hospital within the last three years: Yes  Total Score: 0 Level Of Care: ____ [name] ([id number]) Electronic Signature(s) Signed: 08/06/2021 4:36:55 PM By: Carlene Coria RN Entered By: Carlene Coria on 08/06/2021 13:52:15 [name] ([id number]) -------------------------------------------------------------------------------- Encounter Discharge Information Details Patient Name: [name] Date of Service: 08/06/2021 1:30 PM Medical Record Number: [id number] Patient Account Number: [id number] Date of Birth/Sex: Jul 23, 1961 (60 y.o. M) Treating RN: Carlene Coria Primary Care Kyia Rhude: Halina Maidens Other Clinician: Referring Iver Miklas: Halina Maidens Treating Donnel Venuto/Extender: Skipper Cliche in Treatment: 7 Encounter Discharge Information Items Post Procedure Vitals Discharge Condition: Stable Temperature (F): 98.2 Ambulatory Status: Ambulatory Pulse (bpm): 61 Discharge Destination: Home Respiratory Rate (breaths/min): 16 Transportation: Private Auto Blood Pressure (mmHg): 131/69 Accompanied By: wife Schedule Follow-up Appointment: Yes Clinical Summary of Care: Patient Declined Electronic Signature(s) Signed: 08/06/2021 4:36:55 PM By: Carlene Coria RN Entered By: Carlene Coria on 08/06/2021 13:53:39 [name] ([id number]) -------------------------------------------------------------------------------- Lower Extremity Assessment Details Patient Name: [name] Date of Service: 08/06/2021 1:30 PM Medical Record Number: [id number] Patient Account Number: [id number] Date of Birth/Sex: 1961/05/31 (60 y.o. M) Treating RN: Carlene Coria Primary Care Darvell Monteforte: Halina Maidens Other Clinician: Referring Venus Ruhe: Halina Maidens Treating Anne Sebring/Extender: Jeri Cos Weeks in Treatment: 7 Edema Assessment Assessed: [Left: No] [Right: No] Edema: [Left: Ye]  [Right: s] Vascular Assessment Pulses: Dorsalis Pedis Palpable: [Right:Yes] Electronic Signature(s) Signed: 08/06/2021 4:36:55 PM By: Carlene Coria RN Entered By: Carlene Coria on 08/06/2021 13:40:47 [name] ([id number]) -------------------------------------------------------------------------------- Multi Wound Chart Details Patient Name: [name] Date of Service: 08/06/2021 1:30 PM Medical Record Number: [id number] Patient Account Number: [id number] Date of Birth/Sex: 09-07-1961 (60 y.o. M) Treating RN: Carlene Coria Primary Care Kirsi Hugh: Halina Maidens Other Clinician: Referring Amandeep Hogston: Halina Maidens Treating Emillio Ngo/Extender: Skipper Cliche in Treatment: 7 Vital Signs Height(in): 66 Pulse(bpm): 61 Weight(lbs): 161 Blood Pressure(mmHg): 131/69 Body Mass Index(BMI): 26 Temperature(F): 98.2 Respiratory Rate(breaths/min): 16 Photos: [N/A:N/A] Wound Location: Right, Lateral Ankle N/A N/A Wounding Event: Trauma N/A N/A Primary Etiology: Trauma, Other N/A N/A Comorbid History: Hypertension N/A N/A Date Acquired: 05/09/2021 N/A N/A Weeks of Treatment: 7 N/A N/A Wound Status: Open N/A N/A Measurements L x W x D (cm) 1.6x1.7x0.5 N/A N/A Area (cm) : 2.136 N/A N/A Volume (cm) : 1.068 N/A N/A % Reduction in Area: -172.10% N/A N/A % Reduction in Volume: -171.80% N/A N/A Classification: Full Thickness Without Exposed N/A N/A Support Structures Exudate Amount: Medium N/A N/A Exudate Type: Serosanguineous N/A N/A Exudate Color: red, brown N/A N/A Granulation Amount: Medium (34-66%) N/A N/A Granulation Quality: Red, Pink N/A N/A Necrotic Amount: Medium (34-66%) N/A N/A Exposed Structures: Fat Layer (Subcutaneous Tissue): N/A N/A Yes Fascia: No Tendon: No Muscle: No Joint: No Bone: No Epithelialization: None N/A N/A Treatment Notes Electronic Signature(s) Signed: 08/06/2021 4:36:55 PM By: Carlene Coria RN Entered By: Carlene Coria on 08/06/2021  13:48:19 [name] ([id number]) -------------------------------------------------------------------------------- Multi-Disciplinary Care Plan Details Patient Name: [name] Date of Service: 08/06/2021 1:30 PM Medical Record Number: [id number] Patient Account Number: [id number] Date of Birth/Sex: 03-Aug-1961 (60 y.o. M) Treating RN: Carlene Coria Primary Care Jaisha Villacres: Halina Maidens Other Clinician: Referring Neisha Hinger: Halina Maidens Treating Jeslie Lowe/Extender: Skipper Cliche in Treatment: 7 Active Inactive Wound/Skin Impairment Nursing Diagnoses: Knowledge deficit related to ulceration/compromised skin integrity Goals: Patient/caregiver will verbalize understanding of skin care regimen Date Initiated: 06/18/2021 Target Resolution Date: 08/18/2021 Goal Status: Active Ulcer/skin breakdown will have a volume reduction of 30% by week 4 Date Initiated: 06/18/2021 Date Inactivated: 07/16/2021 Target Resolution Date: 07/18/2021 Goal Status: Unmet  Unmet Reason: comorbities Ulcer/skin breakdown will have a volume reduction of 50% by week 8 Date Initiated: 06/18/2021 Target Resolution Date: 08/18/2021 Goal Status: Active Ulcer/skin breakdown will have a volume reduction of 80% by week 12 Date Initiated: 06/18/2021 Target Resolution Date: 09/18/2021 Goal Status: Active Ulcer/skin breakdown will heal within 14 weeks Date Initiated: 06/18/2021 Target Resolution Date: 10/18/2021 Goal Status: Active Interventions: Assess patient/caregiver ability to obtain necessary supplies Assess patient/caregiver ability to perform ulcer/skin care regimen upon admission and as needed Assess ulceration(s) every visit Notes: Electronic Signature(s) Signed: 08/06/2021 4:36:55 PM By: Carlene Coria RN Entered By: Carlene Coria on 08/06/2021 [place], [place]. ([id number]) -------------------------------------------------------------------------------- Pain Assessment Details Patient  Name: [name] Date of Service: 08/06/2021 1:30 PM Medical Record Number: [id number] Patient Account Number: [id number] Date of Birth/Sex: 11-23-1961 (60 y.o. M) Treating RN: Carlene Coria Primary Care Derenda Giddings: Halina Maidens Other Clinician: Referring Taleya Whitcher: Halina Maidens Treating Quindarius Cabello/Extender: Skipper Cliche in Treatment: 7 Active Problems Location of Pain Severity and Description of Pain Patient Has Paino Yes Site Locations With Dressing Change: Yes Duration of the Pain. Constant / Intermittento Intermittent How Long Does it Lasto Hours: Minutes: 15 Rate the pain. Current Pain Level: 4 Worst Pain Level: 7 Least Pain Level: 0 Tolerable Pain Level: 5 Character of Pain Describe the Pain: Aching, Burning Pain Management and Medication Current Pain Management: Medication: Yes Cold Application: No Rest: Yes Massage: No Activity: No T.E.N.S.: No Heat Application: No Leg drop or elevation: No Is the Current Pain Management Adequate: Inadequate How does your wound impact your activities of daily livingo Sleep: Yes Bathing: No Appetite: Yes Relationship With Others: No Bladder Continence: No Emotions: No Bowel Continence: No Work: No Toileting: No Drive: No Dressing: No Hobbies: No Electronic Signature(s) Signed: 08/06/2021 4:36:55 PM By: Carlene Coria RN Entered By: Carlene Coria on 08/06/2021 13:37:03 [name] ([id number]) -------------------------------------------------------------------------------- Patient/Caregiver Education Details Patient Name: [name] Date of Service: 08/06/2021 1:30 PM Medical Record Number: [id number] Patient Account Number: [id number] Date of Birth/Gender: [birth year] (60 y.o. M) Treating RN: Carlene Coria Primary Care Physician: Halina Maidens Other Clinician: Referring Physician: Halina Maidens Treating Physician/Extender: Skipper Cliche in Treatment: 7 Education Assessment Education  Provided To: Patient Education Topics Provided Wound/Skin Impairment: Methods: Explain/Verbal Responses: State content correctly Electronic Signature(s) Signed: 08/06/2021 4:36:55 PM By: Carlene Coria RN Entered By: Carlene Coria on 08/06/2021 13:52:30 [name] ([id number]) -------------------------------------------------------------------------------- Wound Assessment Details Patient Name: [name] Date of Service: 08/06/2021 1:30 PM Medical Record Number: [id number] Patient Account Number: [id number] Date of Birth/Sex: 04/06/1961 (60 y.o. M) Treating RN: Carlene Coria Primary Care Jetty Berland: Halina Maidens Other Clinician: Referring Jakirah Zaun: Halina Maidens Treating Abdirahim Flavell/Extender: Jeri Cos Weeks in Treatment: 7 Wound Status Wound Number: 1 Primary Etiology: Trauma, Other Wound Location: Right, Lateral Ankle Wound Status: Open Wounding Event: Trauma Comorbid History: Hypertension Date Acquired: 05/09/2021 Weeks Of Treatment: 7 Clustered Wound: No Photos Wound Measurements Length: (cm) 1.6 Width: (cm) 1.7 Depth: (cm) 0.5 Area: (cm) 2.136 Volume: (cm) 1.068 % Reduction in Area: -172.1% % Reduction in Volume: -171.8% Epithelialization: None Tunneling: No Undermining: No Wound Description Classification: Full Thickness Without Exposed Support Structures Exudate Amount: Medium Exudate Type: Serosanguineous Exudate Color: red, brown Foul Odor After Cleansing: No Slough/Fibrino Yes Wound Bed Granulation Amount: Medium (34-66%) Exposed Structure Granulation Quality: Red, Pink Fascia Exposed: No Necrotic Amount: Medium (34-66%) Fat Layer (Subcutaneous Tissue) Exposed: Yes Necrotic Quality: Adherent Slough Tendon Exposed: No Muscle Exposed: No Joint Exposed: No Bone Exposed:  No Treatment Notes Wound #1 (Ankle) Wound Laterality: Right, Lateral Cleanser Soap and Water Discharge Instruction: Gently cleanse wound with antibacterial soap, rinse  and pat dry prior to dressing wounds [place], Bevan G. ([id number]) Topical Primary Dressing Prisma 4.34 (in) Discharge Instruction: Moisten w/normal saline or sterile water; Cover wound as directed. Do not remove from wound bed. Secondary Dressing Gauze Discharge Instruction: As directed: dry, moistened with saline or moistened with Dakins Solution coban Secured With Compression Wrap Compression Stockings Add-Ons Electronic Signature(s) Signed: 08/06/2021 4:36:55 PM By: Carlene Coria RN Entered By: Carlene Coria on 08/06/2021 13:40:23 [name] ([id number]) -------------------------------------------------------------------------------- [place] Details Patient Name: [name] Date of Service: 08/06/2021 1:30 PM Medical Record Number: [id number] Patient Account Number: [id number] Date of Birth/Sex: Dec 05, 1961 (60 y.o. M) Treating RN: Carlene Coria Primary Care Lejend Dalby: Halina Maidens Other Clinician: Referring Glendia Olshefski: Halina Maidens Treating Sedrick Tober/Extender: Skipper Cliche in Treatment: 7 Vital Signs Time Taken: 13:34 Temperature (F): 98.2 Height (in): 66 Pulse (bpm): 61 Weight (lbs): 161 Respiratory Rate (breaths/min): 16 Body Mass Index (BMI): 26 Blood Pressure (mmHg): 131/69 Reference Range: 80 - 120 mg / dl Electronic Signature(s) Signed: 08/06/2021 4:36:55 PM By: Carlene Coria RN Entered By: Carlene Coria on 08/06/2021 13:35:40

## 2021-08-13 ENCOUNTER — Encounter: Payer: Managed Care, Other (non HMO) | Admitting: Physician Assistant

## 2021-08-13 ENCOUNTER — Other Ambulatory Visit: Payer: Self-pay | Admitting: Physician Assistant

## 2021-08-13 ENCOUNTER — Other Ambulatory Visit: Payer: Self-pay

## 2021-08-13 DIAGNOSIS — L97312 Non-pressure chronic ulcer of right ankle with fat layer exposed: Secondary | ICD-10-CM | POA: Diagnosis not present

## 2021-08-13 DIAGNOSIS — S81801A Unspecified open wound, right lower leg, initial encounter: Secondary | ICD-10-CM

## 2021-08-13 NOTE — Progress Notes (Addendum)
GIANNO, RONDAN ([id number]) Visit Report for 08/13/2021 Chief Complaint Document Details Patient Name: [name], RUDDEN Date of Service: 08/13/2021 3:00 PM Medical Record Number: [id number] Patient Account Number: [id number] Date of Birth/Sex: July 16, 1961 (60 y.o. M) Treating RN: Carlene Coria Primary Care Provider: Halina Maidens Other Clinician: Referring Provider: Halina Maidens Treating Provider/Extender: Skipper Cliche in Treatment: 8 Information Obtained from: Patient Chief Complaint Right ankle laceration Electronic Signature(s) Signed: 08/13/2021 2:41:35 PM By: Worthy Keeler PA-C Entered By: Worthy Keeler on 08/13/2021 14:41:34 [name] ([id number]) -------------------------------------------------------------------------------- Debridement Details Patient Name: [name] Date of Service: 08/13/2021 3:00 PM Medical Record Number: [id number] Patient Account Number: [id number] Date of Birth/Sex: 03-20-1961 (60 y.o. M) Treating RN: Carlene Coria Primary Care Provider: Halina Maidens Other Clinician: Referring Provider: Halina Maidens Treating Provider/Extender: Skipper Cliche in Treatment: 8 Debridement Performed for Wound #1 Right,Lateral Ankle Assessment: Performed By: Physician Tommie Sams., PA-C Debridement Type: Debridement Level of Consciousness (Pre- Awake and Alert procedure): Pre-procedure Verification/Time Out Yes - 15:33 Taken: Start Time: 15:33 Pain Control: Lidocaine 4% Topical Solution Total Area Debrided (L x W): 1.1 (cm) x 1.9 (cm) = 2.09 (cm) Tissue and other material Viable, Non-Viable, Slough, Subcutaneous, Skin: Dermis , Skin: Epidermis, Slough debrided: Level: Skin/Subcutaneous Tissue Debridement Description: Excisional Instrument: Curette Bleeding: Minimum Hemostasis Achieved: Pressure End Time: 15:40 Procedural Pain: 0 Post Procedural Pain: 0 Response to Treatment: Procedure was tolerated well Level of  Consciousness (Post- Awake and Alert procedure): Post Debridement Measurements of Total Wound Length: (cm) 1.1 Width: (cm) 1.9 Depth: (cm) 0.5 Volume: (cm) 0.821 Character of Wound/Ulcer Post Debridement: Improved Post Procedure Diagnosis Same as Pre-procedure Electronic Signature(s) Signed: 08/13/2021 5:46:21 PM By: Worthy Keeler PA-C Signed: 08/18/2021 9:51:49 AM By: Carlene Coria RN Entered By: Carlene Coria on 08/13/2021 15:34:18 [name] ([id number]) -------------------------------------------------------------------------------- HPI Details Patient Name: [name] Date of Service: 08/13/2021 3:00 PM Medical Record Number: [id number] Patient Account Number: [id number] Date of Birth/Sex: [birth year] (60 y.o. M) Treating RN: Carlene Coria Primary Care Provider: Halina Maidens Other Clinician: Referring Provider: Halina Maidens Treating Provider/Extender: Skipper Cliche in Treatment: 8 History of Present Illness HPI Description: 06/14/2021 upon evaluation today patient appears to be doing somewhat poorly in regard to the wound on his right ankle region. This was an injury that apparently occurred at work according to what he is telling me today. He is seen with his wife in the office at this point. Nonetheless the patient appears to have a circular opening with necrotic tissue in the base of the wound. This does not appear to be doing nearly as good as I would hope. Fortunately there does not appear to be any signs of infection though he does have a significant amount of necrotic tissue this is going to take some time to heal. He also has previously had surgery in this area there is a lot of scar tissue that will again complicate things and extended healing process. The patient does have hypertension otherwise no major medical problems. 06/25/2021 upon evaluation today patient presents for reevaluation here in our clinic he actually does seem to be doing better in  regard to his wound on the right lateral ankle. He has been tolerating the dressing changes without complication. Fortunately I do not see any signs of infection which is great news. 07/02/2021 upon evaluation today patient appears to be doing about the same in regard to his wound. With that being said I do believe that this is  cleaner there was a little bit of slough noted we have been using Iodoflex. I was able to see a little bit more granulation tissue starting to try to build up but I think the scar tissue here is going to be the biggest slowing factor with regard to healing. That was discussed with the patient and his wife today. Nonetheless I think we want to switch up things based on what I see at this point. 07/09/2021 upon evaluation today patient appears to be doing about the same in regard to his ankle ulcer. He has been tolerating the dressing changes without complication. Fortunately there does not appear to be any signs of active infection. No fevers, chills, nausea, vomiting, or diarrhea. 07/16/2021 upon evaluation today patient appears to be doing better to some degree in regard to his wound on ankle region. He actually showed some signs of new granulation growth which is actually a good thing. Fortunately there does not appear to be any evidence of active infection and wound did not appear to be too dry upon evaluation today which is also good news. No fevers, chills, nausea, vomiting, or diarrhea. 07/23/2021 upon evaluation today patient appears to be doing a little better in regard to his wound. He has been tolerating the dressing changes without complication. Fortunately there does not appear to be any signs of active infection which is great news. No fever chills noted we are still waiting on the results back from the TheraSkin investigation to see whether or not he qualifies for this treatment option. 07/30/2021 upon evaluation today patient appears to be doing a little better again  in regard to his wound. He is showing signs of improvement which is great news. I am very pleased with where things stand. I do believe that honestly all things considered he is making good progress but were just not able to do anything as far as advanced modalities to speed this up as insurance has literally devout denied everything including a snap VAC, Apligraf, PuraPly, and TheraSkin. Again I am not really certain what else to do otherwise other than just try to continue along the path we are as far as getting this wound to heal. 08/06/2021 upon evaluation today patient appears to be doing well currently he is of peering to make progress slowly but nonetheless this is at least making progress at this point. There does not appear to be any signs of active infection at this time. No fevers, chills, nausea, vomiting, or diarrhea. With that being said I do think that we are headed in the right direction with what I am seeing currently. I do wish we could do the snap VAC but to be honest I think this is the best we can do with the collagen as it stands currently. 08/13/2021 upon evaluation today patient unfortunately appears to be doing a little bit more poorly in regard to the fact that he is having some pain on the side of his ankle. This does have me a little bit concerned this is on the edge of the wound. There is a very hard area under this that I presume to be more of a calcification although I cannot be certain this is not just a superficial bony protrusion. I do feel it in the base of the wound but again I assumed more calcification due to inflammation from prior surgery but again that may not actually be the case. I do think that we probably need to go forward with an x-ray at  this point to see if there is anything going on that could be a more significant issue here. Depending on the results of the x-ray we may be looking at an MRI as well. Electronic Signature(s) Signed: 08/13/2021 5:26:30 PM  By: Worthy Keeler PA-C Entered By: Worthy Keeler on 08/13/2021 17:26:30 [name] ([id number]) -------------------------------------------------------------------------------- Physical Exam Details Patient Name: [name] Date of Service: 08/13/2021 3:00 PM Medical Record Number: [id number] Patient Account Number: [id number] Date of Birth/Sex: 07-16-1961 (60 y.o. M) Treating RN: Carlene Coria Primary Care Provider: Halina Maidens Other Clinician: Referring Provider: Halina Maidens Treating Provider/Extender: Jeri Cos Weeks in Treatment: 8 Constitutional Well-nourished and well-hydrated in no acute distress. Respiratory normal breathing without difficulty. Psychiatric this patient is able to make decisions and demonstrates good insight into disease process. Alert and Oriented x 3. pleasant and cooperative. Notes Upon inspection patient's wound bed actually showed signs of good granulation epithelization in a lot of areas and I am actually fairly pleased for the most part. With that being said unfortunately he does have some region here where he has if not bone at least a calcification which is what I thought before up underneath the skin. Nonetheless I am beginning to wonder if this does not actually track down to bone. So much of the wound looks good but he is having pain right at this location that makes me more concerned. Electronic Signature(s) Signed: 08/13/2021 5:27:10 PM By: Worthy Keeler PA-C Entered By: Worthy Keeler on 08/13/2021 17:27:10 [name] ([id number]) -------------------------------------------------------------------------------- Physician Orders Details Patient Name: [name] Date of Service: 08/13/2021 3:00 PM Medical Record Number: [id number] Patient Account Number: [id number] Date of Birth/Sex: 08-17-1961 (60 y.o. M) Treating RN: Carlene Coria Primary Care Provider: Halina Maidens Other Clinician: Referring Provider:  Halina Maidens Treating Provider/Extender: Skipper Cliche in Treatment: 8 Verbal / Phone Orders: No Diagnosis Coding ICD-10 Coding Code Description [id number] Laceration without foreign body, right ankle, initial encounter L97.312 Non-pressure chronic ulcer of right ankle with fat layer exposed [place] (primary) hypertension Follow-up Appointments o Return Appointment in 1 week. Bathing/ Shower/ Hygiene o May shower; gently cleanse wound with antibacterial soap, rinse and pat dry prior to dressing wounds Edema Control - Lymphedema / Segmental Compressive Device / Other o Elevate, Exercise Daily and Avoid Standing for Long Periods of Time. o Elevate legs to the level of the heart and pump ankles as often as possible o Elevate leg(s) parallel to the floor when sitting. Wound Treatment Wound #1 - Ankle Wound Laterality: Right, Lateral Cleanser: Soap and Water 3 x Per Week/30 Days Discharge Instructions: Gently cleanse wound with antibacterial soap, rinse and pat dry prior to dressing wounds Primary Dressing: Prisma 4.34 (in) (Generic) 3 x Per Week/30 Days Discharge Instructions: Moisten w/normal saline or sterile water; Cover wound as directed. Do not remove from wound bed. Secondary Dressing: Gauze 3 x Per Week/30 Days Discharge Instructions: As directed: dry, moistened with saline or moistened with Dakins Solution Secondary Dressing: coban 3 x Per Week/30 Days Radiology o X-ray, ankle right - non healing wound , increased pain, area swollen - (ICD10 L97.312 - Non-pressure chronic ulcer of right ankle with fat layer exposed) Electronic Signature(s) Signed: 08/13/2021 5:46:21 PM By: Worthy Keeler PA-C Signed: 08/18/2021 9:51:49 AM By: Carlene Coria RN Entered By: Carlene Coria on 08/13/2021 15:36:25 [name] ([id number]) -------------------------------------------------------------------------------- Problem List Details Patient Name: [name] Date of Service: 08/13/2021 3:00 PM Medical Record Number: [id number]  Patient Account Number: [id number] Date of Birth/Sex: May 07, 1961 (59 y.o. M) Treating RN: Carlene Coria Primary Care Provider: Halina Maidens Other Clinician: Referring Provider: Halina Maidens Treating Provider/Extender: Skipper Cliche in Treatment: 8 Active Problems ICD-10 Encounter Code Description Active Date MDM Diagnosis S91.011A Laceration without foreign body, right ankle, initial encounter 06/18/2021 No Yes L97.312 Non-pressure chronic ulcer of right ankle with fat layer exposed 06/18/2021 No Yes I10 Essential (primary) hypertension 06/18/2021 No Yes Inactive Problems Resolved Problems Electronic Signature(s) Signed: 08/13/2021 2:41:29 PM By: Worthy Keeler PA-C Entered By: Worthy Keeler on 08/13/2021 14:41:28 [name] ([id number]) -------------------------------------------------------------------------------- Progress Note Details Patient Name: [name] Date of Service: 08/13/2021 3:00 PM Medical Record Number: [id number] Patient Account Number: [id number] Date of Birth/Sex: 1961/06/06 (60 y.o. M) Treating RN: Carlene Coria Primary Care Provider: Halina Maidens Other Clinician: Referring Provider: Halina Maidens Treating Provider/Extender: Skipper Cliche in Treatment: 8 Subjective Chief Complaint Information obtained from Patient Right ankle laceration History of Present Illness (HPI) 06/14/2021 upon evaluation today patient appears to be doing somewhat poorly in regard to the wound on his right ankle region. This was an injury that apparently occurred at work according to what he is telling me today. He is seen with his wife in the office at this point. Nonetheless the patient appears to have a circular opening with necrotic tissue in the base of the wound. This does not appear to be doing nearly as good as I would hope. Fortunately there does not appear to be any signs of  infection though he does have a significant amount of necrotic tissue this is going to take some time to heal. He also has previously had surgery in this area there is a lot of scar tissue that will again complicate things and extended healing process. The patient does have hypertension otherwise no major medical problems. 06/25/2021 upon evaluation today patient presents for reevaluation here in our clinic he actually does seem to be doing better in regard to his wound on the right lateral ankle. He has been tolerating the dressing changes without complication. Fortunately I do not see any signs of infection which is great news. 07/02/2021 upon evaluation today patient appears to be doing about the same in regard to his wound. With that being said I do believe that this is cleaner there was a little bit of slough noted we have been using Iodoflex. I was able to see a little bit more granulation tissue starting to try to build up but I think the scar tissue here is going to be the biggest slowing factor with regard to healing. That was discussed with the patient and his wife today. Nonetheless I think we want to switch up things based on what I see at this point. 07/09/2021 upon evaluation today patient appears to be doing about the same in regard to his ankle ulcer. He has been tolerating the dressing changes without complication. Fortunately there does not appear to be any signs of active infection. No fevers, chills, nausea, vomiting, or diarrhea. 07/16/2021 upon evaluation today patient appears to be doing better to some degree in regard to his wound on ankle region. He actually showed some signs of new granulation growth which is actually a good thing. Fortunately there does not appear to be any evidence of active infection and wound did not appear to be too dry upon evaluation today which is also good news. No fevers, chills, nausea, vomiting, or diarrhea. 07/23/2021 upon evaluation today patient  appears to be doing a little better in regard to his wound. He has been tolerating the dressing changes without complication. Fortunately there does not appear to be any signs of active infection which is great news. No fever chills noted we are still waiting on the results back from the TheraSkin investigation to see whether or not he qualifies for this treatment option. 07/30/2021 upon evaluation today patient appears to be doing a little better again in regard to his wound. He is showing signs of improvement which is great news. I am very pleased with where things stand. I do believe that honestly all things considered he is making good progress but were just not able to do anything as far as advanced modalities to speed this up as insurance has literally devout denied everything including a snap VAC, Apligraf, PuraPly, and TheraSkin. Again I am not really certain what else to do otherwise other than just try to continue along the path we are as far as getting this wound to heal. 08/06/2021 upon evaluation today patient appears to be doing well currently he is of peering to make progress slowly but nonetheless this is at least making progress at this point. There does not appear to be any signs of active infection at this time. No fevers, chills, nausea, vomiting, or diarrhea. With that being said I do think that we are headed in the right direction with what I am seeing currently. I do wish we could do the snap VAC but to be honest I think this is the best we can do with the collagen as it stands currently. 08/13/2021 upon evaluation today patient unfortunately appears to be doing a little bit more poorly in regard to the fact that he is having some pain on the side of his ankle. This does have me a little bit concerned this is on the edge of the wound. There is a very hard area under this that I presume to be more of a calcification although I cannot be certain this is not just a superficial bony  protrusion. I do feel it in the base of the wound but again I assumed more calcification due to inflammation from prior surgery but again that may not actually be the case. I do think that we probably need to go forward with an x-ray at this point to see if there is anything going on that could be a more significant issue here. Depending on the results of the x-ray we may be looking at an MRI as well. Objective Constitutional Well-nourished and well-hydrated in no acute distress. OLAVI, PEERCY ([id number]) Vitals Time Taken: 2:58 PM, Height: 66 in, Weight: 161 lbs, BMI: 26, Temperature: 98.2 F, Pulse: 68 bpm, Respiratory Rate: 16 breaths/min, Blood Pressure: 121/72 mmHg. Respiratory normal breathing without difficulty. Psychiatric this patient is able to make decisions and demonstrates good insight into disease process. Alert and Oriented x 3. pleasant and cooperative. General Notes: Upon inspection patient's wound bed actually showed signs of good granulation epithelization in a lot of areas and I am actually fairly pleased for the most part. With that being said unfortunately he does have some region here where he has if not bone at least a calcification which is what I thought before up underneath the skin. Nonetheless I am beginning to wonder if this does not actually track down to bone. So much of the wound looks good but he is having pain right at this location that makes me more concerned. Integumentary (Hair,  Skin) Wound #1 status is Open. Original cause of wound was Trauma. The date acquired was: 05/09/2021. The wound has been in treatment 8 weeks. The wound is located on the Right,Lateral Ankle. The wound measures 1.1cm length x 1.9cm width x 0.5cm depth; 1.641cm^2 area and 0.821cm^3 volume. There is Fat Layer (Subcutaneous Tissue) exposed. There is no tunneling or undermining noted. There is a medium amount of serosanguineous drainage noted. There is medium (34-66%) red, pink  granulation within the wound bed. There is a medium (34-66%) amount of necrotic tissue within the wound bed including Adherent Slough. Assessment Active Problems ICD-10 Laceration without foreign body, right ankle, initial encounter Non-pressure chronic ulcer of right ankle with fat layer exposed Essential (primary) hypertension Procedures Wound #1 Pre-procedure diagnosis of Wound #1 is a Trauma, Other located on the Right,Lateral Ankle . There was a Excisional Skin/Subcutaneous Tissue Debridement with a total area of 2.09 sq cm performed by Tommie Sams., PA-C. With the following instrument(s): Curette to remove Viable and Non-Viable tissue/material. Material removed includes Subcutaneous Tissue, Slough, Skin: Dermis, and Skin: Epidermis after achieving pain control using Lidocaine 4% Topical Solution. No specimens were taken. A time out was conducted at 15:33, prior to the start of the procedure. A Minimum amount of bleeding was controlled with Pressure. The procedure was tolerated well with a pain level of 0 throughout and a pain level of 0 following the procedure. Post Debridement Measurements: 1.1cm length x 1.9cm width x 0.5cm depth; 0.821cm^3 volume. Character of Wound/Ulcer Post Debridement is improved. Post procedure Diagnosis Wound #1: Same as Pre-Procedure Plan Follow-up Appointments: Return Appointment in 1 week. Bathing/ Shower/ Hygiene: May shower; gently cleanse wound with antibacterial soap, rinse and pat dry prior to dressing wounds Edema Control - Lymphedema / Segmental Compressive Device / Other: Elevate, Exercise Daily and Avoid Standing for Long Periods of Time. Elevate legs to the level of the heart and pump ankles as often as possible Elevate leg(s) parallel to the floor when sitting. Radiology ordered were: X-ray, ankle right - non healing wound , increased pain, area swollen WOUND #1: - Ankle Wound Laterality: Right, Lateral Cleanser: Soap and Water 3 x Per  Week/30 Days Discharge Instructions: Gently cleanse wound with antibacterial soap, rinse and pat dry prior to dressing wounds Primary Dressing: Prisma 4.34 (in) (Generic) 3 x Per Week/30 Days Discharge Instructions: Moisten w/normal saline or sterile water; Cover wound as directed. Do not remove from wound bed. LADARIAN, TUR ([id number]) Secondary Dressing: Gauze 3 x Per Week/30 Days Discharge Instructions: As directed: dry, moistened with saline or moistened with Dakins Solution Secondary Dressing: coban 3 x Per Week/30 Days 1. Would recommend currently that we going continue with the silver collagen followed by foam to pack and behind to hold in place. 2. I am also can recommend that we continue with the roll gauze to cover followed by Coban. 3. I am also going to recommend that we have the patient go for an x-ray of the ankle region. Depending on the results of the x-ray we may need to proceed to MRI. I want to check and make sure that there is nothing more significant going on concerning that he is having a lot of pain along the area right where there is a calcification potentially bony protrusion. He has had previous surgery but has been so long ago he does not believe there is any hardware in place but nonetheless that is a concern as well. We will see patient back for reevaluation  in 1 week here in the clinic. If anything worsens or changes patient will contact our office for additional recommendations. Electronic Signature(s) Signed: 08/13/2021 5:28:40 PM By: Worthy Keeler PA-C Entered By: Worthy Keeler on 08/13/2021 17:28:40 [name] ([id number]) -------------------------------------------------------------------------------- SuperBill Details Patient Name: [name] Date of Service: 08/13/2021 Medical Record Number: [id number] Patient Account Number: [id number] Date of Birth/Sex: [birth year] (60 y.o. M) Treating RN: Carlene Coria Primary Care Provider:  Halina Maidens Other Clinician: Referring Provider: Halina Maidens Treating Provider/Extender: Skipper Cliche in Treatment: 8 Diagnosis Coding ICD-10 Codes Code Description [phone] Laceration without foreign body, right ankle, initial encounter [id number] Non-pressure chronic ulcer of right ankle with fat layer exposed [place] (primary) hypertension Facility Procedures CPT4 Code: [id number] Description: [id number] - DEB SUBQ TISSUE 20 SQ CM/< Modifier: Quantity: 1 CPT4 Code: Description: ICD-10 Diagnosis Description [id number] Non-pressure chronic ulcer of right ankle with fat layer exposed Modifier: Quantity: Physician Procedures CPT4 [id number] Description: [id number] - WC PHYS LEVEL 4 - EST PT Modifier: 25 Quantity: 1 CPT4 Code: Description: ICD-10 Diagnosis Description S91.011A Laceration without foreign body, right ankle, initial encounter L97.312 Non-pressure chronic ulcer of right ankle with fat layer exposed I10 Essential (primary) hypertension Modifier: Quantity: CPT4 Code: [id number] Description: 11042 - WC PHYS SUBQ TISS 20 SQ CM Modifier: Quantity: 1 CPT4 Code: Description: ICD-10 Diagnosis Description 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

## 2021-08-13 NOTE — Progress Notes (Addendum)
NICHLOS, CHENG ([id number]) Visit Report for 08/13/2021 Arrival Information Details Patient Name: [name], [name] Date of Service: 08/13/2021 3:00 PM Medical Record Number: [id number] Patient Account Number: [id number] Date of Birth/Sex: Jun 21, 1961 (60 y.o. M) Treating RN: Carlene Coria Primary Care Adayah Arocho: Halina Maidens Other Clinician: Referring Deontay Ladnier: Halina Maidens Treating Fredderick Swanger/Extender: Skipper Cliche in Treatment: 8 Visit Information History Since Last Visit All ordered tests and consults were completed: No Patient Arrived: Ambulatory Added or deleted any medications: No Arrival Time: 14:56 Any new allergies or adverse reactions: No Accompanied By: wife Had a fall or experienced change in No Transfer Assistance: None activities of daily living that may affect Patient Identification Verified: Yes risk of falls: Secondary Verification Process Completed: Yes Signs or symptoms of abuse/neglect since last visito No Patient Requires Transmission-Based Precautions: No Hospitalized since last visit: No Patient Has Alerts: Yes Implantable device outside of the clinic excluding No Patient Alerts: NOT diabetic cellular tissue based products placed in the center since last visit: Has Dressing in Place as Prescribed: Yes Pain Present Now: Yes Electronic Signature(s) Signed: 08/18/2021 9:51:49 AM By: Carlene Coria RN Entered By: Carlene Coria on 08/13/2021 14:59:26 [name] ([id number]) -------------------------------------------------------------------------------- Clinic Level of Care Assessment Details Patient Name: [name] Date of Service: 08/13/2021 3:00 PM Medical Record Number: [id number] Patient Account Number: [id number] Date of Birth/Sex: 1961/04/05 (60 y.o. M) Treating RN: Carlene Coria Primary Care Rachella Basden: Halina Maidens Other Clinician: Referring Edrei Norgaard: Halina Maidens Treating Royalty Fakhouri/Extender: Skipper Cliche in Treatment:  8 Clinic Level of Care Assessment Items TOOL 1 Quantity Score '[]'$  - Use when EandM and Procedure is performed on INITIAL visit 0 ASSESSMENTS - Nursing Assessment / Reassessment '[]'$  - General Physical Exam (combine w/ comprehensive assessment (listed just below) when performed on new 0 pt. evals) '[]'$  - 0 Comprehensive Assessment (HX, ROS, Risk Assessments, Wounds Hx, etc.) ASSESSMENTS - Wound and Skin Assessment / Reassessment '[]'$  - Dermatologic / Skin Assessment (not related to wound area) 0 ASSESSMENTS - Ostomy and/or Continence Assessment and Care '[]'$  - Incontinence Assessment and Management 0 '[]'$  - 0 Ostomy Care Assessment and Management (repouching, etc.) PROCESS - Coordination of Care '[]'$  - Simple Patient / Family Education for ongoing care 0 '[]'$  - 0 Complex (extensive) Patient / Family Education for ongoing care '[]'$  - 0 Staff obtains Programmer, systems, Records, Test Results / Process Orders '[]'$  - 0 Staff telephones HHA, Nursing Homes / Clarify orders / etc '[]'$  - 0 Routine Transfer to another Facility (non-emergent condition) '[]'$  - 0 Routine Hospital Admission (non-emergent condition) '[]'$  - 0 New Admissions / Biomedical engineer / Ordering NPWT, Apligraf, etc. '[]'$  - 0 Emergency Hospital Admission (emergent condition) PROCESS - Special Needs '[]'$  - Pediatric / Minor Patient Management 0 '[]'$  - 0 Isolation Patient Management '[]'$  - 0 Hearing / Language / Visual special needs '[]'$  - 0 Assessment of Community assistance (transportation, D/C planning, etc.) '[]'$  - 0 Additional assistance / Altered mentation '[]'$  - 0 Support Surface(s) Assessment (bed, cushion, seat, etc.) INTERVENTIONS - Miscellaneous '[]'$  - External ear exam 0 '[]'$  - 0 Patient Transfer (multiple staff / Civil Service fast streamer / Similar devices) '[]'$  - 0 Simple Staple / Suture removal (25 or less) '[]'$  - 0 Complex Staple / Suture removal (26 or more) '[]'$  - 0 Hypo/Hyperglycemic Management (do not check if billed separately) '[]'$  - 0 Ankle /  Brachial Index (ABI) - do not check if billed separately Has the patient been seen at the hospital within the last three years: Yes  Total Score: 0 Level Of Care: ____ [name] ([id number]) Electronic Signature(s) Signed: 08/18/2021 9:51:49 AM By: Carlene Coria RN Entered By: Carlene Coria on 08/13/2021 15:45:56 [name] ([id number]) -------------------------------------------------------------------------------- Encounter Discharge Information Details Patient Name: [name] Date of Service: 08/13/2021 3:00 PM Medical Record Number: [id number] Patient Account Number: [id number] Date of Birth/Sex: 06-Sep-1961 (60 y.o. M) Treating RN: Carlene Coria Primary Care Ardit Danh: Halina Maidens Other Clinician: Referring Mellany Dinsmore: Halina Maidens Treating Melat Wrisley/Extender: Skipper Cliche in Treatment: 8 Encounter Discharge Information Items Post Procedure Vitals Discharge Condition: Stable Temperature (F): 98.2 Ambulatory Status: Ambulatory Pulse (bpm): 68 Discharge Destination: Home Respiratory Rate (breaths/min): 16 Transportation: Private Auto Blood Pressure (mmHg): 121/72 Accompanied By: wife Schedule Follow-up Appointment: Yes Clinical Summary of Care: Patient Declined Electronic Signature(s) Signed: 08/18/2021 9:51:49 AM By: Carlene Coria RN Entered By: Carlene Coria on 08/13/2021 15:46:53 [name] ([id number]) -------------------------------------------------------------------------------- Lower Extremity Assessment Details Patient Name: [name] Date of Service: 08/13/2021 3:00 PM Medical Record Number: [id number] Patient Account Number: [id number] Date of Birth/Sex: Dec 02, 1961 (60 y.o. M) Treating RN: Carlene Coria Primary Care Chanoch Mccleery: Halina Maidens Other Clinician: Referring Ambreen Tufte: Halina Maidens Treating Johndaniel Catlin/Extender: Jeri Cos Weeks in Treatment: 8 Edema Assessment Assessed: [Left: No] [Right: No] Edema: [Left: Ye]  [Right: s] Vascular Assessment Pulses: Dorsalis Pedis Palpable: [Right:Yes] Electronic Signature(s) Signed: 08/18/2021 9:51:49 AM By: Carlene Coria RN Entered By: Carlene Coria on 08/13/2021 15:10:25 [name] ([id number]) -------------------------------------------------------------------------------- Multi Wound Chart Details Patient Name: [name] Date of Service: 08/13/2021 3:00 PM Medical Record Number: [id number] Patient Account Number: [id number] Date of Birth/Sex: [birth year] (60 y.o. M) Treating RN: Carlene Coria Primary Care Cynthia Cogle: Halina Maidens Other Clinician: Referring Keila Turan: Halina Maidens Treating Daiton Cowles/Extender: Skipper Cliche in Treatment: 8 Vital Signs Height(in): 66 Pulse(bpm): 68 Weight(lbs): 161 Blood Pressure(mmHg): 121/72 Body Mass Index(BMI): 26 Temperature(F): 98.2 Respiratory Rate(breaths/min): 16 Photos: [N/A:N/A] Wound Location: Right, Lateral Ankle N/A N/A Wounding Event: Trauma N/A N/A Primary Etiology: Trauma, Other N/A N/A Comorbid History: Hypertension N/A N/A Date Acquired: 05/09/2021 N/A N/A Weeks of Treatment: 8 N/A N/A Wound Status: Open N/A N/A Measurements L x W x D (cm) 1.1x1.9x0.5 N/A N/A Area (cm) : 1.641 N/A N/A Volume (cm) : 0.821 N/A N/A % Reduction in Area: -109.00% N/A N/A % Reduction in Volume: -108.90% N/A N/A Classification: Full Thickness Without Exposed N/A N/A Support Structures Exudate Amount: Medium N/A N/A Exudate Type: Serosanguineous N/A N/A Exudate Color: red, brown N/A N/A Granulation Amount: Medium (34-66%) N/A N/A Granulation Quality: Red, Pink N/A N/A Necrotic Amount: Medium (34-66%) N/A N/A Exposed Structures: Fat Layer (Subcutaneous Tissue): N/A N/A Yes Fascia: No Tendon: No Muscle: No Joint: No Bone: No Epithelialization: None N/A N/A Treatment Notes Electronic Signature(s) Signed: 08/18/2021 9:51:49 AM By: Carlene Coria RN Entered By: Carlene Coria on 08/13/2021  15:30:41 [name] ([id number]) -------------------------------------------------------------------------------- [place] Details Patient Name: [name] Date of Service: 08/13/2021 3:00 PM Medical Record Number: [id number] Patient Account Number: [id number] Date of Birth/Sex: June 04, 1961 (60 y.o. M) Treating RN: Carlene Coria Primary Care Milen Lengacher: Halina Maidens Other Clinician: Referring Erielle Gawronski: Halina Maidens Treating Stephanie Mcglone/Extender: Skipper Cliche in Treatment: 8 Active Inactive Wound/Skin Impairment Nursing Diagnoses: Knowledge deficit related to ulceration/compromised skin integrity Goals: Patient/caregiver will verbalize understanding of skin care regimen Date Initiated: 06/18/2021 Target Resolution Date: 08/18/2021 Goal Status: Active Ulcer/skin breakdown will have a volume reduction of 30% by week 4 Date Initiated: 06/18/2021 Date Inactivated: 07/16/2021 Target Resolution Date: 07/18/2021 Goal Status: Unmet  Unmet Reason: comorbities Ulcer/skin breakdown will have a volume reduction of 50% by week 8 Date Initiated: 06/18/2021 Target Resolution Date: 08/18/2021 Goal Status: Active Ulcer/skin breakdown will have a volume reduction of 80% by week 12 Date Initiated: 06/18/2021 Target Resolution Date: 09/18/2021 Goal Status: Active Ulcer/skin breakdown will heal within 14 weeks Date Initiated: 06/18/2021 Target Resolution Date: 10/18/2021 Goal Status: Active Interventions: Assess patient/caregiver ability to obtain necessary supplies Assess patient/caregiver ability to perform ulcer/skin care regimen upon admission and as needed Assess ulceration(s) every visit Notes: Electronic Signature(s) Signed: 08/18/2021 9:51:49 AM By: Carlene Coria RN Entered By: Carlene Coria on 08/13/2021 15:30:34 [name] ([id number]) -------------------------------------------------------------------------------- Pain Assessment Details Patient  Name: [name] Date of Service: 08/13/2021 3:00 PM Medical Record Number: [id number] Patient Account Number: [id number] Date of Birth/Sex: 07/20/1961 (60 y.o. M) Treating RN: Carlene Coria Primary Care Lorilyn Laitinen: Halina Maidens Other Clinician: Referring Martyna Thorns: Halina Maidens Treating Lavilla Delamora/Extender: Skipper Cliche in Treatment: 8 Active Problems Location of Pain Severity and Description of Pain Patient Has Paino Yes Site Locations With Dressing Change: Yes Duration of the Pain. Constant / Intermittento Intermittent How Long Does it Lasto Hours: Minutes: 15 Rate the pain. Current Pain Level: 5 Worst Pain Level: 7 Least Pain Level: 0 Tolerable Pain Level: 5 Character of Pain Describe the Pain: Aching, Burning Pain Management and Medication Current Pain Management: Medication: Yes Cold Application: No Rest: Yes Massage: No Activity: No T.E.N.S.: No Heat Application: No Leg drop or elevation: No Is the Current Pain Management Adequate: Inadequate How does your wound impact your activities of daily livingo Sleep: Yes Bathing: No Appetite: No Relationship With Others: No Bladder Continence: No Emotions: No Bowel Continence: No Work: No Toileting: No Drive: No Dressing: No Hobbies: No Electronic Signature(s) Signed: 08/18/2021 9:51:49 AM By: Carlene Coria RN Entered By: Carlene Coria on 08/13/2021 15:00:39 [name] ([id number]) -------------------------------------------------------------------------------- Patient/Caregiver Education Details Patient Name: [name] Date of Service: 08/13/2021 3:00 PM Medical Record Number: [id number] Patient Account Number: [id number] Date of Birth/Gender: 1961/08/11 (60 y.o. M) Treating RN: Carlene Coria Primary Care Physician: Halina Maidens Other Clinician: Referring Physician: Halina Maidens Treating Physician/Extender: Skipper Cliche in Treatment: 8 Education Assessment Education  Provided To: Patient Education Topics Provided Wound/Skin Impairment: Methods: Explain/Verbal Responses: State content correctly Electronic Signature(s) Signed: 08/18/2021 9:51:49 AM By: Carlene Coria RN Entered By: Carlene Coria on 08/13/2021 15:46:11 [name] ([id number]) -------------------------------------------------------------------------------- Wound Assessment Details Patient Name: [name] Date of Service: 08/13/2021 3:00 PM Medical Record Number: [id number] Patient Account Number: [id number] Date of Birth/Sex: 1961/06/10 (60 y.o. M) Treating RN: Carlene Coria Primary Care Nili Honda: Halina Maidens Other Clinician: Referring Harrietta Incorvaia: Halina Maidens Treating Dracen Reigle/Extender: Jeri Cos Weeks in Treatment: 8 Wound Status Wound Number: 1 Primary Etiology: Trauma, Other Wound Location: Right, Lateral Ankle Wound Status: Open Wounding Event: Trauma Comorbid History: Hypertension Date Acquired: 05/09/2021 Weeks Of Treatment: 8 Clustered Wound: No Photos Wound Measurements Length: (cm) 1.1 Width: (cm) 1.9 Depth: (cm) 0.5 Area: (cm) 1.641 Volume: (cm) 0.821 % Reduction in Area: -109% % Reduction in Volume: -108.9% Epithelialization: None Tunneling: No Undermining: No Wound Description Classification: Full Thickness Without Exposed Support Structures Exudate Amount: Medium Exudate Type: Serosanguineous Exudate Color: red, brown Foul Odor After Cleansing: No Slough/Fibrino Yes Wound Bed Granulation Amount: Medium (34-66%) Exposed Structure Granulation Quality: Red, Pink Fascia Exposed: No Necrotic Amount: Medium (34-66%) Fat Layer (Subcutaneous Tissue) Exposed: Yes Necrotic Quality: Adherent Slough Tendon Exposed: No Muscle Exposed: No Joint Exposed: No Bone Exposed:  No Treatment Notes Wound #1 (Ankle) Wound Laterality: Right, Lateral Cleanser Soap and Water Discharge Instruction: Gently cleanse wound with antibacterial soap, rinse  and pat dry prior to dressing wounds [place], Alfonzia G. ([id number]) Topical Primary Dressing Prisma 4.34 (in) Discharge Instruction: Moisten w/normal saline or sterile water; Cover wound as directed. Do not remove from wound bed. Secondary Dressing Gauze Discharge Instruction: As directed: dry, moistened with saline or moistened with Dakins Solution coban Secured With Compression Wrap Compression Stockings Add-Ons Electronic Signature(s) Signed: 08/18/2021 9:51:49 AM By: Carlene Coria RN Entered By: Carlene Coria on 08/13/2021 15:05:39 [name] ([id number]) -------------------------------------------------------------------------------- [place] Details Patient Name: [name] Date of Service: 08/13/2021 3:00 PM Medical Record Number: [id number] Patient Account Number: [id number] Date of Birth/Sex: November 12, 1961 (60 y.o. M) Treating RN: Carlene Coria Primary Care Facundo Allemand: Halina Maidens Other Clinician: Referring Vedika Dumlao: Halina Maidens Treating Mando Blatz/Extender: Skipper Cliche in Treatment: 8 Vital Signs Time Taken: 14:58 Temperature (F): 98.2 Height (in): 66 Pulse (bpm): 68 Weight (lbs): 161 Respiratory Rate (breaths/min): 16 Body Mass Index (BMI): 26 Blood Pressure (mmHg): 121/72 Reference Range: 80 - 120 mg / dl Electronic Signature(s) Signed: 08/18/2021 9:51:49 AM By: Carlene Coria RN Entered By: Carlene Coria on 08/13/2021 14:59:51

## 2021-08-17 ENCOUNTER — Ambulatory Visit
Admission: RE | Admit: 2021-08-17 | Discharge: 2021-08-17 | Disposition: A | Discharge status: Home / Self Care | Payer: Managed Care, Other (non HMO) | Source: Ambulatory Visit | Attending: Physician Assistant | Admitting: Physician Assistant

## 2021-08-17 ENCOUNTER — Ambulatory Visit
Admission: RE | Admit: 2021-08-17 | Discharge: 2021-08-17 | Disposition: A | Discharge status: Home / Self Care | Payer: Managed Care, Other (non HMO) | Attending: Physician Assistant | Admitting: Physician Assistant

## 2021-08-17 ENCOUNTER — Other Ambulatory Visit: Payer: Self-pay

## 2021-08-17 DIAGNOSIS — S81801A Unspecified open wound, right lower leg, initial encounter: Secondary | ICD-10-CM | POA: Diagnosis not present

## 2021-08-20 ENCOUNTER — Other Ambulatory Visit: Payer: Self-pay

## 2021-08-20 ENCOUNTER — Encounter: Payer: Managed Care, Other (non HMO) | Admitting: Physician Assistant

## 2021-08-20 DIAGNOSIS — L97312 Non-pressure chronic ulcer of right ankle with fat layer exposed: Secondary | ICD-10-CM | POA: Diagnosis not present

## 2021-08-20 NOTE — Progress Notes (Addendum)
SAHEJ, AMBERSON ([id number]) Visit Report for 08/20/2021 Arrival Information Details Patient Name: [name], [name] Date of Service: 08/20/2021 3:00 PM Medical Record Number: [id number] Patient Account Number: [id number] Date of Birth/Sex: 1961-06-11 (60 y.o. M) Treating RN: Carlene Coria Primary Care Sabina Beavers: Halina Maidens Other Clinician: Referring Zell Doucette: Halina Maidens Treating Eric Nees/Extender: Skipper Cliche in Treatment: 9 Visit Information History Since Last Visit All ordered tests and consults were completed: No Patient Arrived: Ambulatory Added or deleted any medications: No Arrival Time: 14:49 Any new allergies or adverse reactions: No Accompanied By: self Had a fall or experienced change in No Transfer Assistance: None activities of daily living that may affect Patient Identification Verified: Yes risk of falls: Secondary Verification Process Completed: Yes Signs or symptoms of abuse/neglect since last visito No Patient Requires Transmission-Based Precautions: No Hospitalized since last visit: No Patient Has Alerts: Yes Implantable device outside of the clinic excluding No Patient Alerts: NOT diabetic cellular tissue based products placed in the center since last visit: Has Dressing in Place as Prescribed: Yes Pain Present Now: No Electronic Signature(s) Signed: 08/23/2021 1:47:40 PM By: Carlene Coria RN Entered By: Carlene Coria on 08/20/2021 14:54:22 [name] ([id number]) -------------------------------------------------------------------------------- Clinic Level of Care Assessment Details Patient Name: [name] Date of Service: 08/20/2021 3:00 PM Medical Record Number: [id number] Patient Account Number: [id number] Date of Birth/Sex: [birth year] (60 y.o. M) Treating RN: Carlene Coria Primary Care Jatavius Ellenwood: Halina Maidens Other Clinician: Referring Mennie Spiller: Halina Maidens Treating Kadiatou Oplinger/Extender: Skipper Cliche in Treatment:  9 Clinic Level of Care Assessment Items TOOL 1 Quantity Score '[]'$  - Use when EandM and Procedure is performed on INITIAL visit 0 ASSESSMENTS - Nursing Assessment / Reassessment '[]'$  - General Physical Exam (combine w/ comprehensive assessment (listed just below) when performed on new 0 pt. evals) '[]'$  - 0 Comprehensive Assessment (HX, ROS, Risk Assessments, Wounds Hx, etc.) ASSESSMENTS - Wound and Skin Assessment / Reassessment '[]'$  - Dermatologic / Skin Assessment (not related to wound area) 0 ASSESSMENTS - Ostomy and/or Continence Assessment and Care '[]'$  - Incontinence Assessment and Management 0 '[]'$  - 0 Ostomy Care Assessment and Management (repouching, etc.) PROCESS - Coordination of Care '[]'$  - Simple Patient / Family Education for ongoing care 0 '[]'$  - 0 Complex (extensive) Patient / Family Education for ongoing care '[]'$  - 0 Staff obtains Programmer, systems, Records, Test Results / Process Orders '[]'$  - 0 Staff telephones HHA, Nursing Homes / Clarify orders / etc '[]'$  - 0 Routine Transfer to another Facility (non-emergent condition) '[]'$  - 0 Routine Hospital Admission (non-emergent condition) '[]'$  - 0 New Admissions / Biomedical engineer / Ordering NPWT, Apligraf, etc. '[]'$  - 0 Emergency Hospital Admission (emergent condition) PROCESS - Special Needs '[]'$  - Pediatric / Minor Patient Management 0 '[]'$  - 0 Isolation Patient Management '[]'$  - 0 Hearing / Language / Visual special needs '[]'$  - 0 Assessment of Community assistance (transportation, D/C planning, etc.) '[]'$  - 0 Additional assistance / Altered mentation '[]'$  - 0 Support Surface(s) Assessment (bed, cushion, seat, etc.) INTERVENTIONS - Miscellaneous '[]'$  - External ear exam 0 '[]'$  - 0 Patient Transfer (multiple staff / Civil Service fast streamer / Similar devices) '[]'$  - 0 Simple Staple / Suture removal (25 or less) '[]'$  - 0 Complex Staple / Suture removal (26 or more) '[]'$  - 0 Hypo/Hyperglycemic Management (do not check if billed separately) '[]'$  - 0 Ankle /  Brachial Index (ABI) - do not check if billed separately Has the patient been seen at the hospital within the last three years: Yes  Total Score: 0 Level Of Care: ____ [name] ([id number]) Electronic Signature(s) Signed: 08/23/2021 1:47:40 PM By: Carlene Coria RN Entered By: Carlene Coria on 08/20/2021 15:24:57 [name] ([id number]) -------------------------------------------------------------------------------- Encounter Discharge Information Details Patient Name: [name] Date of Service: 08/20/2021 3:00 PM Medical Record Number: [id number] Patient Account Number: [id number] Date of Birth/Sex: 06-06-1961 (60 y.o. M) Treating RN: Carlene Coria Primary Care Levii Hairfield: Halina Maidens Other Clinician: Referring Coumba Kellison: Halina Maidens Treating Lola Lofaro/Extender: Skipper Cliche in Treatment: 9 Encounter Discharge Information Items Post Procedure Vitals Discharge Condition: Stable Temperature (F): 98 Ambulatory Status: Ambulatory Pulse (bpm): 67 Discharge Destination: Home Respiratory Rate (breaths/min): 18 Transportation: Private Auto Blood Pressure (mmHg): 136/77 Accompanied By: self Schedule Follow-up Appointment: Yes Clinical Summary of Care: Patient Declined Electronic Signature(s) Signed: 08/23/2021 1:47:40 PM By: Carlene Coria RN Entered By: Carlene Coria on 08/20/2021 15:26:31 [name] ([id number]) -------------------------------------------------------------------------------- Lower Extremity Assessment Details Patient Name: [name] Date of Service: 08/20/2021 3:00 PM Medical Record Number: [id number] Patient Account Number: [id number] Date of Birth/Sex: 01-Oct-1961 (60 y.o. M) Treating RN: Carlene Coria Primary Care Ernestina Joe: Halina Maidens Other Clinician: Referring Hyun Reali: Halina Maidens Treating Jearlean Demauro/Extender: Jeri Cos Weeks in Treatment: 9 Vascular Assessment Pulses: Dorsalis Pedis Palpable:  [Right:Yes] Electronic Signature(s) Signed: 08/23/2021 1:47:40 PM By: Carlene Coria RN Entered By: Carlene Coria on 08/20/2021 14:59:34 [name] ([id number]) -------------------------------------------------------------------------------- Multi Wound Chart Details Patient Name: [name] Date of Service: 08/20/2021 3:00 PM Medical Record Number: [id number] Patient Account Number: [id number] Date of Birth/Sex: December 27, 1960 (60 y.o. M) Treating RN: Carlene Coria Primary Care Collie Kittel: Halina Maidens Other Clinician: Referring Arnett Galindez: Halina Maidens Treating Georgetta Crafton/Extender: Skipper Cliche in Treatment: 9 Vital Signs Height(in): 66 Pulse(bpm): 67 Weight(lbs): 161 Blood Pressure(mmHg): 136/77 Body Mass Index(BMI): 26 Temperature(F): 98.2 Respiratory Rate(breaths/min): 18 Photos: [N/A:N/A] Wound Location: Right, Lateral Ankle N/A N/A Wounding Event: Trauma N/A N/A Primary Etiology: Trauma, Other N/A N/A Comorbid History: Hypertension N/A N/A Date Acquired: 05/09/2021 N/A N/A Weeks of Treatment: 9 N/A N/A Wound Status: Open N/A N/A Measurements L x W x D (cm) 1.1x2x0.5 N/A N/A Area (cm) : 1.728 N/A N/A Volume (cm) : 0.864 N/A N/A % Reduction in Area: -120.10% N/A N/A % Reduction in Volume: -119.80% N/A N/A Classification: Full Thickness Without Exposed N/A N/A Support Structures Exudate Amount: Medium N/A N/A Exudate Type: Serosanguineous N/A N/A Exudate Color: red, brown N/A N/A Granulation Amount: Medium (34-66%) N/A N/A Granulation Quality: Red, Pink N/A N/A Necrotic Amount: Medium (34-66%) N/A N/A Exposed Structures: Fat Layer (Subcutaneous Tissue): N/A N/A Yes Fascia: No Tendon: No Muscle: No Joint: No Bone: No Epithelialization: None N/A N/A Treatment Notes Electronic Signature(s) Signed: 08/23/2021 1:47:40 PM By: Carlene Coria RN Entered By: Carlene Coria on 08/20/2021 15:21:19 [name]  ([id number]) -------------------------------------------------------------------------------- [place] Details Patient Name: [name] Date of Service: 08/20/2021 3:00 PM Medical Record Number: [id number] Patient Account Number: [id number] Date of Birth/Sex: 05-06-1961 (60 y.o. M) Treating RN: Carlene Coria Primary Care Carmel Waddington: Halina Maidens Other Clinician: Referring Kris Burd: Halina Maidens Treating Avalina Benko/Extender: Skipper Cliche in Treatment: 9 Active Inactive Wound/Skin Impairment Nursing Diagnoses: Knowledge deficit related to ulceration/compromised skin integrity Goals: Patient/caregiver will verbalize understanding of skin care regimen Date Initiated: 06/18/2021 Target Resolution Date: 09/18/2021 Goal Status: Active Ulcer/skin breakdown will have a volume reduction of 30% by week 4 Date Initiated: 06/18/2021 Date Inactivated: 07/16/2021 Target Resolution Date: 07/18/2021 Goal Status: Unmet Unmet Reason: comorbities Ulcer/skin breakdown will have a volume reduction of 50%  by week 8 Date Initiated: 06/18/2021 Date Inactivated: 08/20/2021 Target Resolution Date: 08/18/2021 Goal Status: Unmet Unmet Reason: comorbites Ulcer/skin breakdown will have a volume reduction of 80% by week 12 Date Initiated: 06/18/2021 Target Resolution Date: 09/18/2021 Goal Status: Active Ulcer/skin breakdown will heal within 14 weeks Date Initiated: 06/18/2021 Target Resolution Date: 10/18/2021 Goal Status: Active Interventions: Assess patient/caregiver ability to obtain necessary supplies Assess patient/caregiver ability to perform ulcer/skin care regimen upon admission and as needed Assess ulceration(s) every visit Notes: Electronic Signature(s) Signed: 08/23/2021 1:47:40 PM By: Carlene Coria RN Entered By: Carlene Coria on 08/20/2021 15:00:45 [name] ([id number]) -------------------------------------------------------------------------------- Pain  Assessment Details Patient Name: [name] Date of Service: 08/20/2021 3:00 PM Medical Record Number: [id number] Patient Account Number: [id number] Date of Birth/Sex: 1961/06/04 (60 y.o. M) Treating RN: Carlene Coria Primary Care Arrin Ishler: Halina Maidens Other Clinician: Referring Darene Nappi: Halina Maidens Treating Brondon Wann/Extender: Skipper Cliche in Treatment: 9 Active Problems Location of Pain Severity and Description of Pain Patient Has Paino No Site Locations Pain Management and Medication Current Pain Management: Electronic Signature(s) Signed: 08/23/2021 1:47:40 PM By: Carlene Coria RN Entered By: Carlene Coria on 08/20/2021 14:55:00 [name] ([id number]) -------------------------------------------------------------------------------- Patient/Caregiver Education Details Patient Name: [name] Date of Service: 08/20/2021 3:00 PM Medical Record Number: [id number] Patient Account Number: [id number] Date of Birth/Gender: 17-Apr-1961 (60 y.o. M) Treating RN: Carlene Coria Primary Care Physician: Halina Maidens Other Clinician: Referring Physician: Halina Maidens Treating Physician/Extender: Skipper Cliche in Treatment: 9 Education Assessment Education Provided To: Patient Education Topics Provided Wound/Skin Impairment: Methods: Explain/Verbal Responses: State content correctly Electronic Signature(s) Signed: 08/23/2021 1:47:40 PM By: Carlene Coria RN Entered By: Carlene Coria on 08/20/2021 15:25:12 [name] ([id number]) -------------------------------------------------------------------------------- Wound Assessment Details Patient Name: [name] Date of Service: 08/20/2021 3:00 PM Medical Record Number: [id number] Patient Account Number: [id number] Date of Birth/Sex: 10-15-1961 (60 y.o. M) Treating RN: Carlene Coria Primary Care Retha Bither: Halina Maidens Other Clinician: Referring Damari Suastegui: Halina Maidens Treating  Maleya Leever/Extender: Jeri Cos Weeks in Treatment: 9 Wound Status Wound Number: 1 Primary Etiology: Trauma, Other Wound Location: Right, Lateral Ankle Wound Status: Open Wounding Event: Trauma Comorbid History: Hypertension Date Acquired: 05/09/2021 Weeks Of Treatment: 9 Clustered Wound: No Photos Wound Measurements Length: (cm) 1.1 Width: (cm) 2 Depth: (cm) 0.5 Area: (cm) 1.728 Volume: (cm) 0.864 % Reduction in Area: -120.1% % Reduction in Volume: -119.8% Epithelialization: None Tunneling: No Undermining: No Wound Description Classification: Full Thickness Without Exposed Support Structures Exudate Amount: Medium Exudate Type: Serosanguineous Exudate Color: red, brown Foul Odor After Cleansing: No Slough/Fibrino Yes Wound Bed Granulation Amount: Medium (34-66%) Exposed Structure Granulation Quality: Red, Pink Fascia Exposed: No Necrotic Amount: Medium (34-66%) Fat Layer (Subcutaneous Tissue) Exposed: Yes Necrotic Quality: Adherent Slough Tendon Exposed: No Muscle Exposed: No Joint Exposed: No Bone Exposed: No Treatment Notes Wound #1 (Ankle) Wound Laterality: Right, Lateral Cleanser Soap and Water Discharge Instruction: Gently cleanse wound with antibacterial soap, rinse and pat dry prior to dressing wounds Peri-Wound Care [name], [name] ([id number]) Topical Primary Dressing Prisma 4.34 (in) Discharge Instruction: Moisten w/normal saline or sterile water; Cover wound as directed. Do not remove from wound bed. Secondary Dressing Gauze Discharge Instruction: As directed: dry, moistened with saline or moistened with Dakins Solution coban Secured With Compression Wrap Compression Stockings Add-Ons Electronic Signature(s) Signed: 08/23/2021 1:47:40 PM By: Carlene Coria RN Entered By: Carlene Coria on 08/20/2021 14:59:03 [name] ([id number]) -------------------------------------------------------------------------------- [place] Details Patient  Name: [name] Date of Service: 08/20/2021 3:00  PM Medical Record Number: [id number] Patient Account Number: [id number] Date of Birth/Sex: 05/24/1961 (59 y.o. M) Treating RN: Carlene Coria Primary Care Josi Roediger: Halina Maidens Other Clinician: Referring Cuinn Westerhold: Halina Maidens Treating Wyley Hack/Extender: Skipper Cliche in Treatment: 9 Vital Signs Time Taken: 14:54 Temperature (F): 98.2 Height (in): 66 Pulse (bpm): 67 Weight (lbs): 161 Respiratory Rate (breaths/min): 18 Body Mass Index (BMI): 26 Blood Pressure (mmHg): 136/77 Reference Range: 80 - 120 mg / dl Electronic Signature(s) Signed: 08/23/2021 1:47:40 PM By: Carlene Coria RN Entered By: Carlene Coria on 08/20/2021 14:54:44

## 2021-08-20 NOTE — Progress Notes (Addendum)
[name], [name] ([id number]) Visit Report for 08/20/2021 Chief Complaint Document Details Patient Name: [name], [name]. Date of Service: 08/20/2021 3:00 PM Medical Record Number: [id number] Patient Account Number: [id number] Date of Birth/Sex: 1961/06/18 (60 y.o. M) Treating RN: Carlene Coria Primary Care Provider: Halina Maidens Other Clinician: Referring Provider: Halina Maidens Treating Provider/Extender: Skipper Cliche in Treatment: 9 Information Obtained from: Patient Chief Complaint Right ankle laceration Electronic Signature(s) Signed: 08/20/2021 2:51:22 PM By: Worthy Keeler PA-C Entered By: Worthy Keeler on 08/20/2021 14:51:21 [name] ([id number]) -------------------------------------------------------------------------------- Debridement Details Patient Name: [name] Date of Service: 08/20/2021 3:00 PM Medical Record Number: [id number] Patient Account Number: [id number] Date of Birth/Sex: 08-Jul-1961 (60 y.o. M) Treating RN: Carlene Coria Primary Care Provider: Halina Maidens Other Clinician: Referring Provider: Halina Maidens Treating Provider/Extender: Skipper Cliche in Treatment: 9 Debridement Performed for Wound #1 Right,Lateral Ankle Assessment: Performed By: Physician Tommie Sams., PA-C Debridement Type: Debridement Level of Consciousness (Pre- Awake and Alert procedure): Pre-procedure Verification/Time Out Yes - 15:20 Taken: Start Time: 15:20 Pain Control: Lidocaine 4% Topical Solution Total Area Debrided (L x W): 1.1 (cm) x 2 (cm) = 2.2 (cm) Tissue and other material Viable, Non-Viable, Subcutaneous, Skin: Dermis , Skin: Epidermis debrided: Level: Skin/Subcutaneous Tissue Debridement Description: Excisional Instrument: Curette Bleeding: Minimum Hemostasis Achieved: Pressure End Time: 15:25 Procedural Pain: 0 Post Procedural Pain: 0 Response to Treatment: Procedure was tolerated well Level of Consciousness  (Post- Awake and Alert procedure): Post Debridement Measurements of Total Wound Length: (cm) 1.1 Width: (cm) 2 Depth: (cm) 0.5 Volume: (cm) 0.864 Character of Wound/Ulcer Post Debridement: Improved Post Procedure Diagnosis Same as Pre-procedure Electronic Signature(s) Signed: 08/20/2021 3:53:32 PM By: Worthy Keeler PA-C Signed: 08/23/2021 1:47:40 PM By: Carlene Coria RN Entered By: Carlene Coria on 08/20/2021 15:24:25 [name] ([id number]) -------------------------------------------------------------------------------- HPI Details Patient Name: [name] Date of Service: 08/20/2021 3:00 PM Medical Record Number: [id number] Patient Account Number: [id number] Date of Birth/Sex: 1961/07/10 (60 y.o. M) Treating RN: Carlene Coria Primary Care Provider: Halina Maidens Other Clinician: Referring Provider: Halina Maidens Treating Provider/Extender: Skipper Cliche in Treatment: 9 History of Present Illness HPI Description: 06/14/2021 upon evaluation today patient appears to be doing somewhat poorly in regard to the wound on his right ankle region. This was an injury that apparently occurred at work according to what he is telling me today. He is seen with his wife in the office at this point. Nonetheless the patient appears to have a circular opening with necrotic tissue in the base of the wound. This does not appear to be doing nearly as good as I would hope. Fortunately there does not appear to be any signs of infection though he does have a significant amount of necrotic tissue this is going to take some time to heal. He also has previously had surgery in this area there is a lot of scar tissue that will again complicate things and extended healing process. The patient does have hypertension otherwise no major medical problems. 06/25/2021 upon evaluation today patient presents for reevaluation here in our clinic he actually does seem to be doing better in regard to  his wound on the right lateral ankle. He has been tolerating the dressing changes without complication. Fortunately I do not see any signs of infection which is great news. 07/02/2021 upon evaluation today patient appears to be doing about the same in regard to his wound. With that being said I do believe that this is cleaner there  was a little bit of slough noted we have been using Iodoflex. I was able to see a little bit more granulation tissue starting to try to build up but I think the scar tissue here is going to be the biggest slowing factor with regard to healing. That was discussed with the patient and his wife today. Nonetheless I think we want to switch up things based on what I see at this point. 07/09/2021 upon evaluation today patient appears to be doing about the same in regard to his ankle ulcer. He has been tolerating the dressing changes without complication. Fortunately there does not appear to be any signs of active infection. No fevers, chills, nausea, vomiting, or diarrhea. 07/16/2021 upon evaluation today patient appears to be doing better to some degree in regard to his wound on ankle region. He actually showed some signs of new granulation growth which is actually a good thing. Fortunately there does not appear to be any evidence of active infection and wound did not appear to be too dry upon evaluation today which is also good news. No fevers, chills, nausea, vomiting, or diarrhea. 07/23/2021 upon evaluation today patient appears to be doing a little better in regard to his wound. He has been tolerating the dressing changes without complication. Fortunately there does not appear to be any signs of active infection which is great news. No fever chills noted we are still waiting on the results back from the TheraSkin investigation to see whether or not he qualifies for this treatment option. 07/30/2021 upon evaluation today patient appears to be doing a little better again in regard  to his wound. He is showing signs of improvement which is great news. I am very pleased with where things stand. I do believe that honestly all things considered he is making good progress but were just not able to do anything as far as advanced modalities to speed this up as insurance has literally devout denied everything including a snap VAC, Apligraf, PuraPly, and TheraSkin. Again I am not really certain what else to do otherwise other than just try to continue along the path we are as far as getting this wound to heal. 08/06/2021 upon evaluation today patient appears to be doing well currently he is of peering to make progress slowly but nonetheless this is at least making progress at this point. There does not appear to be any signs of active infection at this time. No fevers, chills, nausea, vomiting, or diarrhea. With that being said I do think that we are headed in the right direction with what I am seeing currently. I do wish we could do the snap VAC but to be honest I think this is the best we can do with the collagen as it stands currently. 08/13/2021 upon evaluation today patient unfortunately appears to be doing a little bit more poorly in regard to the fact that he is having some pain on the side of his ankle. This does have me a little bit concerned this is on the edge of the wound. There is a very hard area under this that I presume to be more of a calcification although I cannot be certain this is not just a superficial bony protrusion. I do feel it in the base of the wound but again I assumed more calcification due to inflammation from prior surgery but again that may not actually be the case. I do think that we probably need to go forward with an x-ray at this point  to see if there is anything going on that could be a more significant issue here. Depending on the results of the x-ray we may be looking at an MRI as well. 08/17/2021 upon evaluation today patient appears to be doing well  with regard to his ankle ulcer. This actually is showing some signs of improvement which is great news and overall very pleased in that regard. Fortunately there does not appear to be any signs of active infection which is great news as well. No fevers, chills, nausea, vomiting, or diarrhea. With that being said the x-ray showed some surgical abnormalities and along the lateral ankle as well there was some mention of an irregularity. With that being said there was nothing specifically to indicate osteomyelitis as a probable diagnosis here. With that being said I did discuss this with the patient and his wife today their opinion is to hold off on an MRI at this point especially since his wound seems to be showing signs of healing. Fortunately there is no evidence of active infection systemically at this point. Electronic Signature(s) Signed: 08/20/2021 3:32:08 PM By: Worthy Keeler PA-C Entered By: Worthy Keeler on 08/20/2021 15:32:08 [name] ([id number]) -------------------------------------------------------------------------------- Physical Exam Details Patient Name: [name] Date of Service: 08/20/2021 3:00 PM Medical Record Number: [id number] Patient Account Number: [id number] Date of Birth/Sex: 21-Feb-1961 (60 y.o. M) Treating RN: Carlene Coria Primary Care Provider: Halina Maidens Other Clinician: Referring Provider: Halina Maidens Treating Provider/Extender: Jeri Cos Weeks in Treatment: 53 Constitutional Well-nourished and well-hydrated in no acute distress. Respiratory normal breathing without difficulty. Psychiatric this patient is able to make decisions and demonstrates good insight into disease process. Alert and Oriented x 3. pleasant and cooperative. Notes Upon inspection patient's wound bed actually showed signs of good granulation epithelization at this point. Fortunately there does not appear to be any evidence of infection which is great and overall  I am extremely pleased with where we stand at this point. No fevers, chills, nausea, vomiting, or diarrhea. Electronic Signature(s) Signed: 08/20/2021 3:32:28 PM By: Worthy Keeler PA-C Entered By: Worthy Keeler on 08/20/2021 15:32:28 [name] ([id number]) -------------------------------------------------------------------------------- Physician Orders Details Patient Name: [name] Date of Service: 08/20/2021 3:00 PM Medical Record Number: [id number] Patient Account Number: [id number] Date of Birth/Sex: November 26, 1961 (60 y.o. M) Treating RN: Carlene Coria Primary Care Provider: Halina Maidens Other Clinician: Referring Provider: Halina Maidens Treating Provider/Extender: Skipper Cliche in Treatment: 9 Verbal / Phone Orders: No Diagnosis Coding ICD-10 Coding Code Description [id number] Laceration without foreign body, right ankle, initial encounter L97.312 Non-pressure chronic ulcer of right ankle with fat layer exposed I10 Essential (primary) hypertension Follow-up Appointments o Return Appointment in 2 weeks. Bathing/ Shower/ Hygiene o May shower; gently cleanse wound with antibacterial soap, rinse and pat dry prior to dressing wounds Edema Control - Lymphedema / Segmental Compressive Device / Other o Elevate, Exercise Daily and Avoid Standing for Long Periods of Time. o Elevate legs to the level of the heart and pump ankles as often as possible o Elevate leg(s) parallel to the floor when sitting. Wound Treatment Wound #1 - Ankle Wound Laterality: Right, Lateral Cleanser: Soap and Water 3 x Per Week/30 Days Discharge Instructions: Gently cleanse wound with antibacterial soap, rinse and pat dry prior to dressing wounds Primary Dressing: Prisma 4.34 (in) (Generic) 3 x Per Week/30 Days Discharge Instructions: Moisten w/normal saline or sterile water; Cover wound as directed. Do not remove from wound bed. Secondary Dressing: Foam Dressing,  4x4 (in/in) 3  x Per Week/30 Days Secondary Dressing: Kerlix 4.5 x 4.1 (in/yd) 3 x Per Week/30 Days Discharge Instructions: Apply Kerlix 4.5 x 4.1 (in/yd) as instructed Secondary Dressing: coban 3 x Per Week/30 Days Electronic Signature(s) Signed: 08/20/2021 3:53:32 PM By: Worthy Keeler PA-C Signed: 08/23/2021 1:47:40 PM By: Carlene Coria RN Entered By: Carlene Coria on 08/20/2021 15:37:25 [name] ([id number]) -------------------------------------------------------------------------------- Problem List Details Patient Name: [name] Date of Service: 08/20/2021 3:00 PM Medical Record Number: [id number] Patient Account Number: [id number] Date of Birth/Sex: 01-Jan-1961 (60 y.o. M) Treating RN: Carlene Coria Primary Care Provider: Halina Maidens Other Clinician: Referring Provider: Halina Maidens Treating Provider/Extender: Skipper Cliche in Treatment: 9 Active Problems ICD-10 Encounter Code Description Active Date MDM Diagnosis S91.011A Laceration without foreign body, right ankle, initial encounter 06/18/2021 No Yes L97.312 Non-pressure chronic ulcer of right ankle with fat layer exposed 06/18/2021 No Yes I10 Essential (primary) hypertension 06/18/2021 No Yes Inactive Problems Resolved Problems Electronic Signature(s) Signed: 08/20/2021 2:51:11 PM By: Worthy Keeler PA-C Entered By: Worthy Keeler on 08/20/2021 14:51:11 [name] ([id number]) -------------------------------------------------------------------------------- Progress Note Details Patient Name: [name] Date of Service: 08/20/2021 3:00 PM Medical Record Number: [id number] Patient Account Number: [id number] Date of Birth/Sex: [birth year] (60 y.o. M) Treating RN: Carlene Coria Primary Care Provider: Halina Maidens Other Clinician: Referring Provider: Halina Maidens Treating Provider/Extender: Skipper Cliche in Treatment: 9 Subjective Chief Complaint Information obtained from Patient Right  ankle laceration History of Present Illness (HPI) 06/14/2021 upon evaluation today patient appears to be doing somewhat poorly in regard to the wound on his right ankle region. This was an injury that apparently occurred at work according to what he is telling me today. He is seen with his wife in the office at this point. Nonetheless the patient appears to have a circular opening with necrotic tissue in the base of the wound. This does not appear to be doing nearly as good as I would hope. Fortunately there does not appear to be any signs of infection though he does have a significant amount of necrotic tissue this is going to take some time to heal. He also has previously had surgery in this area there is a lot of scar tissue that will again complicate things and extended healing process. The patient does have hypertension otherwise no major medical problems. 06/25/2021 upon evaluation today patient presents for reevaluation here in our clinic he actually does seem to be doing better in regard to his wound on the right lateral ankle. He has been tolerating the dressing changes without complication. Fortunately I do not see any signs of infection which is great news. 07/02/2021 upon evaluation today patient appears to be doing about the same in regard to his wound. With that being said I do believe that this is cleaner there was a little bit of slough noted we have been using Iodoflex. I was able to see a little bit more granulation tissue starting to try to build up but I think the scar tissue here is going to be the biggest slowing factor with regard to healing. That was discussed with the patient and his wife today. Nonetheless I think we want to switch up things based on what I see at this point. 07/09/2021 upon evaluation today patient appears to be doing about the same in regard to his ankle ulcer. He has been tolerating the dressing changes without complication. Fortunately there does not appear to  be any signs  of active infection. No fevers, chills, nausea, vomiting, or diarrhea. 07/16/2021 upon evaluation today patient appears to be doing better to some degree in regard to his wound on ankle region. He actually showed some signs of new granulation growth which is actually a good thing. Fortunately there does not appear to be any evidence of active infection and wound did not appear to be too dry upon evaluation today which is also good news. No fevers, chills, nausea, vomiting, or diarrhea. 07/23/2021 upon evaluation today patient appears to be doing a little better in regard to his wound. He has been tolerating the dressing changes without complication. Fortunately there does not appear to be any signs of active infection which is great news. No fever chills noted we are still waiting on the results back from the TheraSkin investigation to see whether or not he qualifies for this treatment option. 07/30/2021 upon evaluation today patient appears to be doing a little better again in regard to his wound. He is showing signs of improvement which is great news. I am very pleased with where things stand. I do believe that honestly all things considered he is making good progress but were just not able to do anything as far as advanced modalities to speed this up as insurance has literally devout denied everything including a snap VAC, Apligraf, PuraPly, and TheraSkin. Again I am not really certain what else to do otherwise other than just try to continue along the path we are as far as getting this wound to heal. 08/06/2021 upon evaluation today patient appears to be doing well currently he is of peering to make progress slowly but nonetheless this is at least making progress at this point. There does not appear to be any signs of active infection at this time. No fevers, chills, nausea, vomiting, or diarrhea. With that being said I do think that we are headed in the right direction with what I am  seeing currently. I do wish we could do the snap VAC but to be honest I think this is the best we can do with the collagen as it stands currently. 08/13/2021 upon evaluation today patient unfortunately appears to be doing a little bit more poorly in regard to the fact that he is having some pain on the side of his ankle. This does have me a little bit concerned this is on the edge of the wound. There is a very hard area under this that I presume to be more of a calcification although I cannot be certain this is not just a superficial bony protrusion. I do feel it in the base of the wound but again I assumed more calcification due to inflammation from prior surgery but again that may not actually be the case. I do think that we probably need to go forward with an x-ray at this point to see if there is anything going on that could be a more significant issue here. Depending on the results of the x-ray we may be looking at an MRI as well. 08/17/2021 upon evaluation today patient appears to be doing well with regard to his ankle ulcer. This actually is showing some signs of improvement which is great news and overall very pleased in that regard. Fortunately there does not appear to be any signs of active infection which is great news as well. No fevers, chills, nausea, vomiting, or diarrhea. With that being said the x-ray showed some surgical abnormalities and along the lateral ankle as well there was  some mention of an irregularity. With that being said there was nothing specifically to indicate osteomyelitis as a probable diagnosis here. With that being said I did discuss this with the patient and his wife today their opinion is to hold off on an MRI at this point especially since his wound seems to be showing signs of healing. Fortunately there is no evidence of active infection systemically at this point. [name], [name] ([id number]) Objective Constitutional Well-nourished and well-hydrated in no  acute distress. Vitals Time Taken: 2:54 PM, Height: 66 in, Weight: 161 lbs, BMI: 26, Temperature: 98.2 F, Pulse: 67 bpm, Respiratory Rate: 18 breaths/min, Blood Pressure: 136/77 mmHg. Respiratory normal breathing without difficulty. Psychiatric this patient is able to make decisions and demonstrates good insight into disease process. Alert and Oriented x 3. pleasant and cooperative. General Notes: Upon inspection patient's wound bed actually showed signs of good granulation epithelization at this point. Fortunately there does not appear to be any evidence of infection which is great and overall I am extremely pleased with where we stand at this point. No fevers, chills, nausea, vomiting, or diarrhea. Integumentary (Hair, Skin) Wound #1 status is Open. Original cause of wound was Trauma. The date acquired was: 05/09/2021. The wound has been in treatment 9 weeks. The wound is located on the Right,Lateral Ankle. The wound measures 1.1cm length x 2cm width x 0.5cm depth; 1.728cm^2 area and 0.864cm^3 volume. There is Fat Layer (Subcutaneous Tissue) exposed. There is no tunneling or undermining noted. There is a medium amount of serosanguineous drainage noted. There is medium (34-66%) red, pink granulation within the wound bed. There is a medium (34-66%) amount of necrotic tissue within the wound bed including Adherent Slough. Assessment Active Problems ICD-10 Laceration without foreign body, right ankle, initial encounter Non-pressure chronic ulcer of right ankle with fat layer exposed Essential (primary) hypertension Procedures Wound #1 Pre-procedure diagnosis of Wound #1 is a Trauma, Other located on the Right,Lateral Ankle . There was a Excisional Skin/Subcutaneous Tissue Debridement with a total area of 2.2 sq cm performed by Tommie Sams., PA-C. With the following instrument(s): Curette to remove Viable and Non-Viable tissue/material. Material removed includes Subcutaneous Tissue, Skin:  Dermis, and Skin: Epidermis after achieving pain control using Lidocaine 4% Topical Solution. No specimens were taken. A time out was conducted at 15:20, prior to the start of the procedure. A Minimum amount of bleeding was controlled with Pressure. The procedure was tolerated well with a pain level of 0 throughout and a pain level of 0 following the procedure. Post Debridement Measurements: 1.1cm length x 2cm width x 0.5cm depth; 0.864cm^3 volume. Character of Wound/Ulcer Post Debridement is improved. Post procedure Diagnosis Wound #1: Same as Pre-Procedure Plan Follow-up Appointments: Return Appointment in 2 weeks. Bathing/ Shower/ Hygiene: May shower; gently cleanse wound with antibacterial soap, rinse and pat dry prior to dressing wounds Edema Control - Lymphedema / Segmental Compressive Device / Other: Elevate, Exercise Daily and Avoid Standing for Long Periods of Time. Elevate legs to the level of the heart and pump ankles as often as possible Elevate leg(s) parallel to the floor when sitting. [name], [name] ([id number]) WOUND #1: - Ankle Wound Laterality: Right, Lateral Cleanser: Soap and Water 3 x Per Week/30 Days Discharge Instructions: Gently cleanse wound with antibacterial soap, rinse and pat dry prior to dressing wounds Primary Dressing: Prisma 4.34 (in) (Generic) 3 x Per Week/30 Days Discharge Instructions: Moisten w/normal saline or sterile water; Cover wound as directed. Do not remove from wound bed.  Secondary Dressing: Gauze 3 x Per Week/30 Days Discharge Instructions: As directed: dry, moistened with saline or moistened with Dakins Solution Secondary Dressing: coban 3 x Per Week/30 Days 1. Would recommend currently that we going continue with the wound care measures and the patient is in agreement the plan this includes the collagen followed by piece of foam and behind. 2. I am also can recommend that we continue with the roll gauze to secure in place followed by  Coban. 3. With regard to MRI the patient and his wife opted to hold off on proceeding with MRI at this point. Obviously if he changes his mind we can always order that. With that being said this is mainly due to the fact that he is healing but they feel like this is not necessary. We will see patient back for reevaluation in 2 weeks here in the clinic. If anything worsens or changes patient will contact our office for additional recommendations. Electronic Signature(s) Signed: 08/20/2021 3:34:26 PM By: Worthy Keeler PA-C Entered By: Worthy Keeler on 08/20/2021 15:34:26 [name] ([id number]) -------------------------------------------------------------------------------- SuperBill Details Patient Name: [name] Date of Service: 08/20/2021 Medical Record Number: [id number] Patient Account Number: [id number] Date of Birth/Sex: 1961/06/26 (60 y.o. 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

## 2021-08-27 ENCOUNTER — Encounter: Payer: 59 | Admitting: Physician Assistant

## 2021-09-03 ENCOUNTER — Encounter: Payer: 59 | Attending: Physician Assistant | Admitting: Physician Assistant

## 2021-09-03 ENCOUNTER — Other Ambulatory Visit: Payer: Self-pay

## 2021-09-03 DIAGNOSIS — X58XXXA Exposure to other specified factors, initial encounter: Secondary | ICD-10-CM | POA: Insufficient documentation

## 2021-09-03 DIAGNOSIS — L97312 Non-pressure chronic ulcer of right ankle with fat layer exposed: Secondary | ICD-10-CM | POA: Diagnosis not present

## 2021-09-03 DIAGNOSIS — I10 Essential (primary) hypertension: Secondary | ICD-10-CM | POA: Insufficient documentation

## 2021-09-03 DIAGNOSIS — S91011A Laceration without foreign body, right ankle, initial encounter: Secondary | ICD-10-CM | POA: Insufficient documentation

## 2021-09-03 NOTE — Progress Notes (Addendum)
LEVI, MULBERRY ([id number]) Visit Report for 09/03/2021 Arrival Information Details Patient Name: [name], [name] Date of Service: 09/03/2021 3:45 PM Medical Record Number: [id number] Patient Account Number: [id number] Date of Birth/Sex: 11/02/1961 (60 y.o. M) Treating RN: Carlene Coria Primary Care Jaiyla Granados: Halina Maidens Other Clinician: Referring Dylin Ihnen: Halina Maidens Treating Jamileth Putzier/Extender: Skipper Cliche in Treatment: 11 Visit Information History Since Last Visit All ordered tests and consults were completed: No Patient Arrived: Ambulatory Added or deleted any medications: No Arrival Time: 15:42 Any new allergies or adverse reactions: No Accompanied By: self Had a fall or experienced change in No Transfer Assistance: None activities of daily living that may affect Patient Identification Verified: Yes risk of falls: Secondary Verification Process Completed: Yes Signs or symptoms of abuse/neglect since last visito No Patient Requires Transmission-Based Precautions: No Hospitalized since last visit: No Patient Has Alerts: Yes Implantable device outside of the clinic excluding No Patient Alerts: NOT diabetic cellular tissue based products placed in the center since last visit: Has Dressing in Place as Prescribed: Yes Pain Present Now: No Electronic Signature(s) Signed: 09/08/2021 7:54:12 AM By: Carlene Coria RN Entered By: Carlene Coria on 09/03/2021 15:42:32 [name] ([id number]) -------------------------------------------------------------------------------- Clinic Level of Care Assessment Details Patient Name: [name] Date of Service: 09/03/2021 3:45 PM Medical Record Number: [id number] Patient Account Number: [id number] Date of Birth/Sex: 03-03-1961 (60 y.o. M) Treating RN: Carlene Coria Primary Care Ondine Gemme: Halina Maidens Other Clinician: Referring Kristl Morioka: Halina Maidens Treating Chava Dulac/Extender: Skipper Cliche in Treatment:  11 Clinic Level of Care Assessment Items TOOL 4 Quantity Score X - Use when only an EandM is performed on FOLLOW-UP visit 1 0 ASSESSMENTS - Nursing Assessment / Reassessment X - Reassessment of Co-morbidities (includes updates in patient status) 1 10 X- 1 5 Reassessment of Adherence to Treatment Plan ASSESSMENTS - Wound and Skin Assessment / Reassessment X - Simple Wound Assessment / Reassessment - one wound 1 5 '[]'$  - 0 Complex Wound Assessment / Reassessment - multiple wounds '[]'$  - 0 Dermatologic / Skin Assessment (not related to wound area) ASSESSMENTS - Focused Assessment '[]'$  - Circumferential Edema Measurements - multi extremities 0 '[]'$  - 0 Nutritional Assessment / Counseling / Intervention '[]'$  - 0 Lower Extremity Assessment (monofilament, tuning fork, pulses) '[]'$  - 0 Peripheral Arterial Disease Assessment (using hand held doppler) ASSESSMENTS - Ostomy and/or Continence Assessment and Care '[]'$  - Incontinence Assessment and Management 0 '[]'$  - 0 Ostomy Care Assessment and Management (repouching, etc.) PROCESS - Coordination of Care X - Simple Patient / Family Education for ongoing care 1 15 '[]'$  - 0 Complex (extensive) Patient / Family Education for ongoing care '[]'$  - 0 Staff obtains Programmer, systems, Records, Test Results / Process Orders '[]'$  - 0 Staff telephones HHA, Nursing Homes / Clarify orders / etc '[]'$  - 0 Routine Transfer to another Facility (non-emergent condition) '[]'$  - 0 Routine Hospital Admission (non-emergent condition) '[]'$  - 0 New Admissions / Biomedical engineer / Ordering NPWT, Apligraf, etc. '[]'$  - 0 Emergency Hospital Admission (emergent condition) X- 1 10 Simple Discharge Coordination '[]'$  - 0 Complex (extensive) Discharge Coordination PROCESS - Special Needs '[]'$  - Pediatric / Minor Patient Management 0 '[]'$  - 0 Isolation Patient Management '[]'$  - 0 Hearing / Language / Visual special needs '[]'$  - 0 Assessment of Community assistance (transportation, D/C planning,  etc.) '[]'$  - 0 Additional assistance / Altered mentation '[]'$  - 0 Support Surface(s) Assessment (bed, cushion, seat, etc.) INTERVENTIONS - Wound Cleansing / Measurement GARRET, MERE. ([id number]) X- 1 5  Simple Wound Cleansing - one wound '[]'$  - 0 Complex Wound Cleansing - multiple wounds X- 1 5 Wound Imaging (photographs - any number of wounds) '[]'$  - 0 Wound Tracing (instead of photographs) X- 1 5 Simple Wound Measurement - one wound '[]'$  - 0 Complex Wound Measurement - multiple wounds INTERVENTIONS - Wound Dressings X - Small Wound Dressing one or multiple wounds 1 10 '[]'$  - 0 Medium Wound Dressing one or multiple wounds '[]'$  - 0 Large Wound Dressing one or multiple wounds '[]'$  - 0 Application of Medications - topical '[]'$  - 0 Application of Medications - injection INTERVENTIONS - Miscellaneous '[]'$  - External ear exam 0 '[]'$  - 0 Specimen Collection (cultures, biopsies, blood, body fluids, etc.) '[]'$  - 0 Specimen(s) / Culture(s) sent or taken to Lab for analysis '[]'$  - 0 Patient Transfer (multiple staff / Civil Service fast streamer / Similar devices) '[]'$  - 0 Simple Staple / Suture removal (25 or less) '[]'$  - 0 Complex Staple / Suture removal (26 or more) '[]'$  - 0 Hypo / Hyperglycemic Management (close monitor of Blood Glucose) '[]'$  - 0 Ankle / Brachial Index (ABI) - do not check if billed separately X- 1 5 Vital Signs Has the patient been seen at the hospital within the last three years: Yes Total Score: 75 Level Of Care: New/Established - Level 2 Electronic Signature(s) Signed: 09/08/2021 7:54:12 AM By: Carlene Coria RN Entered By: Carlene Coria on 09/03/2021 15:53:14 [name] ([id number]) -------------------------------------------------------------------------------- Encounter Discharge Information Details Patient Name: [name] Date of Service: 09/03/2021 3:45 PM Medical Record Number: [id number] Patient Account Number: [id number] Date of Birth/Sex: 06/20/1961 (60 y.o. M) Treating  RN: Carlene Coria Primary Care Kianah Harries: Halina Maidens Other Clinician: Referring Dontaye Hur: Halina Maidens Treating Jonanthan Bolender/Extender: Skipper Cliche in Treatment: 11 Encounter Discharge Information Items Discharge Condition: Stable Ambulatory Status: Ambulatory Discharge Destination: Home Transportation: Private Auto Accompanied By: self Schedule Follow-up Appointment: Yes Clinical Summary of Care: Patient Declined Electronic Signature(s) Signed: 09/08/2021 7:54:12 AM By: Carlene Coria RN Entered By: Carlene Coria on 09/03/2021 15:54:23 [name] ([id number]) -------------------------------------------------------------------------------- Lower Extremity Assessment Details Patient Name: [name] Date of Service: 09/03/2021 3:45 PM Medical Record Number: [id number] Patient Account Number: [id number] Date of Birth/Sex: 12/08/1961 (60 y.o. M) Treating RN: Carlene Coria Primary Care Oceanna Arruda: Halina Maidens Other Clinician: Referring Megin Consalvo: Halina Maidens Treating Ahmed Inniss/Extender: Jeri Cos Weeks in Treatment: 11 Vascular Assessment Pulses: Dorsalis Pedis Palpable: [Right:Yes] Electronic Signature(s) Signed: 09/08/2021 7:54:12 AM By: Carlene Coria RN Entered By: Carlene Coria on 09/03/2021 15:50:19 [name] ([id number]) -------------------------------------------------------------------------------- Multi Wound Chart Details Patient Name: [name] Date of Service: 09/03/2021 3:45 PM Medical Record Number: [id number] Patient Account Number: [id number] Date of Birth/Sex: [birth year] (60 y.o. M) Treating RN: Carlene Coria Primary Care Chinara Hertzberg: Halina Maidens Other Clinician: Referring Winter Trefz: Halina Maidens Treating Kennen Stammer/Extender: Skipper Cliche in Treatment: 11 Vital Signs Height(in): 66 Pulse(bpm): 78 Weight(lbs): 161 Blood Pressure(mmHg): 153/81 Body Mass Index(BMI): 26 Temperature(F): 98.3 Respiratory  Rate(breaths/min): 18 Photos: [N/A:N/A] Wound Location: Right, Lateral Ankle N/A N/A Wounding Event: Trauma N/A N/A Primary Etiology: Trauma, Other N/A N/A Comorbid History: Hypertension N/A N/A Date Acquired: 05/09/2021 N/A N/A Weeks of Treatment: 11 N/A N/A Wound Status: Open N/A N/A Measurements L x W x D (cm) 1x1.8x0.5 N/A N/A Area (cm) : 1.414 N/A N/A Volume (cm) : 0.707 N/A N/A % Reduction in Area: -80.10% N/A N/A % Reduction in Volume: -79.90% N/A N/A Classification: Full Thickness Without Exposed N/A N/A Support Structures Exudate Amount: Medium N/A  N/A Exudate Type: Serosanguineous N/A N/A Exudate Color: red, brown N/A N/A Granulation Amount: Medium (34-66%) N/A N/A Granulation Quality: Red, Pink N/A N/A Necrotic Amount: Medium (34-66%) N/A N/A Exposed Structures: Fat Layer (Subcutaneous Tissue): N/A N/A Yes Fascia: No Tendon: No Muscle: No Joint: No Bone: No Epithelialization: None N/A N/A Treatment Notes Electronic Signature(s) Signed: 09/08/2021 7:54:12 AM By: Carlene Coria RN Entered By: Carlene Coria on 09/03/2021 15:50:40 [name] ([id number]) -------------------------------------------------------------------------------- Multi-Disciplinary Care Plan Details Patient Name: [name] Date of Service: 09/03/2021 3:45 PM Medical Record Number: [id number] Patient Account Number: [id number] Date of Birth/Sex: April 19, 1961 (60 y.o. M) Treating RN: Carlene Coria Primary Care Blannie Shedlock: Halina Maidens Other Clinician: Referring Elesia Pemberton: Halina Maidens Treating Gissele Narducci/Extender: Skipper Cliche in Treatment: 11 Active Inactive Wound/Skin Impairment Nursing Diagnoses: Knowledge deficit related to ulceration/compromised skin integrity Goals: Patient/caregiver will verbalize understanding of skin care regimen Date Initiated: 06/18/2021 Target Resolution Date: 09/18/2021 Goal Status: Active Ulcer/skin breakdown will have a volume reduction of 30%  by week 4 Date Initiated: 06/18/2021 Date Inactivated: 07/16/2021 Target Resolution Date: 07/18/2021 Goal Status: Unmet Unmet Reason: comorbities Ulcer/skin breakdown will have a volume reduction of 50% by week 8 Date Initiated: 06/18/2021 Date Inactivated: 08/20/2021 Target Resolution Date: 08/18/2021 Goal Status: Unmet Unmet Reason: comorbites Ulcer/skin breakdown will have a volume reduction of 80% by week 12 Date Initiated: 06/18/2021 Target Resolution Date: 09/18/2021 Goal Status: Active Ulcer/skin breakdown will heal within 14 weeks Date Initiated: 06/18/2021 Target Resolution Date: 10/18/2021 Goal Status: Active Interventions: Assess patient/caregiver ability to obtain necessary supplies Assess patient/caregiver ability to perform ulcer/skin care regimen upon admission and as needed Assess ulceration(s) every visit Notes: Electronic Signature(s) Signed: 09/08/2021 7:54:12 AM By: Carlene Coria RN Entered By: Carlene Coria on 09/03/2021 15:50:25 [name] ([id number]) -------------------------------------------------------------------------------- Pain Assessment Details Patient Name: [name] Date of Service: 09/03/2021 3:45 PM Medical Record Number: [id number] Patient Account Number: [id number] Date of Birth/Sex: 04-20-1961 (60 y.o. M) Treating RN: Carlene Coria Primary Care Azari Janssens: Halina Maidens Other Clinician: Referring Parks Czajkowski: Halina Maidens Treating Jakhari Space/Extender: Skipper Cliche in Treatment: 11 Active Problems Location of Pain Severity and Description of Pain Patient Has Paino No Site Locations Pain Management and Medication Current Pain Management: Electronic Signature(s) Signed: 09/08/2021 7:54:12 AM By: Carlene Coria RN Entered By: Carlene Coria on 09/03/2021 15:42:59 [name] ([id number]) -------------------------------------------------------------------------------- Patient/Caregiver Education Details Patient Name: [name] Date of Service: 09/03/2021 3:45 PM Medical Record Number: [id number] Patient Account Number: [id number] Date of Birth/Gender: 1961/06/01 (60 y.o. M) Treating RN: Carlene Coria Primary Care Physician: Halina Maidens Other Clinician: Referring Physician: Halina Maidens Treating Physician/Extender: Skipper Cliche in Treatment: 11 Education Assessment Education Provided To: Patient Education Topics Provided Wound/Skin Impairment: Methods: Explain/Verbal Responses: State content correctly Electronic Signature(s) Signed: 09/08/2021 7:54:12 AM By: Carlene Coria RN Entered By: Carlene Coria on 09/03/2021 15:53:29 [name] ([id number]) -------------------------------------------------------------------------------- Wound Assessment Details Patient Name: [name] Date of Service: 09/03/2021 3:45 PM Medical Record Number: [id number] Patient Account Number: [id number] Date of Birth/Sex: 07-02-1961 (60 y.o. M) Treating RN: Carlene Coria Primary Care Valia Wingard: Halina Maidens Other Clinician: Referring Royalty Domagala: Halina Maidens Treating Eliyanah Elgersma/Extender: Jeri Cos Weeks in Treatment: 11 Wound Status Wound Number: 1 Primary Etiology: Trauma, Other Wound Location: Right, Lateral Ankle Wound Status: Open Wounding Event: Trauma Comorbid History: Hypertension Date Acquired: 05/09/2021 Weeks Of Treatment: 11 Clustered Wound: No Photos Wound Measurements Length: (cm) 1 Width: (cm) 1.8 Depth: (cm) 0.5 Area: (cm) 1.414 Volume: (cm) 0.707 % Reduction  in Area: -80.1% % Reduction in Volume: -79.9% Epithelialization: None Tunneling: No Undermining: No Wound Description Classification: Full Thickness Without Exposed Support Structures Exudate Amount: Medium Exudate Type: Serosanguineous Exudate Color: red, brown Foul Odor After Cleansing: No Slough/Fibrino Yes Wound Bed Granulation Amount: Medium (34-66%) Exposed Structure Granulation Quality: Red,  Pink Fascia Exposed: No Necrotic Amount: Medium (34-66%) Fat Layer (Subcutaneous Tissue) Exposed: Yes Necrotic Quality: Adherent Slough Tendon Exposed: No Muscle Exposed: No Joint Exposed: No Bone Exposed: No Treatment Notes Wound #1 (Ankle) Wound Laterality: Right, Lateral Cleanser Soap and Water Discharge Instruction: Gently cleanse wound with antibacterial soap, rinse and pat dry prior to dressing wounds Peri-Wound Care JOHNELL, [place] ([id number]) Topical Primary Dressing Prisma 4.34 (in) Discharge Instruction: Moisten w/normal saline or sterile water; Cover wound as directed. Do not remove from wound bed. Secondary Dressing Foam Dressing, 4x4 (in/in) Kerlix 4.5 x 4.1 (in/yd) Discharge Instruction: Apply Kerlix 4.5 x 4.1 (in/yd) as instructed coban Secured With Compression Wrap Compression Stockings Add-Ons Electronic Signature(s) Signed: 09/08/2021 7:54:12 AM By: Carlene Coria RN Entered By: Carlene Coria on 09/03/2021 15:47:11 [name] ([id number]) -------------------------------------------------------------------------------- [place] Details Patient Name: [name] Date of Service: 09/03/2021 3:45 PM Medical Record Number: [id number] Patient Account Number: [id number] Date of Birth/Sex: June 13, 1961 (60 y.o. M) Treating RN: Carlene Coria Primary Care Huan Pollok: Halina Maidens Other Clinician: Referring Lindi Abram: Halina Maidens Treating Hollyann Pablo/Extender: Skipper Cliche in Treatment: 11 Vital Signs Time Taken: 15:42 Temperature (F): 98.3 Height (in): 66 Pulse (bpm): 81 Weight (lbs): 161 Respiratory Rate (breaths/min): 18 Body Mass Index (BMI): 26 Blood Pressure (mmHg): 153/81 Reference Range: 80 - 120 mg / dl Electronic Signature(s) Signed: 09/08/2021 7:54:12 AM By: Carlene Coria RN Entered By: Carlene Coria on 09/03/2021 15:42:50

## 2021-09-03 NOTE — Progress Notes (Addendum)
JOHNSTON, HOMAN ([id number]) Visit Report for 09/03/2021 Chief Complaint Document Details Patient Name: [name], [name] Date of Service: 09/03/2021 3:45 PM Medical Record Number: [id number] Patient Account Number: [id number] Date of Birth/Sex: October 19, 1961 (60 y.o. M) Treating RN: Carlene Coria Primary Care Provider: Halina Maidens Other Clinician: Referring Provider: Halina Maidens Treating Provider/Extender: Skipper Cliche in Treatment: 11 Information Obtained from: Patient Chief Complaint Right ankle laceration Electronic Signature(s) Signed: 09/03/2021 3:39:18 PM By: Worthy Keeler PA-C Entered By: Worthy Keeler on 09/03/2021 15:39:18 [name] ([id number]) -------------------------------------------------------------------------------- HPI Details Patient Name: [name] Date of Service: 09/03/2021 3:45 PM Medical Record Number: [id number] Patient Account Number: [id number] Date of Birth/Sex: 1961-08-13 (60 y.o. M) Treating RN: Carlene Coria Primary Care Provider: Halina Maidens Other Clinician: Referring Provider: Halina Maidens Treating Provider/Extender: Skipper Cliche in Treatment: 11 History of Present Illness HPI Description: 06/14/2021 upon evaluation today patient appears to be doing somewhat poorly in regard to the wound on his right ankle region. This was an injury that apparently occurred at work according to what he is telling me today. He is seen with his wife in the office at this point. Nonetheless the patient appears to have a circular opening with necrotic tissue in the base of the wound. This does not appear to be doing nearly as good as I would hope. Fortunately there does not appear to be any signs of infection though he does have a significant amount of necrotic tissue this is going to take some time to heal. He also has previously had surgery in this area there is a lot of scar tissue that will again complicate things and extended  healing process. The patient does have hypertension otherwise no major medical problems. 06/25/2021 upon evaluation today patient presents for reevaluation here in our clinic he actually does seem to be doing better in regard to his wound on the right lateral ankle. He has been tolerating the dressing changes without complication. Fortunately I do not see any signs of infection which is great news. 07/02/2021 upon evaluation today patient appears to be doing about the same in regard to his wound. With that being said I do believe that this is cleaner there was a little bit of slough noted we have been using Iodoflex. I was able to see a little bit more granulation tissue starting to try to build up but I think the scar tissue here is going to be the biggest slowing factor with regard to healing. That was discussed with the patient and his wife today. Nonetheless I think we want to switch up things based on what I see at this point. 07/09/2021 upon evaluation today patient appears to be doing about the same in regard to his ankle ulcer. He has been tolerating the dressing changes without complication. Fortunately there does not appear to be any signs of active infection. No fevers, chills, nausea, vomiting, or diarrhea. 07/16/2021 upon evaluation today patient appears to be doing better to some degree in regard to his wound on ankle region. He actually showed some signs of new granulation growth which is actually a good thing. Fortunately there does not appear to be any evidence of active infection and wound did not appear to be too dry upon evaluation today which is also good news. No fevers, chills, nausea, vomiting, or diarrhea. 07/23/2021 upon evaluation today patient appears to be doing a little better in regard to his wound. He has been tolerating the dressing changes without complication. Fortunately  there does not appear to be any signs of active infection which is great news. No fever chills noted we  are still waiting on the results back from the TheraSkin investigation to see whether or not he qualifies for this treatment option. 07/30/2021 upon evaluation today patient appears to be doing a little better again in regard to his wound. He is showing signs of improvement which is great news. I am very pleased with where things stand. I do believe that honestly all things considered he is making good progress but were just not able to do anything as far as advanced modalities to speed this up as insurance has literally devout denied everything including a snap VAC, Apligraf, PuraPly, and TheraSkin. Again I am not really certain what else to do otherwise other than just try to continue along the path we are as far as getting this wound to heal. 08/06/2021 upon evaluation today patient appears to be doing well currently he is of peering to make progress slowly but nonetheless this is at least making progress at this point. There does not appear to be any signs of active infection at this time. No fevers, chills, nausea, vomiting, or diarrhea. With that being said I do think that we are headed in the right direction with what I am seeing currently. I do wish we could do the snap VAC but to be honest I think this is the best we can do with the collagen as it stands currently. 08/13/2021 upon evaluation today patient unfortunately appears to be doing a little bit more poorly in regard to the fact that he is having some pain on the side of his ankle. This does have me a little bit concerned this is on the edge of the wound. There is a very hard area under this that I presume to be more of a calcification although I cannot be certain this is not just a superficial bony protrusion. I do feel it in the base of the wound but again I assumed more calcification due to inflammation from prior surgery but again that may not actually be the case. I do think that we probably need to go forward with an x-ray at this  point to see if there is anything going on that could be a more significant issue here. Depending on the results of the x-ray we may be looking at an MRI as well. 08/17/2021 upon evaluation today patient appears to be doing well with regard to his ankle ulcer. This actually is showing some signs of improvement which is great news and overall very pleased in that regard. Fortunately there does not appear to be any signs of active infection which is great news as well. No fevers, chills, nausea, vomiting, or diarrhea. With that being said the x-ray showed some surgical abnormalities and along the lateral ankle as well there was some mention of an irregularity. With that being said there was nothing specifically to indicate osteomyelitis as a probable diagnosis here. With that being said I did discuss this with the patient and his wife today their opinion is to hold off on an MRI at this point especially since his wound seems to be showing signs of healing. Fortunately there is no evidence of active infection systemically at this point. 09/03/2021 upon evaluation today patient appears to be doing well with regard to his wound. He has been tolerating the dressing changes without complication. Fortunately there does not appear to be any signs of active infection which  is also great news. In general I am very happy with where we stand. Electronic Signature(s) Signed: 09/03/2021 3:54:58 PM By: Worthy Keeler PA-C Entered By: Worthy Keeler on 09/03/2021 15:54:58 [name] ([id number] [name] ([id number]) -------------------------------------------------------------------------------- Physical Exam Details Patient Name: [name] Date of Service: 09/03/2021 3:45 PM Medical Record Number: [id number] Patient Account Number: [id number] Date of Birth/Sex: [birth year] (60 y.o. M) Treating RN: Carlene Coria Primary Care Provider: Halina Maidens Other Clinician: Referring Provider:  Halina Maidens Treating Provider/Extender: Jeri Cos Weeks in Treatment: 18 Constitutional Well-nourished and well-hydrated in no acute distress. Respiratory normal breathing without difficulty. Psychiatric this patient is able to make decisions and demonstrates good insight into disease process. Alert and Oriented x 3. pleasant and cooperative. Notes Patient's wound bed showed signs of good granulation epithelization at this point. Fortunately there does not appear to be any evidence of infection which is great and overall I am extremely pleased with where we stand today. No sharp debridement was even necessary as the wound does appear to be doing so well. Electronic Signature(s) Signed: 09/03/2021 3:55:16 PM By: Worthy Keeler PA-C Entered By: Worthy Keeler on 09/03/2021 15:55:16 [name] ([id number]) -------------------------------------------------------------------------------- Physician Orders Details Patient Name: [name] Date of Service: 09/03/2021 3:45 PM Medical Record Number: [id number] Patient Account Number: [id number] Date of Birth/Sex: 09-Jan-1961 (60 y.o. M) Treating RN: Carlene Coria Primary Care Provider: Halina Maidens Other Clinician: Referring Provider: Halina Maidens Treating Provider/Extender: Skipper Cliche in Treatment: 11 Verbal / Phone Orders: No Diagnosis Coding ICD-10 Coding Code Description [id number] Laceration without foreign body, right ankle, initial encounter L97.312 Non-pressure chronic ulcer of right ankle with fat layer exposed I10 Essential (primary) hypertension Follow-up Appointments o Return Appointment in 2 weeks. Bathing/ Shower/ Hygiene o May shower; gently cleanse wound with antibacterial soap, rinse and pat dry prior to dressing wounds Edema Control - Lymphedema / Segmental Compressive Device / Other o Elevate, Exercise Daily and Avoid Standing for Long Periods of Time. o Elevate legs to the level of  the heart and pump ankles as often as possible o Elevate leg(s) parallel to the floor when sitting. Wound Treatment Wound #1 - Ankle Wound Laterality: Right, Lateral Cleanser: Soap and Water 3 x Per Week/30 Days Discharge Instructions: Gently cleanse wound with antibacterial soap, rinse and pat dry prior to dressing wounds Primary Dressing: Prisma 4.34 (in) (Generic) 3 x Per Week/30 Days Discharge Instructions: Moisten w/normal saline or sterile water; Cover wound as directed. Do not remove from wound bed. Secondary Dressing: Foam Dressing, 4x4 (in/in) 3 x Per Week/30 Days Secondary Dressing: Kerlix 4.5 x 4.1 (in/yd) 3 x Per Week/30 Days Discharge Instructions: Apply Kerlix 4.5 x 4.1 (in/yd) as instructed Secondary Dressing: coban 3 x Per Week/30 Days Electronic Signature(s) Signed: 09/03/2021 4:41:08 PM By: Worthy Keeler PA-C Signed: 09/08/2021 7:54:12 AM By: Carlene Coria RN Entered By: Carlene Coria on 09/03/2021 15:52:44 [name] ([id number]) -------------------------------------------------------------------------------- Problem List Details Patient Name: [name] Date of Service: 09/03/2021 3:45 PM Medical Record Number: [id number] Patient Account Number: [id number] Date of Birth/Sex: October 19, 1961 (60 y.o. M) Treating RN: Carlene Coria Primary Care Provider: Halina Maidens Other Clinician: Referring Provider: Halina Maidens Treating Provider/Extender: Skipper Cliche in Treatment: 11 Active Problems ICD-10 Encounter Code Description Active Date MDM Diagnosis S91.011A Laceration without foreign body, right ankle, initial encounter 06/18/2021 No Yes L97.312 Non-pressure chronic ulcer of right ankle with fat layer exposed 06/18/2021 No Yes I10 Essential (primary) hypertension  06/18/2021 No Yes Inactive Problems Resolved Problems Electronic Signature(s) Signed: 09/03/2021 3:38:45 PM By: Worthy Keeler PA-C Entered By: Worthy Keeler on 09/03/2021  15:38:45 [name] ([id number]) -------------------------------------------------------------------------------- Progress Note Details Patient Name: [name] Date of Service: 09/03/2021 3:45 PM Medical Record Number: [id number] Patient Account Number: [id number] Date of Birth/Sex: 08/19/1961 (60 y.o. M) Treating RN: Carlene Coria Primary Care Provider: Halina Maidens Other Clinician: Referring Provider: Halina Maidens Treating Provider/Extender: Skipper Cliche in Treatment: 11 Subjective Chief Complaint Information obtained from Patient Right ankle laceration History of Present Illness (HPI) 06/14/2021 upon evaluation today patient appears to be doing somewhat poorly in regard to the wound on his right ankle region. This was an injury that apparently occurred at work according to what he is telling me today. He is seen with his wife in the office at this point. Nonetheless the patient appears to have a circular opening with necrotic tissue in the base of the wound. This does not appear to be doing nearly as good as I would hope. Fortunately there does not appear to be any signs of infection though he does have a significant amount of necrotic tissue this is going to take some time to heal. He also has previously had surgery in this area there is a lot of scar tissue that will again complicate things and extended healing process. The patient does have hypertension otherwise no major medical problems. 06/25/2021 upon evaluation today patient presents for reevaluation here in our clinic he actually does seem to be doing better in regard to his wound on the right lateral ankle. He has been tolerating the dressing changes without complication. Fortunately I do not see any signs of infection which is great news. 07/02/2021 upon evaluation today patient appears to be doing about the same in regard to his wound. With that being said I do believe that this is cleaner there was a  little bit of slough noted we have been using Iodoflex. I was able to see a little bit more granulation tissue starting to try to build up but I think the scar tissue here is going to be the biggest slowing factor with regard to healing. That was discussed with the patient and his wife today. Nonetheless I think we want to switch up things based on what I see at this point. 07/09/2021 upon evaluation today patient appears to be doing about the same in regard to his ankle ulcer. He has been tolerating the dressing changes without complication. Fortunately there does not appear to be any signs of active infection. No fevers, chills, nausea, vomiting, or diarrhea. 07/16/2021 upon evaluation today patient appears to be doing better to some degree in regard to his wound on ankle region. He actually showed some signs of new granulation growth which is actually a good thing. Fortunately there does not appear to be any evidence of active infection and wound did not appear to be too dry upon evaluation today which is also good news. No fevers, chills, nausea, vomiting, or diarrhea. 07/23/2021 upon evaluation today patient appears to be doing a little better in regard to his wound. He has been tolerating the dressing changes without complication. Fortunately there does not appear to be any signs of active infection which is great news. No fever chills noted we are still waiting on the results back from the TheraSkin investigation to see whether or not he qualifies for this treatment option. 07/30/2021 upon evaluation today patient appears to be doing a  little better again in regard to his wound. He is showing signs of improvement which is great news. I am very pleased with where things stand. I do believe that honestly all things considered he is making good progress but were just not able to do anything as far as advanced modalities to speed this up as insurance has literally devout denied everything including  a snap VAC, Apligraf, PuraPly, and TheraSkin. Again I am not really certain what else to do otherwise other than just try to continue along the path we are as far as getting this wound to heal. 08/06/2021 upon evaluation today patient appears to be doing well currently he is of peering to make progress slowly but nonetheless this is at least making progress at this point. There does not appear to be any signs of active infection at this time. No fevers, chills, nausea, vomiting, or diarrhea. With that being said I do think that we are headed in the right direction with what I am seeing currently. I do wish we could do the snap VAC but to be honest I think this is the best we can do with the collagen as it stands currently. 08/13/2021 upon evaluation today patient unfortunately appears to be doing a little bit more poorly in regard to the fact that he is having some pain on the side of his ankle. This does have me a little bit concerned this is on the edge of the wound. There is a very hard area under this that I presume to be more of a calcification although I cannot be certain this is not just a superficial bony protrusion. I do feel it in the base of the wound but again I assumed more calcification due to inflammation from prior surgery but again that may not actually be the case. I do think that we probably need to go forward with an x-ray at this point to see if there is anything going on that could be a more significant issue here. Depending on the results of the x-ray we may be looking at an MRI as well. 08/17/2021 upon evaluation today patient appears to be doing well with regard to his ankle ulcer. This actually is showing some signs of improvement which is great news and overall very pleased in that regard. Fortunately there does not appear to be any signs of active infection which is great news as well. No fevers, chills, nausea, vomiting, or diarrhea. With that being said the x-ray showed some  surgical abnormalities and along the lateral ankle as well there was some mention of an irregularity. With that being said there was nothing specifically to indicate osteomyelitis as a probable diagnosis here. With that being said I did discuss this with the patient and his wife today their opinion is to hold off on an MRI at this point especially since his wound seems to be showing signs of healing. Fortunately there is no evidence of active infection systemically at this point. 09/03/2021 upon evaluation today patient appears to be doing well with regard to his wound. He has been tolerating the dressing changes without complication. Fortunately there does not appear to be any signs of active infection which is also great news. In general I am very happy with where we stand. JAYR, DECASTRO ([id number]) Objective Constitutional Well-nourished and well-hydrated in no acute distress. Vitals Time Taken: 3:42 PM, Height: 66 in, Weight: 161 lbs, BMI: 26, Temperature: 98.3 F, Pulse: 81 bpm, Respiratory Rate: 18 breaths/min, Blood Pressure:  153/81 mmHg. Respiratory normal breathing without difficulty. Psychiatric this patient is able to make decisions and demonstrates good insight into disease process. Alert and Oriented x 3. pleasant and cooperative. General Notes: Patient's wound bed showed signs of good granulation epithelization at this point. Fortunately there does not appear to be any evidence of infection which is great and overall I am extremely pleased with where we stand today. No sharp debridement was even necessary as the wound does appear to be doing so well. Integumentary (Hair, Skin) Wound #1 status is Open. Original cause of wound was Trauma. The date acquired was: 05/09/2021. The wound has been in treatment 11 weeks. The wound is located on the Right,Lateral Ankle. The wound measures 1cm length x 1.8cm width x 0.5cm depth; 1.414cm^2 area and 0.707cm^3 volume. There is Fat Layer  (Subcutaneous Tissue) exposed. There is no tunneling or undermining noted. There is a medium amount of serosanguineous drainage noted. There is medium (34-66%) red, pink granulation within the wound bed. There is a medium (34-66%) amount of necrotic tissue within the wound bed including Adherent Slough. Assessment Active Problems ICD-10 Laceration without foreign body, right ankle, initial encounter Non-pressure chronic ulcer of right ankle with fat layer exposed Essential (primary) hypertension Plan Follow-up Appointments: Return Appointment in 2 weeks. Bathing/ Shower/ Hygiene: May shower; gently cleanse wound with antibacterial soap, rinse and pat dry prior to dressing wounds Edema Control - Lymphedema / Segmental Compressive Device / Other: Elevate, Exercise Daily and Avoid Standing for Long Periods of Time. Elevate legs to the level of the heart and pump ankles as often as possible Elevate leg(s) parallel to the floor when sitting. WOUND #1: - Ankle Wound Laterality: Right, Lateral Cleanser: Soap and Water 3 x Per Week/30 Days Discharge Instructions: Gently cleanse wound with antibacterial soap, rinse and pat dry prior to dressing wounds Primary Dressing: Prisma 4.34 (in) (Generic) 3 x Per Week/30 Days Discharge Instructions: Moisten w/normal saline or sterile water; Cover wound as directed. Do not remove from wound bed. Secondary Dressing: Foam Dressing, 4x4 (in/in) 3 x Per Week/30 Days Secondary Dressing: Kerlix 4.5 x 4.1 (in/yd) 3 x Per Week/30 Days Discharge Instructions: Apply Kerlix 4.5 x 4.1 (in/yd) as instructed Secondary Dressing: coban 3 x Per Week/30 Days 1. Would recommend currently that we going continue with wound care measures as before with the silver collagen I think this is doing an awesome job. OMARR, SARMA ([id number]) 2. I am also can recommend that we have the patient go ahead and continue with the roll gauze to secure in place along with Coban which  helps hold it all in place which I think is done well for him as well. We will see patient back for reevaluation in 2 weeks here in the clinic. If anything worsens or changes patient will contact our office for additional recommendations. Electronic Signature(s) Signed: 09/03/2021 3:55:46 PM By: Worthy Keeler PA-C Entered By: Worthy Keeler on 09/03/2021 15:55:46 [name] ([id number]) -------------------------------------------------------------------------------- SuperBill Details Patient Name: [name] Date of Service: 09/03/2021 Medical Record Number: [id number] Patient Account Number: [id number] Date of Birth/Sex: 14-Mar-1961 (60 y.o. M) Treating RN: Carlene Coria Primary Care Provider: Halina Maidens Other Clinician: Referring Provider: Halina Maidens Treating Provider/Extender: Skipper Cliche in Treatment: 11 Diagnosis Coding ICD-10 Codes Code Description S91.011A Laceration without foreign body, right ankle, initial encounter [id number] Non-pressure chronic ulcer of right ankle with fat layer exposed [place] (primary) hypertension Facility Procedures CPT4 Code: [id number] Description: [phone] - WOUND CARE  VISIT-LEV 2 EST PT Modifier: Quantity: 1 Physician Procedures CPT4 Code: [id number] Description: [id number] - WC PHYS LEVEL 3 - EST PT Modifier: Quantity: 1 CPT4 Code: Description: ICD-10 Diagnosis Description [id number] Laceration without foreign body, right ankle, initial encounter [id number] Non-pressure chronic ulcer of right ankle with fat layer exposed I10 Essential (primary) hypertension Modifier: Quantity: Electronic Signature(s) Signed: 09/03/2021 3:56:55 PM By: Worthy Keeler PA-C Entered By: Worthy Keeler on 09/03/2021 15:56:55

## 2021-09-17 ENCOUNTER — Encounter: Payer: 59 | Admitting: Physician Assistant

## 2021-09-17 ENCOUNTER — Other Ambulatory Visit: Payer: Self-pay

## 2021-09-17 DIAGNOSIS — S91011A Laceration without foreign body, right ankle, initial encounter: Secondary | ICD-10-CM | POA: Diagnosis not present

## 2021-09-17 NOTE — Progress Notes (Addendum)
TANAY, MISURACA ([id number]) Visit Report for 09/17/2021 Chief Complaint Document Details Patient Name: [name], [name]. Date of Service: 09/17/2021 1:15 PM Medical Record Number: [id number] Patient Account Number: [id number] Date of Birth/Sex: 07/10/1961 (60 y.o. M) Treating RN: Cornell Barman Primary Care Provider: Halina Maidens Other Clinician: Referring Provider: Halina Maidens Treating Provider/Extender: Skipper Cliche in Treatment: 13 Information Obtained from: Patient Chief Complaint Right ankle laceration Electronic Signature(s) Signed: 09/17/2021 1:17:36 PM By: Worthy Keeler PA-C Entered By: Worthy Keeler on 09/17/2021 13:17:36 [name] ([id number]) -------------------------------------------------------------------------------- HPI Details Patient Name: [name] Date of Service: 09/17/2021 1:15 PM Medical Record Number: [id number] Patient Account Number: [id number] Date of Birth/Sex: 05/29/1961 (60 y.o. M) Treating RN: Cornell Barman Primary Care Provider: Halina Maidens Other Clinician: Referring Provider: Halina Maidens Treating Provider/Extender: Skipper Cliche in Treatment: 13 History of Present Illness HPI Description: 06/14/2021 upon evaluation today patient appears to be doing somewhat poorly in regard to the wound on his right ankle region. This was an injury that apparently occurred at work according to what he is telling me today. He is seen with his wife in the office at this point. Nonetheless the patient appears to have a circular opening with necrotic tissue in the base of the wound. This does not appear to be doing nearly as good as I would hope. Fortunately there does not appear to be any signs of infection though he does have a significant amount of necrotic tissue this is going to take some time to heal. He also has previously had surgery in this area there is a lot of scar tissue that will again complicate things and extended  healing process. The patient does have hypertension otherwise no major medical problems. 06/25/2021 upon evaluation today patient presents for reevaluation here in our clinic he actually does seem to be doing better in regard to his wound on the right lateral ankle. He has been tolerating the dressing changes without complication. Fortunately I do not see any signs of infection which is great news. 07/02/2021 upon evaluation today patient appears to be doing about the same in regard to his wound. With that being said I do believe that this is cleaner there was a little bit of slough noted we have been using Iodoflex. I was able to see a little bit more granulation tissue starting to try to build up but I think the scar tissue here is going to be the biggest slowing factor with regard to healing. That was discussed with the patient and his wife today. Nonetheless I think we want to switch up things based on what I see at this point. 07/09/2021 upon evaluation today patient appears to be doing about the same in regard to his ankle ulcer. He has been tolerating the dressing changes without complication. Fortunately there does not appear to be any signs of active infection. No fevers, chills, nausea, vomiting, or diarrhea. 07/16/2021 upon evaluation today patient appears to be doing better to some degree in regard to his wound on ankle region. He actually showed some signs of new granulation growth which is actually a good thing. Fortunately there does not appear to be any evidence of active infection and wound did not appear to be too dry upon evaluation today which is also good news. No fevers, chills, nausea, vomiting, or diarrhea. 07/23/2021 upon evaluation today patient appears to be doing a little better in regard to his wound. He has been tolerating the dressing changes without complication. Fortunately  there does not appear to be any signs of active infection which is great news. No fever chills noted we  are still waiting on the results back from the TheraSkin investigation to see whether or not he qualifies for this treatment option. 07/30/2021 upon evaluation today patient appears to be doing a little better again in regard to his wound. He is showing signs of improvement which is great news. I am very pleased with where things stand. I do believe that honestly all things considered he is making good progress but were just not able to do anything as far as advanced modalities to speed this up as insurance has literally devout denied everything including a snap VAC, Apligraf, PuraPly, and TheraSkin. Again I am not really certain what else to do otherwise other than just try to continue along the path we are as far as getting this wound to heal. 08/06/2021 upon evaluation today patient appears to be doing well currently he is of peering to make progress slowly but nonetheless this is at least making progress at this point. There does not appear to be any signs of active infection at this time. No fevers, chills, nausea, vomiting, or diarrhea. With that being said I do think that we are headed in the right direction with what I am seeing currently. I do wish we could do the snap VAC but to be honest I think this is the best we can do with the collagen as it stands currently. 08/13/2021 upon evaluation today patient unfortunately appears to be doing a little bit more poorly in regard to the fact that he is having some pain on the side of his ankle. This does have me a little bit concerned this is on the edge of the wound. There is a very hard area under this that I presume to be more of a calcification although I cannot be certain this is not just a superficial bony protrusion. I do feel it in the base of the wound but again I assumed more calcification due to inflammation from prior surgery but again that may not actually be the case. I do think that we probably need to go forward with an x-ray at this  point to see if there is anything going on that could be a more significant issue here. Depending on the results of the x-ray we may be looking at an MRI as well. 08/17/2021 upon evaluation today patient appears to be doing well with regard to his ankle ulcer. This actually is showing some signs of improvement which is great news and overall very pleased in that regard. Fortunately there does not appear to be any signs of active infection which is great news as well. No fevers, chills, nausea, vomiting, or diarrhea. With that being said the x-ray showed some surgical abnormalities and along the lateral ankle as well there was some mention of an irregularity. With that being said there was nothing specifically to indicate osteomyelitis as a probable diagnosis here. With that being said I did discuss this with the patient and his wife today their opinion is to hold off on an MRI at this point especially since his wound seems to be showing signs of healing. Fortunately there is no evidence of active infection systemically at this point. 09/03/2021 upon evaluation today patient appears to be doing well with regard to his wound. He has been tolerating the dressing changes without complication. Fortunately there does not appear to be any signs of active infection which  is also great news. In general I am very happy with where we stand. 09/17/2021 upon evaluation today patient appears to be doing well currently in regard to his wound. He has been tolerating the dressing changes without complication. Fortunately there does not appear to be any evidence of active infection which is great news. No fevers, chills, nausea, vomiting, or diarrhea. Electronic Signature(s) THAO, VANOVER ([id number]) Signed: 09/17/2021 1:31:52 PM By: Worthy Keeler PA-C Entered By: Worthy Keeler on 09/17/2021 13:31:52 [name]  ([id number]) -------------------------------------------------------------------------------- Physical Exam Details Patient Name: [name] Date of Service: 09/17/2021 1:15 PM Medical Record Number: [id number] Patient Account Number: [id number] Date of Birth/Sex: Jan 28, 1961 (60 y.o. M) Treating RN: Cornell Barman Primary Care Provider: Halina Maidens Other Clinician: Referring Provider: Halina Maidens Treating Provider/Extender: Jeri Cos Weeks in Treatment: 46 Constitutional Well-nourished and well-hydrated in no acute distress. Respiratory normal breathing without difficulty. Psychiatric this patient is able to make decisions and demonstrates good insight into disease process. Alert and Oriented x 3. pleasant and cooperative. Notes Upon inspection patient's wound bed showed signs of good granulation and epithelization at this point. I do not see any evidence whatsoever of active infection which is great news and overall I think that we are definitely showing signs of excellent improvement all of which is great news. I think that this is slow to heal but nonetheless does seem to be making good progress. Electronic Signature(s) Signed: 09/17/2021 1:32:17 PM By: Worthy Keeler PA-C Entered By: Worthy Keeler on 09/17/2021 13:32:16 [name] ([id number]) -------------------------------------------------------------------------------- Physician Orders Details Patient Name: [name] Date of Service: 09/17/2021 1:15 PM Medical Record Number: [id number] Patient Account Number: [id number] Date of Birth/Sex: Oct 26, 1961 (60 y.o. M) Treating RN: Cornell Barman Primary Care Provider: Halina Maidens Other Clinician: Referring Provider: Halina Maidens Treating Provider/Extender: Skipper Cliche in Treatment: 13 Verbal / Phone Orders: No Diagnosis Coding ICD-10 Coding Code Description U20.254Y Laceration without foreign body, right ankle, initial encounter L97.312  Non-pressure chronic ulcer of right ankle with fat layer exposed I10 Essential (primary) hypertension Follow-up Appointments o Return Appointment in 2 weeks. Bathing/ Shower/ Hygiene o May shower; gently cleanse wound with antibacterial soap, rinse and pat dry prior to dressing wounds Edema Control - Lymphedema / Segmental Compressive Device / Other o Elevate, Exercise Daily and Avoid Standing for Long Periods of Time. o Elevate legs to the level of the heart and pump ankles as often as possible o Elevate leg(s) parallel to the floor when sitting. Wound Treatment Wound #1 - Ankle Wound Laterality: Right, Lateral Cleanser: Soap and Water 3 x Per Week/30 Days Discharge Instructions: Gently cleanse wound with antibacterial soap, rinse and pat dry prior to dressing wounds Primary Dressing: Prisma 4.34 (in) (Generic) 3 x Per Week/30 Days Discharge Instructions: Moisten w/normal saline or sterile water; Cover wound as directed. Do not remove from wound bed. Secondary Dressing: Foam Dressing, 4x4 (in/in) 3 x Per Week/30 Days Secondary Dressing: Kerlix 4.5 x 4.1 (in/yd) 3 x Per Week/30 Days Discharge Instructions: Apply Kerlix 4.5 x 4.1 (in/yd) as instructed Secondary Dressing: coban 3 x Per Week/30 Days Electronic Signature(s) Signed: 09/17/2021 4:54:51 PM By: Worthy Keeler PA-C Signed: 09/17/2021 6:05:18 PM By: Gretta Cool, BSN, RN, CWS, Kim RN, BSN Entered By: Gretta Cool, BSN, RN, CWS, Kim on 09/17/2021 13:29:45 [name] ([id number]) -------------------------------------------------------------------------------- Problem List Details Patient Name: [name] Date of Service: 09/17/2021 1:15 PM Medical Record Number: [id number] Patient Account Number: [id number] Date of Birth/Sex:  [birth year] (60 y.o. M) Treating RN: Cornell Barman Primary Care Provider: Halina Maidens Other Clinician: Referring Provider: Halina Maidens Treating Provider/Extender: Skipper Cliche in  Treatment: 13 Active Problems ICD-10 Encounter Code Description Active Date MDM Diagnosis S91.011A Laceration without foreign body, right ankle, initial encounter 06/18/2021 No Yes L97.312 Non-pressure chronic ulcer of right ankle with fat layer exposed 06/18/2021 No Yes I10 Essential (primary) hypertension 06/18/2021 No Yes Inactive Problems Resolved Problems Electronic Signature(s) Signed: 09/17/2021 1:17:14 PM By: Worthy Keeler PA-C Entered By: Worthy Keeler on 09/17/2021 13:17:13 [name] ([id number]) -------------------------------------------------------------------------------- Progress Note Details Patient Name: [name] Date of Service: 09/17/2021 1:15 PM Medical Record Number: [id number] Patient Account Number: [id number] Date of Birth/Sex: 21-Feb-1961 (60 y.o. M) Treating RN: Cornell Barman Primary Care Provider: Halina Maidens Other Clinician: Referring Provider: Halina Maidens Treating Provider/Extender: Skipper Cliche in Treatment: 13 Subjective Chief Complaint Information obtained from Patient Right ankle laceration History of Present Illness (HPI) 06/14/2021 upon evaluation today patient appears to be doing somewhat poorly in regard to the wound on his right ankle region. This was an injury that apparently occurred at work according to what he is telling me today. He is seen with his wife in the office at this point. Nonetheless the patient appears to have a circular opening with necrotic tissue in the base of the wound. This does not appear to be doing nearly as good as I would hope. Fortunately there does not appear to be any signs of infection though he does have a significant amount of necrotic tissue this is going to take some time to heal. He also has previously had surgery in this area there is a lot of scar tissue that will again complicate things and extended healing process. The patient does have hypertension otherwise no major medical  problems. 06/25/2021 upon evaluation today patient presents for reevaluation here in our clinic he actually does seem to be doing better in regard to his wound on the right lateral ankle. He has been tolerating the dressing changes without complication. Fortunately I do not see any signs of infection which is great news. 07/02/2021 upon evaluation today patient appears to be doing about the same in regard to his wound. With that being said I do believe that this is cleaner there was a little bit of slough noted we have been using Iodoflex. I was able to see a little bit more granulation tissue starting to try to build up but I think the scar tissue here is going to be the biggest slowing factor with regard to healing. That was discussed with the patient and his wife today. Nonetheless I think we want to switch up things based on what I see at this point. 07/09/2021 upon evaluation today patient appears to be doing about the same in regard to his ankle ulcer. He has been tolerating the dressing changes without complication. Fortunately there does not appear to be any signs of active infection. No fevers, chills, nausea, vomiting, or diarrhea. 07/16/2021 upon evaluation today patient appears to be doing better to some degree in regard to his wound on ankle region. He actually showed some signs of new granulation growth which is actually a good thing. Fortunately there does not appear to be any evidence of active infection and wound did not appear to be too dry upon evaluation today which is also good news. No fevers, chills, nausea, vomiting, or diarrhea. 07/23/2021 upon evaluation today patient appears to be doing a little better  in regard to his wound. He has been tolerating the dressing changes without complication. Fortunately there does not appear to be any signs of active infection which is great news. No fever chills noted we are still waiting on the results back from the TheraSkin investigation to see  whether or not he qualifies for this treatment option. 07/30/2021 upon evaluation today patient appears to be doing a little better again in regard to his wound. He is showing signs of improvement which is great news. I am very pleased with where things stand. I do believe that honestly all things considered he is making good progress but were just not able to do anything as far as advanced modalities to speed this up as insurance has literally devout denied everything including a snap VAC, Apligraf, PuraPly, and TheraSkin. Again I am not really certain what else to do otherwise other than just try to continue along the path we are as far as getting this wound to heal. 08/06/2021 upon evaluation today patient appears to be doing well currently he is of peering to make progress slowly but nonetheless this is at least making progress at this point. There does not appear to be any signs of active infection at this time. No fevers, chills, nausea, vomiting, or diarrhea. With that being said I do think that we are headed in the right direction with what I am seeing currently. I do wish we could do the snap VAC but to be honest I think this is the best we can do with the collagen as it stands currently. 08/13/2021 upon evaluation today patient unfortunately appears to be doing a little bit more poorly in regard to the fact that he is having some pain on the side of his ankle. This does have me a little bit concerned this is on the edge of the wound. There is a very hard area under this that I presume to be more of a calcification although I cannot be certain this is not just a superficial bony protrusion. I do feel it in the base of the wound but again I assumed more calcification due to inflammation from prior surgery but again that may not actually be the case. I do think that we probably need to go forward with an x-ray at this point to see if there is anything going on that could be a more significant issue  here. Depending on the results of the x-ray we may be looking at an MRI as well. 08/17/2021 upon evaluation today patient appears to be doing well with regard to his ankle ulcer. This actually is showing some signs of improvement which is great news and overall very pleased in that regard. Fortunately there does not appear to be any signs of active infection which is great news as well. No fevers, chills, nausea, vomiting, or diarrhea. With that being said the x-ray showed some surgical abnormalities and along the lateral ankle as well there was some mention of an irregularity. With that being said there was nothing specifically to indicate osteomyelitis as a probable diagnosis here. With that being said I did discuss this with the patient and his wife today their opinion is to hold off on an MRI at this point especially since his wound seems to be showing signs of healing. Fortunately there is no evidence of active infection systemically at this point. 09/03/2021 upon evaluation today patient appears to be doing well with regard to his wound. He has been tolerating the dressing changes  without complication. Fortunately there does not appear to be any signs of active infection which is also great news. In general I am very happy with where we stand. 09/17/2021 upon evaluation today patient appears to be doing well currently in regard to his wound. He has been tolerating the dressing changes without complication. Fortunately there does not appear to be any evidence of active infection which is great news. No fevers, chills, nausea, Lefevre, Brodee G. ([id number]) vomiting, or diarrhea. Objective Constitutional Well-nourished and well-hydrated in no acute distress. Vitals Time Taken: 1:14 PM, Height: 66 in, Weight: 161 lbs, BMI: 26, Temperature: 98.4 F, Pulse: 68 bpm, Respiratory Rate: 18 breaths/min, Blood Pressure: 154/91 mmHg. Respiratory normal breathing without difficulty. Psychiatric this  patient is able to make decisions and demonstrates good insight into disease process. Alert and Oriented x 3. pleasant and cooperative. General Notes: Upon inspection patient's wound bed showed signs of good granulation and epithelization at this point. I do not see any evidence whatsoever of active infection which is great news and overall I think that we are definitely showing signs of excellent improvement all of which is great news. I think that this is slow to heal but nonetheless does seem to be making good progress. Integumentary (Hair, Skin) Wound #1 status is Open. Original cause of wound was Trauma. The date acquired was: 05/09/2021. The wound has been in treatment 13 weeks. The wound is located on the Right,Lateral Ankle. The wound measures 1.2cm length x 1.8cm width x 0.5cm depth; 1.696cm^2 area and 0.848cm^3 volume. There is Fat Layer (Subcutaneous Tissue) exposed. There is no tunneling or undermining noted. There is a medium amount of serosanguineous drainage noted. There is large (67-100%) red, pink, hyper - granulation within the wound bed. There is a small (1-33%) amount of necrotic tissue within the wound bed including Adherent Slough. Assessment Active Problems ICD-10 Laceration without foreign body, right ankle, initial encounter Non-pressure chronic ulcer of right ankle with fat layer exposed Essential (primary) hypertension Plan Follow-up Appointments: Return Appointment in 2 weeks. Bathing/ Shower/ Hygiene: May shower; gently cleanse wound with antibacterial soap, rinse and pat dry prior to dressing wounds Edema Control - Lymphedema / Segmental Compressive Device / Other: Elevate, Exercise Daily and Avoid Standing for Long Periods of Time. Elevate legs to the level of the heart and pump ankles as often as possible Elevate leg(s) parallel to the floor when sitting. WOUND #1: - Ankle Wound Laterality: Right, Lateral Cleanser: Soap and Water 3 x Per Week/30  Days Discharge Instructions: Gently cleanse wound with antibacterial soap, rinse and pat dry prior to dressing wounds Primary Dressing: Prisma 4.34 (in) (Generic) 3 x Per Week/30 Days Discharge Instructions: Moisten w/normal saline or sterile water; Cover wound as directed. Do not remove from wound bed. Secondary Dressing: Foam Dressing, 4x4 (in/in) 3 x Per Week/30 Days Secondary Dressing: Kerlix 4.5 x 4.1 (in/yd) 3 x Per Week/30 Days Discharge Instructions: Apply Kerlix 4.5 x 4.1 (in/yd) as instructed Secondary Dressing: coban 3 x Per Week/30 Days OMAREE, FUQUA. ([id number]) 1. Would recommend currently that we continue with wound care measures as before and the patient is in agreement the plan. This includes the use of the silver collagen dressing which I think is doing a good job here. 2. I am also can recommend that we continue with the foam cut to size to fit underneath. 3. I am also can recommend that we continue with the Curlex and roll gauze to secure in place followed by the Coban.  This does well for him while working. We will see patient back for reevaluation in 1 week here in the clinic. If anything worsens or changes patient will contact our office for additional recommendations. Electronic Signature(s) Signed: 09/17/2021 1:32:48 PM By: Worthy Keeler PA-C Entered By: Worthy Keeler on 09/17/2021 13:32:48 [name] ([id number]) -------------------------------------------------------------------------------- SuperBill Details Patient Name: [name] Date of Service: 09/17/2021 Medical Record Number: [id number] Patient Account Number: [id number] Date of Birth/Sex: [birth year] (60 y.o. M) Treating RN: Cornell Barman Primary Care Provider: Halina Maidens Other Clinician: Referring Provider: Halina Maidens Treating Provider/Extender: Skipper Cliche in Treatment: 13 Diagnosis Coding ICD-10 Codes Code Description [phone] Laceration without foreign body, right  ankle, initial encounter Q22.411 Non-pressure chronic ulcer of right ankle with fat layer exposed [place] (primary) hypertension Facility Procedures CPT4 Code: [id number] Description: 99213 - WOUND CARE VISIT-LEV 3 EST PT Modifier: Quantity: 1 Physician Procedures CPT4 Code: [id number] Description: 34961 - WC PHYS LEVEL 3 - EST PT Modifier: Quantity: 1 CPT4 Code: Description: ICD-10 Diagnosis Description T64.353P Laceration without foreign body, right ankle, initial encounter N22.583 Non-pressure chronic ulcer of right ankle with fat layer exposed I10 Essential (primary) hypertension Modifier: Quantity: Electronic Signature(s) Signed: 09/17/2021 1:38:49 PM By: Gretta Cool, BSN, RN, CWS, Kim RN, BSN Signed: 09/17/2021 4:54:51 PM By: Worthy Keeler PA-C Previous Signature: 09/17/2021 1:32:58 PM Version By: Worthy Keeler PA-C Entered By: Gretta Cool, BSN, RN, CWS, Kim on 09/17/2021 13:38:48

## 2021-09-18 NOTE — Progress Notes (Signed)
[name], [name] ([id number]) Visit Report for 09/17/2021 Arrival Information Details Patient Name: [name] Date of Service: 09/17/2021 1:15 PM Medical Record Number: [id number] Patient Account Number: [id number] Date of Birth/Sex: 25-Sep-1961 (60 y.o. M) Treating RN: Cornell Barman Primary Care Janera Peugh: Halina Maidens Other Clinician: Referring Jin Capote: Halina Maidens Treating Miyah Hampshire/Extender: Skipper Cliche in Treatment: 66 Visit Information History Since Last Visit Added or deleted any medications: No Patient Arrived: Ambulatory Has Dressing in Place as Prescribed: Yes Arrival Time: 13:13 Pain Present Now: No Transfer Assistance: None Patient Identification Verified: Yes Secondary Verification Process Completed: Yes Patient Requires Transmission-Based Precautions: No Patient Has Alerts: Yes Patient Alerts: NOT diabetic Electronic Signature(s) Signed: 09/17/2021 6:05:18 PM By: Gretta Cool, BSN, RN, CWS, Kim RN, BSN Entered By: Gretta Cool, BSN, RN, CWS, Kim on 09/17/2021 13:13:52 [name] ([id number]) -------------------------------------------------------------------------------- Clinic Level of Care Assessment Details Patient Name: [name] Date of Service: 09/17/2021 1:15 PM Medical Record Number: [id number] Patient Account Number: [id number] Date of Birth/Sex: 1961/04/24 (60 y.o. M) Treating RN: Cornell Barman Primary Care Tonia Avino: Halina Maidens Other Clinician: Referring Harlem Bula: Halina Maidens Treating Aycen Porreca/Extender: Skipper Cliche in Treatment: 13 Clinic Level of Care Assessment Items TOOL 4 Quantity Score []  - Use when only an EandM is performed on FOLLOW-UP visit 0 ASSESSMENTS - Nursing Assessment / Reassessment X - Reassessment of Co-morbidities (includes updates in patient status) 1 10 X- 1 5 Reassessment of Adherence to Treatment Plan ASSESSMENTS - Wound and Skin Assessment / Reassessment X - Simple Wound Assessment / Reassessment -  one wound 1 5 []  - 0 Complex Wound Assessment / Reassessment - multiple wounds []  - 0 Dermatologic / Skin Assessment (not related to wound area) ASSESSMENTS - Focused Assessment []  - Circumferential Edema Measurements - multi extremities 0 []  - 0 Nutritional Assessment / Counseling / Intervention []  - 0 Lower Extremity Assessment (monofilament, tuning fork, pulses) []  - 0 Peripheral Arterial Disease Assessment (using hand held doppler) ASSESSMENTS - Ostomy and/or Continence Assessment and Care []  - Incontinence Assessment and Management 0 []  - 0 Ostomy Care Assessment and Management (repouching, etc.) PROCESS - Coordination of Care X - Simple Patient / Family Education for ongoing care 1 15 []  - 0 Complex (extensive) Patient / Family Education for ongoing care X- 1 10 Staff obtains Consents, Records, Test Results / Process Orders []  - 0 Staff telephones HHA, Nursing Homes / Clarify orders / etc []  - 0 Routine Transfer to another Facility (non-emergent condition) []  - 0 Routine Hospital Admission (non-emergent condition) []  - 0 New Admissions / Biomedical engineer / Ordering NPWT, Apligraf, etc. []  - 0 Emergency Hospital Admission (emergent condition) X- 1 10 Simple Discharge Coordination []  - 0 Complex (extensive) Discharge Coordination PROCESS - Special Needs []  - Pediatric / Minor Patient Management 0 []  - 0 Isolation Patient Management []  - 0 Hearing / Language / Visual special needs []  - 0 Assessment of Community assistance (transportation, D/C planning, etc.) []  - 0 Additional assistance / Altered mentation []  - 0 Support Surface(s) Assessment (bed, cushion, seat, etc.) INTERVENTIONS - Wound Cleansing / Measurement [name]. ([id number]) X- 1 5 Simple Wound Cleansing - one wound []  - 0 Complex Wound Cleansing - multiple wounds X- 1 5 Wound Imaging (photographs - any number of wounds) []  - 0 Wound Tracing (instead of photographs) X- 1  5 Simple Wound Measurement - one wound []  - 0 Complex Wound Measurement - multiple wounds INTERVENTIONS - Wound Dressings []  - Small Wound Dressing one or  multiple wounds 0 X- 1 15 Medium Wound Dressing one or multiple wounds []  - 0 Large Wound Dressing one or multiple wounds []  - 0 Application of Medications - topical []  - 0 Application of Medications - injection INTERVENTIONS - Miscellaneous []  - External ear exam 0 []  - 0 Specimen Collection (cultures, biopsies, blood, body fluids, etc.) []  - 0 Specimen(s) / Culture(s) sent or taken to Lab for analysis []  - 0 Patient Transfer (multiple staff / Civil Service fast streamer / Similar devices) []  - 0 Simple Staple / Suture removal (25 or less) []  - 0 Complex Staple / Suture removal (26 or more) []  - 0 Hypo / Hyperglycemic Management (close monitor of Blood Glucose) []  - 0 Ankle / Brachial Index (ABI) - do not check if billed separately X- 1 5 Vital Signs Has the patient been seen at the hospital within the last three years: Yes Total Score: 90 Level Of Care: New/Established - Level 3 Electronic Signature(s) Signed: 09/17/2021 6:05:18 PM By: Gretta Cool, BSN, RN, CWS, Kim RN, BSN Entered By: Gretta Cool, BSN, RN, CWS, Kim on 09/17/2021 13:37:51 [name] ([id number]) -------------------------------------------------------------------------------- Encounter Discharge Information Details Patient Name: [name] Date of Service: 09/17/2021 1:15 PM Medical Record Number: [id number] Patient Account Number: [id number] Date of Birth/Sex: 04-Apr-1961 (60 y.o. M) Treating RN: Cornell Barman Primary Care Marcelis Wissner: Halina Maidens Other Clinician: Referring Thang Flett: Halina Maidens Treating Shikira Folino/Extender: Skipper Cliche in Treatment: 13 Encounter Discharge Information Items Discharge Condition: Stable Ambulatory Status: Ambulatory Discharge Destination: Home Transportation: Private Auto Accompanied By: wife Schedule Follow-up  Appointment: Yes Clinical Summary of Care: Electronic Signature(s) Signed: 09/17/2021 1:41:57 PM By: Gretta Cool, BSN, RN, CWS, Kim RN, BSN Entered By: Gretta Cool, BSN, RN, CWS, Kim on 09/17/2021 13:41:57 [name] ([id number]) -------------------------------------------------------------------------------- Lower Extremity Assessment Details Patient Name: [name] Date of Service: 09/17/2021 1:15 PM Medical Record Number: [id number] Patient Account Number: [id number] Date of Birth/Sex: 1961-03-18 (60 y.o. M) Treating RN: Cornell Barman Primary Care Anjenette Gerbino: Halina Maidens Other Clinician: Referring Rashawna Scoles: Halina Maidens Treating Anastazja Isaac/Extender: Jeri Cos Weeks in Treatment: 13 Edema Assessment Assessed: Shirlyn Goltz: No] Patrice Paradise: Yes] Edema: [Left: N] [Right: o] Vascular Assessment Pulses: Dorsalis Pedis Palpable: [Right:Yes] Electronic Signature(s) Signed: 09/17/2021 6:05:18 PM By: Gretta Cool, BSN, RN, CWS, Kim RN, BSN Entered By: Gretta Cool, BSN, RN, CWS, Kim on 09/17/2021 13:21:21 [name] ([id number]) -------------------------------------------------------------------------------- Multi Wound Chart Details Patient Name: [name] Date of Service: 09/17/2021 1:15 PM Medical Record Number: [id number] Patient Account Number: [id number] Date of Birth/Sex: 1961-10-10 (60 y.o. M) Treating RN: Cornell Barman Primary Care Mccartney Chuba: Halina Maidens Other Clinician: Referring Makinzi Prieur: Halina Maidens Treating Sinahi Knights/Extender: Skipper Cliche in Treatment: 13 Vital Signs Height(in): 66 Pulse(bpm): 26 Weight(lbs): 161 Blood Pressure(mmHg): 154/91 Body Mass Index(BMI): 26 Temperature(F): 98.4 Respiratory Rate(breaths/min): 18 Photos: [N/A:N/A] Wound Location: Right, Lateral Ankle N/A N/A Wounding Event: Trauma N/A N/A Primary Etiology: Trauma, Other N/A N/A Comorbid History: Hypertension N/A N/A Date Acquired: 05/09/2021 N/A N/A Weeks of Treatment: 13 N/A  N/A Wound Status: Open N/A N/A Measurements L x W x D (cm) 1.2x1.8x0.5 N/A N/A Area (cm) : 1.696 N/A N/A Volume (cm) : 0.848 N/A N/A % Reduction in Area: -116.10% N/A N/A % Reduction in Volume: -115.80% N/A N/A Classification: Full Thickness Without Exposed N/A N/A Support Structures Exudate Amount: Medium N/A N/A Exudate Type: Serosanguineous N/A N/A Exudate Color: red, brown N/A N/A Granulation Amount: Large (67-100%) N/A N/A Granulation Quality: Red, Pink, Hyper-granulation N/A N/A Necrotic Amount: Small (1-33%) N/A N/A Exposed Structures:  Fat Layer (Subcutaneous Tissue): N/A N/A Yes Fascia: No Tendon: No Muscle: No Joint: No Bone: No Epithelialization: None N/A N/A Treatment Notes Electronic Signature(s) Signed: 09/17/2021 6:05:18 PM By: Gretta Cool, BSN, RN, CWS, Kim RN, BSN Entered By: Gretta Cool, BSN, RN, CWS, Kim on 09/17/2021 13:22:09 [name] ([id number]) -------------------------------------------------------------------------------- [place] Details Patient Name: [name] Date of Service: 09/17/2021 1:15 PM Medical Record Number: [id number] Patient Account Number: [id number] Date of Birth/Sex: 1961/10/11 (60 y.o. M) Treating RN: Cornell Barman Primary Care Emanuell Morina: Halina Maidens Other Clinician: Referring Sherrine Salberg: Halina Maidens Treating Osualdo Hansell/Extender: Skipper Cliche in Treatment: 13 Active Inactive Wound/Skin Impairment Nursing Diagnoses: Knowledge deficit related to ulceration/compromised skin integrity Goals: Patient/caregiver will verbalize understanding of skin care regimen Date Initiated: 06/18/2021 Target Resolution Date: 09/18/2021 Goal Status: Active Ulcer/skin breakdown will have a volume reduction of 30% by week 4 Date Initiated: 06/18/2021 Date Inactivated: 07/16/2021 Target Resolution Date: 07/18/2021 Goal Status: Unmet Unmet Reason: comorbities Ulcer/skin breakdown will have a volume reduction of 50% by week  8 Date Initiated: 06/18/2021 Date Inactivated: 08/20/2021 Target Resolution Date: 08/18/2021 Goal Status: Unmet Unmet Reason: comorbites Ulcer/skin breakdown will have a volume reduction of 80% by week 12 Date Initiated: 06/18/2021 Target Resolution Date: 09/18/2021 Goal Status: Active Ulcer/skin breakdown will heal within 14 weeks Date Initiated: 06/18/2021 Target Resolution Date: 10/18/2021 Goal Status: Active Interventions: Assess patient/caregiver ability to obtain necessary supplies Assess patient/caregiver ability to perform ulcer/skin care regimen upon admission and as needed Assess ulceration(s) every visit Notes: Electronic Signature(s) Signed: 09/17/2021 6:05:18 PM By: Gretta Cool, BSN, RN, CWS, Kim RN, BSN Entered By: Gretta Cool, BSN, RN, CWS, Kim on 09/17/2021 13:22:02 [name] ([id number]) -------------------------------------------------------------------------------- Pain Assessment Details Patient Name: [name] Date of Service: 09/17/2021 1:15 PM Medical Record Number: [id number] Patient Account Number: [id number] Date of Birth/Sex: 13-Jan-1961 (60 y.o. M) Treating RN: Cornell Barman Primary Care Almadelia Looman: Halina Maidens Other Clinician: Referring Seville Downs: Halina Maidens Treating Brinkley Peet/Extender: Skipper Cliche in Treatment: 13 Active Problems Location of Pain Severity and Description of Pain Patient Has Paino No Site Locations Pain Management and Medication Current Pain Management: Notes Patient denies pain at this time. Electronic Signature(s) Signed: 09/17/2021 6:05:18 PM By: Gretta Cool, BSN, RN, CWS, Kim RN, BSN Entered By: Gretta Cool, BSN, RN, CWS, Kim on 09/17/2021 13:14:45 [name] ([id number]) -------------------------------------------------------------------------------- Patient/Caregiver Education Details Patient Name: [name] Date of Service: 09/17/2021 1:15 PM Medical Record Number: [id number] Patient Account Number:  [id number] Date of Birth/Gender: November 16, 1961 (60 y.o. M) Treating RN: Cornell Barman Primary Care Physician: Halina Maidens Other Clinician: Referring Physician: Halina Maidens Treating Physician/Extender: Skipper Cliche in Treatment: 13 Education Assessment Education Provided To: Patient Education Topics Provided Wound/Skin Impairment: Handouts: Caring for Your Ulcer, Other: Continue wound care as prescribed Methods: Demonstration, Explain/Verbal Responses: State content correctly Electronic Signature(s) Signed: 09/17/2021 6:05:18 PM By: Gretta Cool, BSN, RN, CWS, Kim RN, BSN Entered By: Gretta Cool, BSN, RN, CWS, Kim on 09/17/2021 13:41:06 [name] ([id number]) -------------------------------------------------------------------------------- Wound Assessment Details Patient Name: [name] Date of Service: 09/17/2021 1:15 PM Medical Record Number: [id number] Patient Account Number: [id number] Date of Birth/Sex: Jul 29, 1961 (60 y.o. M) Treating RN: Cornell Barman Primary Care Cristhian Vanhook: Halina Maidens Other Clinician: Referring Elias Dennington: Halina Maidens Treating Antonyo Hinderer/Extender: Jeri Cos Weeks in Treatment: 13 Wound Status Wound Number: 1 Primary Etiology: Trauma, Other Wound Location: Right, Lateral Ankle Wound Status: Open Wounding Event: Trauma Comorbid History: Hypertension Date Acquired: 05/09/2021 Weeks Of Treatment: 13 Clustered Wound: No Photos Wound Measurements Length: (cm)  1.2 Width: (cm) 1.8 Depth: (cm) 0.5 Area: (cm) 1.696 Volume: (cm) 0.848 % Reduction in Area: -116.1% % Reduction in Volume: -115.8% Epithelialization: None Tunneling: No Undermining: No Wound Description Classification: Full Thickness Without Exposed Support Structures Exudate Amount: Medium Exudate Type: Serosanguineous Exudate Color: red, brown Foul Odor After Cleansing: No Slough/Fibrino Yes Wound Bed Granulation Amount: Large (67-100%) Exposed Structure Granulation  Quality: Red, Pink, Hyper-granulation Fascia Exposed: No Necrotic Amount: Small (1-33%) Fat Layer (Subcutaneous Tissue) Exposed: Yes Necrotic Quality: Adherent Slough Tendon Exposed: No Muscle Exposed: No Joint Exposed: No Bone Exposed: No Treatment Notes Wound #1 (Ankle) Wound Laterality: Right, Lateral Cleanser Soap and Water Discharge Instruction: Gently cleanse wound with antibacterial soap, rinse and pat dry prior to dressing wounds Peri-Wound Care JAKUB, DEBOLD ([id number]) Topical Primary Dressing Prisma 4.34 (in) Discharge Instruction: Moisten w/normal saline or sterile water; Cover wound as directed. Do not remove from wound bed. Secondary Dressing Foam Dressing, 4x4 (in/in) Kerlix 4.5 x 4.1 (in/yd) Discharge Instruction: Apply Kerlix 4.5 x 4.1 (in/yd) as instructed coban Secured With Compression Wrap Compression Stockings Environmental education officer) Signed: 09/17/2021 6:05:18 PM By: Gretta Cool, BSN, RN, CWS, Kim RN, BSN Entered By: Gretta Cool, BSN, RN, CWS, Kim on 09/17/2021 13:21:04 [name] ([id number]) -------------------------------------------------------------------------------- [place] Details Patient Name: [name] Date of Service: 09/17/2021 1:15 PM Medical Record Number: [id number] Patient Account Number: [id number] Date of Birth/Sex: [birth year] (60 y.o. M) Treating RN: Cornell Barman Primary Care Pheonix Wisby: Halina Maidens Other Clinician: Referring Marquavious Nazar: Halina Maidens Treating Brenn Gatton/Extender: Skipper Cliche in Treatment: 13 Vital Signs Time Taken: 13:14 Temperature (F): 98.4 Height (in): 66 Pulse (bpm): 68 Weight (lbs): 161 Respiratory Rate (breaths/min): 18 Body Mass Index (BMI): 26 Blood Pressure (mmHg): 154/91 Reference Range: 80 - 120 mg / dl Electronic Signature(s) Signed: 09/17/2021 6:05:18 PM By: Gretta Cool, BSN, RN, CWS, Kim RN, BSN Entered By: Gretta Cool, BSN, RN, CWS, Kim on 09/17/2021 13:14:21

## 2021-10-01 ENCOUNTER — Encounter: Payer: 59 | Attending: Physician Assistant | Admitting: Physician Assistant

## 2021-10-01 ENCOUNTER — Other Ambulatory Visit: Payer: Self-pay

## 2021-10-01 DIAGNOSIS — L97312 Non-pressure chronic ulcer of right ankle with fat layer exposed: Secondary | ICD-10-CM | POA: Insufficient documentation

## 2021-10-01 DIAGNOSIS — I10 Essential (primary) hypertension: Secondary | ICD-10-CM | POA: Insufficient documentation

## 2021-10-01 NOTE — Progress Notes (Addendum)
CHRISTOS, MIXSON ([id number]) Visit Report for 10/01/2021 Arrival Information Details Patient Name: [name], [name] Date of Service: 10/01/2021 3:00 PM Medical Record Number: [id number] Patient Account Number: [id number] Date of Birth/Sex: Sep 14, 1961 (60 y.o. M) Treating RN: Carlene Coria Primary Care Deniya Craigo: Halina Maidens Other Clinician: Referring Liann Spaeth: Halina Maidens Treating Loda Bialas/Extender: Skipper Cliche in Treatment: 15 Visit Information History Since Last Visit All ordered tests and consults were completed: No Patient Arrived: Ambulatory Added or deleted any medications: No Arrival Time: 15:05 Any new allergies or adverse reactions: No Accompanied By: wife Had a fall or experienced change in No Transfer Assistance: None activities of daily living that may affect Patient Identification Verified: Yes risk of [name]: Secondary Verification Process Completed: Yes Signs or symptoms of abuse/neglect since last visito No Patient Requires Transmission-Based Precautions: No Hospitalized since last visit: No Patient Has Alerts: Yes Implantable device outside of the clinic excluding No Patient Alerts: NOT diabetic cellular tissue based products placed in the center since last visit: Has Dressing in Place as Prescribed: Yes Pain Present Now: No Electronic Signature(s) Signed: 10/07/2021 1:01:44 PM By: Carlene Coria RN Entered By: Carlene Coria on 10/01/2021 15:10:05 [name] ([id number]) -------------------------------------------------------------------------------- Clinic Level of Care Assessment Details Patient Name: [name] Date of Service: 10/01/2021 3:00 PM Medical Record Number: [id number] Patient Account Number: [id number] Date of Birth/Sex: [birth year] (60 y.o. M) Treating RN: Carlene Coria Primary Care Braian Tijerina: Halina Maidens Other Clinician: Referring Ali Mohl: Halina Maidens Treating Hedwig Mcfall/Extender: Skipper Cliche in  Treatment: 15 Clinic Level of Care Assessment Items TOOL 4 Quantity Score X - Use when only an EandM is performed on FOLLOW-UP visit 1 0 ASSESSMENTS - Nursing Assessment / Reassessment X - Reassessment of Co-morbidities (includes updates in patient status) 1 10 X- 1 5 Reassessment of Adherence to Treatment Plan ASSESSMENTS - Wound and Skin Assessment / Reassessment X - Simple Wound Assessment / Reassessment - one wound 1 5 _0  - 0 Complex Wound Assessment / Reassessment - multiple wounds _1  - 0 Dermatologic / Skin Assessment (not related to wound area) ASSESSMENTS - Focused Assessment _2  - Circumferential Edema Measurements - multi extremities 0 _3  - 0 Nutritional Assessment / Counseling / Intervention _4  - 0 Lower Extremity Assessment (monofilament, tuning fork, pulses) _5  - 0 Peripheral Arterial Disease Assessment (using hand held doppler) ASSESSMENTS - Ostomy and/or Continence Assessment and Care _6  - Incontinence Assessment and Management 0 _7  - 0 Ostomy Care Assessment and Management (repouching, etc.) PROCESS - Coordination of Care X - Simple Patient / Family Education for ongoing care 1 15 _8  - 0 Complex (extensive) Patient / Family Education for ongoing care _9  - 0 Staff obtains Programmer, systems, Records, Test Results / Process Orders _10  - 0 Staff telephones HHA, Nursing Homes / Clarify orders / etc _11  - 0 Routine Transfer to another Facility (non-emergent condition) _12  - 0 Routine Hospital Admission (non-emergent condition) _13  - 0 New Admissions / Biomedical engineer / Ordering NPWT, Apligraf, etc. _14  - 0 Emergency Hospital Admission (emergent condition) X- 1 10 Simple Discharge Coordination _15  - 0 Complex (extensive) Discharge Coordination PROCESS - Special Needs _16  - Pediatric / Minor Patient Management 0 _17  - 0 Isolation Patient Management _18  - 0 Hearing / Language / Visual special needs _19  - 0 Assessment of Community assistance (transportation, D/C  planning, etc.) _20  - 0 Additional assistance / Altered mentation _21  - 0 Support Surface(s) Assessment (bed, cushion, seat, etc.) INTERVENTIONS - Wound Cleansing / Measurement [name], [name]. ([id number]) X- 1 5  Simple Wound Cleansing - one wound _0  - 0 Complex Wound Cleansing - multiple wounds X- 1 5 Wound Imaging (photographs - any number of wounds) _1  - 0 Wound Tracing (instead of photographs) X- 1 5 Simple Wound Measurement - one wound _2  - 0 Complex Wound Measurement - multiple wounds INTERVENTIONS - Wound Dressings X - Small Wound Dressing one or multiple wounds 1 10 _3  - 0 Medium Wound Dressing one or multiple wounds _4  - 0 Large Wound Dressing one or multiple wounds X- 1 5 Application of Medications - topical <[id number]>_9<\/[id number]>_0  - 0 Application of Medications - injection INTERVENTIONS - Miscellaneous _6  - External ear exam 0 _7  - 0 Specimen Collection (cultures, biopsies, blood, body fluids, etc.) _8  - 0 Specimen(s) / Culture(s) sent or taken to Lab for analysis _9  - 0 Patient Transfer (multiple staff / Civil Service fast streamer / Similar devices) _10  - 0 Simple Staple / Suture removal (25 or less) _11  - 0 Complex Staple / Suture removal (26 or more) _12  - 0 Hypo / Hyperglycemic Management (close monitor of Blood Glucose) _13  - 0 Ankle / Brachial Index (ABI) - do not check if billed separately X- 1 5 Vital Signs Has the patient been seen at the hospital within the last three years: Yes Total Score: 80 Level Of Care: New/Established - Level 3 Electronic Signature(s) Signed: 10/07/2021 1:01:44 PM By: Carlene Coria RN Entered By: Carlene Coria on 10/01/2021 15:43:48 [name] ([id number]) -------------------------------------------------------------------------------- Encounter Discharge Information Details Patient Name: [name] Date of Service: 10/01/2021 3:00 PM Medical Record Number: [id number] Patient Account Number: [id number] Date of Birth/Sex: 16-Sep-1961 (60 y.o.  M) Treating RN: Carlene Coria Primary Care Lovett Coffin: Halina Maidens Other Clinician: Referring Antione Obar: Halina Maidens Treating Britlyn Martine/Extender: Skipper Cliche in Treatment: 15 Encounter Discharge Information Items Discharge Condition: Stable Ambulatory Status: Ambulatory Discharge Destination: Home Transportation: Private Auto Accompanied By: self Schedule Follow-up Appointment: Yes Clinical Summary of Care: Patient Declined Electronic Signature(s) Signed: 10/07/2021 1:01:44 PM By: Carlene Coria RN Entered By: Carlene Coria on 10/01/2021 15:51:51 [name] ([id number]) -------------------------------------------------------------------------------- Lower Extremity Assessment Details Patient Name: [name] Date of Service: 10/01/2021 3:00 PM Medical Record Number: [id number] Patient Account Number: [id number] Date of Birth/Sex: 03-Dec-1961 (60 y.o. M) Treating RN: Carlene Coria Primary Care Sinthia Karabin: Halina Maidens Other Clinician: Referring Clarann Helvey: Halina Maidens Treating Bronc Brosseau/Extender: Jeri Cos Weeks in Treatment: 15 Edema Assessment Assessed: [Left: No] [Right: No] Edema: [Left: N] [Right: o] Vascular Assessment Pulses: Dorsalis Pedis Palpable: [Right:Yes] Electronic Signature(s) Signed: 10/07/2021 1:01:44 PM By: Carlene Coria RN Entered By: Carlene Coria on 10/01/2021 15:18:49 [name] ([id number]) -------------------------------------------------------------------------------- Multi Wound Chart Details Patient Name: [name] Date of Service: 10/01/2021 3:00 PM Medical Record Number: [id number] Patient Account Number: [id number] Date of Birth/Sex: [birth year] (60 y.o. M) Treating RN: Carlene Coria Primary Care Krupa Stege: Halina Maidens Other Clinician: Referring Jodey Burbano: Halina Maidens Treating Beth Spackman/Extender: Skipper Cliche in Treatment: 15 Vital Signs Height(in): 66 Pulse(bpm): 74 Weight(lbs): 161 Blood  Pressure(mmHg): 145/77 Body Mass Index(BMI): 26 Temperature(F): 98.4 Respiratory Rate(breaths/min): 18 Photos: [N/A:N/A] Wound Location: Right, Lateral Ankle N/A N/A Wounding Event: Trauma N/A N/A Primary Etiology: Trauma, Other N/A N/A Comorbid History: Hypertension N/A N/A Date Acquired: 05/09/2021 N/A N/A Weeks of Treatment: 15 N/A N/A Wound Status: Open N/A N/A Measurements L x W x D (cm) 1.7x0.8x0.5 N/A N/A Area (cm) : 1.068 N/A N/A Volume (cm) : 0.534 N/A N/A % Reduction in Area: -36.10% N/A N/A % Reduction in Volume: -35.90% N/A N/A Classification:  Full Thickness Without Exposed N/A N/A Support Structures Exudate Amount: Medium N/A N/A Exudate Type: Serosanguineous N/A N/A Exudate Color: red, brown N/A N/A Granulation Amount: Large (67-100%) N/A N/A Granulation Quality: Red, Pink, Hyper-granulation N/A N/A Necrotic Amount: Small (1-33%) N/A N/A Exposed Structures: Fat Layer (Subcutaneous Tissue): N/A N/A Yes Fascia: No Tendon: No Muscle: No Joint: No Bone: No Epithelialization: None N/A N/A Treatment Notes Electronic Signature(s) Signed: 10/07/2021 1:01:44 PM By: Carlene Coria RN Entered By: Carlene Coria on 10/01/2021 15:42:37 [name] ([id number]) -------------------------------------------------------------------------------- Multi-Disciplinary Care Plan Details Patient Name: [name] Date of Service: 10/01/2021 3:00 PM Medical Record Number: [id number] Patient Account Number: [id number] Date of Birth/Sex: 06-22-1961 (60 y.o. M) Treating RN: Carlene Coria Primary Care Judeen Geralds: Halina Maidens Other Clinician: Referring Audri Kozub: Halina Maidens Treating Motty Borin/Extender: Skipper Cliche in Treatment: 15 Active Inactive Wound/Skin Impairment Nursing Diagnoses: Knowledge deficit related to ulceration/compromised skin integrity Goals: Patient/caregiver will verbalize understanding of skin care regimen Date Initiated: 06/18/2021 Target  Resolution Date: 09/18/2021 Goal Status: Active Ulcer/skin breakdown will have a volume reduction of 30% by week 4 Date Initiated: 06/18/2021 Date Inactivated: 07/16/2021 Target Resolution Date: 07/18/2021 Goal Status: Unmet Unmet Reason: comorbities Ulcer/skin breakdown will have a volume reduction of 50% by week 8 Date Initiated: 06/18/2021 Date Inactivated: 08/20/2021 Target Resolution Date: 08/18/2021 Goal Status: Unmet Unmet Reason: comorbites Ulcer/skin breakdown will have a volume reduction of 80% by week 12 Date Initiated: 06/18/2021 Date Inactivated: 10/01/2021 Target Resolution Date: 09/18/2021 Goal Status: Met Ulcer/skin breakdown will heal within 14 weeks Date Initiated: 06/18/2021 Target Resolution Date: 10/18/2021 Goal Status: Active Interventions: Assess patient/caregiver ability to obtain necessary supplies Assess patient/caregiver ability to perform ulcer/skin care regimen upon admission and as needed Assess ulceration(s) every visit Notes: Electronic Signature(s) Signed: 10/07/2021 1:01:44 PM By: Carlene Coria RN Entered By: Carlene Coria on 10/01/2021 15:42:28 [name] ([id number]) -------------------------------------------------------------------------------- Pain Assessment Details Patient Name: [name] Date of Service: 10/01/2021 3:00 PM Medical Record Number: [id number] Patient Account Number: [id number] Date of Birth/Sex: [birth year] (60 y.o. M) Treating RN: Carlene Coria Primary Care Dilan Fullenwider: Halina Maidens Other Clinician: Referring Esme Freund: Halina Maidens Treating Daquana Paddock/Extender: Skipper Cliche in Treatment: 15 Active Problems Location of Pain Severity and Description of Pain Patient Has Paino No Site Locations Pain Management and Medication Current Pain Management: Electronic Signature(s) Signed: 10/07/2021 1:01:44 PM By: Carlene Coria RN Entered By: Carlene Coria on 10/01/2021 15:10:33 [name]  ([id number]) -------------------------------------------------------------------------------- Wound Assessment Details Patient Name: [name] Date of Service: 10/01/2021 3:00 PM Medical Record Number: [id number] Patient Account Number: [id number] Date of Birth/Sex: 08-12-1961 (60 y.o. M) Treating RN: Carlene Coria Primary Care Masson Nalepa: Halina Maidens Other Clinician: Referring Gudelia Eugene: Halina Maidens Treating Zakarie Sturdivant/Extender: Jeri Cos Weeks in Treatment: 15 Wound Status Wound Number: 1 Primary Etiology: Trauma, Other Wound Location: Right, Lateral Ankle Wound Status: Open Wounding Event: Trauma Comorbid History: Hypertension Date Acquired: 05/09/2021 Weeks Of Treatment: 15 Clustered Wound: No Photos Wound Measurements Length: (cm) 1.7 Width: (cm) 0.8 Depth: (cm) 0.5 Area: (cm) 1.068 Volume: (cm) 0.534 % Reduction in Area: -36.1% % Reduction in Volume: -35.9% Epithelialization: None Tunneling: No Undermining: No Wound Description Classification: Full Thickness Without Exposed Support Structures Exudate Amount: Medium Exudate Type: Serosanguineous Exudate Color: red, brown Foul Odor After Cleansing: No Slough/Fibrino Yes Wound Bed Granulation Amount: Large (67-100%) Exposed Structure Granulation Quality: Red, Pink, Hyper-granulation Fascia Exposed: No Necrotic Amount: Small (1-33%) Fat Layer (Subcutaneous Tissue) Exposed: Yes Necrotic Quality: Adherent Slough Tendon Exposed: No Muscle Exposed:  No Joint Exposed: No Bone Exposed: No Treatment Notes Wound #1 (Ankle) Wound Laterality: Right, Lateral Cleanser Soap and Water Discharge Instruction: Gently cleanse wound with antibacterial soap, rinse and pat dry prior to dressing wounds [place], [name] ([id number]) Topical Primary Dressing Prisma 4.34 (in) Discharge Instruction: Moisten w/normal saline or sterile water; Cover wound as directed. Do not remove from wound  bed. Secondary Dressing Foam Dressing, 4x4 (in/in) Kerlix 4.5 x 4.1 (in/yd) Discharge Instruction: Apply Kerlix 4.5 x 4.1 (in/yd) as instructed coban Secured With Compression Wrap Compression Stockings Add-Ons Electronic Signature(s) Signed: 10/07/2021 1:01:44 PM By: Carlene Coria RN Entered By: Carlene Coria on 10/01/2021 [name], [place]. ([id number]) -------------------------------------------------------------------------------- [place] Details Patient Name: [name] Date of Service: 10/01/2021 3:00 PM Medical Record Number: [id number] Patient Account Number: [id number] Date of Birth/Sex: April 06, 1961 (60 y.o. M) Treating RN: Carlene Coria Primary Care Shayleen Eppinger: Halina Maidens Other Clinician: Referring Trevonn Hallum: Halina Maidens Treating Diamante Rubin/Extender: Skipper Cliche in Treatment: 15 Vital Signs Time Taken: 15:10 Temperature (F): 98.4 Height (in): 66 Pulse (bpm): 74 Weight (lbs): 161 Respiratory Rate (breaths/min): 18 Body Mass Index (BMI): 26 Blood Pressure (mmHg): 145/77 Reference Range: 80 - 120 mg / dl Electronic Signature(s) Signed: 10/07/2021 1:01:44 PM By: Carlene Coria RN Entered By: Carlene Coria on 10/01/2021 15:10:25

## 2021-10-01 NOTE — Progress Notes (Addendum)
DAIMIAN, SUDBERRY ([id number]) Visit Report for 10/01/2021 Chief Complaint Document Details Patient Name: [name], [name]. Date of Service: 10/01/2021 3:00 PM Medical Record Number: [id number] Patient Account Number: [id number] Date of Birth/Sex: 1961/07/31 (60 y.o. M) Treating RN: Carlene Coria Primary Care Provider: Halina Maidens Other Clinician: Referring Provider: Halina Maidens Treating Provider/Extender: Skipper Cliche in Treatment: 15 Information Obtained from: Patient Chief Complaint Right ankle laceration Electronic Signature(s) Signed: 10/01/2021 2:54:29 PM By: Worthy Keeler PA-C Entered By: Worthy Keeler on 10/01/2021 14:54:28 [name] ([id number]) -------------------------------------------------------------------------------- HPI Details Patient Name: [name] Date of Service: 10/01/2021 3:00 PM Medical Record Number: [id number] Patient Account Number: [id number] Date of Birth/Sex: [birth year] (60 y.o. M) Treating RN: Carlene Coria Primary Care Provider: Halina Maidens Other Clinician: Referring Provider: Halina Maidens Treating Provider/Extender: Skipper Cliche in Treatment: 15 History of Present Illness HPI Description: 06/14/2021 upon evaluation today patient appears to be doing somewhat poorly in regard to the wound on his right ankle region. This was an injury that apparently occurred at work according to what he is telling me today. He is seen with his wife in the office at this point. Nonetheless the patient appears to have a circular opening with necrotic tissue in the base of the wound. This does not appear to be doing nearly as good as I would hope. Fortunately there does not appear to be any signs of infection though he does have a significant amount of necrotic tissue this is going to take some time to heal. He also has previously had surgery in this area there is a lot of scar tissue that will again complicate things and extended  healing process. The patient does have hypertension otherwise no major medical problems. 06/25/2021 upon evaluation today patient presents for reevaluation here in our clinic he actually does seem to be doing better in regard to his wound on the right lateral ankle. He has been tolerating the dressing changes without complication. Fortunately I do not see any signs of infection which is great news. 07/02/2021 upon evaluation today patient appears to be doing about the same in regard to his wound. With that being said I do believe that this is cleaner there was a little bit of slough noted we have been using Iodoflex. I was able to see a little bit more granulation tissue starting to try to build up but I think the scar tissue here is going to be the biggest slowing factor with regard to healing. That was discussed with the patient and his wife today. Nonetheless I think we want to switch up things based on what I see at this point. 07/09/2021 upon evaluation today patient appears to be doing about the same in regard to his ankle ulcer. He has been tolerating the dressing changes without complication. Fortunately there does not appear to be any signs of active infection. No fevers, chills, nausea, vomiting, or diarrhea. 07/16/2021 upon evaluation today patient appears to be doing better to some degree in regard to his wound on ankle region. He actually showed some signs of new granulation growth which is actually a good thing. Fortunately there does not appear to be any evidence of active infection and wound did not appear to be too dry upon evaluation today which is also good news. No fevers, chills, nausea, vomiting, or diarrhea. 07/23/2021 upon evaluation today patient appears to be doing a little better in regard to his wound. He has been tolerating the dressing changes without complication. Fortunately  there does not appear to be any signs of active infection which is great news. No fever chills noted we  are still waiting on the results back from the TheraSkin investigation to see whether or not he qualifies for this treatment option. 07/30/2021 upon evaluation today patient appears to be doing a little better again in regard to his wound. He is showing signs of improvement which is great news. I am very pleased with where things stand. I do believe that honestly all things considered he is making good progress but were just not able to do anything as far as advanced modalities to speed this up as insurance has literally devout denied everything including a snap VAC, Apligraf, PuraPly, and TheraSkin. Again I am not really certain what else to do otherwise other than just try to continue along the path we are as far as getting this wound to heal. 08/06/2021 upon evaluation today patient appears to be doing well currently he is of peering to make progress slowly but nonetheless this is at least making progress at this point. There does not appear to be any signs of active infection at this time. No fevers, chills, nausea, vomiting, or diarrhea. With that being said I do think that we are headed in the right direction with what I am seeing currently. I do wish we could do the snap VAC but to be honest I think this is the best we can do with the collagen as it stands currently. 08/13/2021 upon evaluation today patient unfortunately appears to be doing a little bit more poorly in regard to the fact that he is having some pain on the side of his ankle. This does have me a little bit concerned this is on the edge of the wound. There is a very hard area under this that I presume to be more of a calcification although I cannot be certain this is not just a superficial bony protrusion. I do feel it in the base of the wound but again I assumed more calcification due to inflammation from prior surgery but again that may not actually be the case. I do think that we probably need to go forward with an x-ray at this  point to see if there is anything going on that could be a more significant issue here. Depending on the results of the x-ray we may be looking at an MRI as well. 08/17/2021 upon evaluation today patient appears to be doing well with regard to his ankle ulcer. This actually is showing some signs of improvement which is great news and overall very pleased in that regard. Fortunately there does not appear to be any signs of active infection which is great news as well. No fevers, chills, nausea, vomiting, or diarrhea. With that being said the x-ray showed some surgical abnormalities and along the lateral ankle as well there was some mention of an irregularity. With that being said there was nothing specifically to indicate osteomyelitis as a probable diagnosis here. With that being said I did discuss this with the patient and his wife today their opinion is to hold off on an MRI at this point especially since his wound seems to be showing signs of healing. Fortunately there is no evidence of active infection systemically at this point. 09/03/2021 upon evaluation today patient appears to be doing well with regard to his wound. He has been tolerating the dressing changes without complication. Fortunately there does not appear to be any signs of active infection which  is also great news. In general I am very happy with where we stand. 09/17/2021 upon evaluation today patient appears to be doing well currently in regard to his wound. He has been tolerating the dressing changes without complication. Fortunately there does not appear to be any evidence of active infection which is great news. No fevers, chills, nausea, vomiting, or diarrhea. 10/01/2021 upon evaluation today patient appears to be doing well with regard to his ankle ulcer. He has been tolerating the dressing changes without complication. Fortunately there does not appear to be any signs of infection which is great and overall I am extremely pleased  with where we stand today. No fevers, chills, nausea, vomiting, or diarrhea. [name], [name] ([id number]) Electronic Signature(s) Signed: 10/01/2021 5:51:56 PM By: Worthy Keeler PA-C Entered By: Worthy Keeler on 10/01/2021 17:51:56 [name] ([id number]) -------------------------------------------------------------------------------- Physical Exam Details Patient Name: [name] Date of Service: 10/01/2021 3:00 PM Medical Record Number: [id number] Patient Account Number: [id number] Date of Birth/Sex: May 16, 1961 (60 y.o. M) Treating RN: Carlene Coria Primary Care Provider: Halina Maidens Other Clinician: Referring Provider: Halina Maidens Treating Provider/Extender: Jeri Cos Weeks in Treatment: 39 Constitutional Well-nourished and well-hydrated in no acute distress. Respiratory normal breathing without difficulty. Psychiatric this patient is able to make decisions and demonstrates good insight into disease process. Alert and Oriented x 3. pleasant and cooperative. Notes Upon inspection patient's wound bed showed signs of good granulation and epithelization at this point. I am very pleased with the overall size reduction as well and in general I think he is headed in the right direction. Electronic Signature(s) Signed: 10/01/2021 5:52:18 PM By: Worthy Keeler PA-C Entered By: Worthy Keeler on 10/01/2021 17:52:18 [name] ([id number]) -------------------------------------------------------------------------------- Physician Orders Details Patient Name: [name] Date of Service: 10/01/2021 3:00 PM Medical Record Number: [id number] Patient Account Number: [id number] Date of Birth/Sex: 12-30-1960 (60 y.o. M) Treating RN: Carlene Coria Primary Care Provider: Halina Maidens Other Clinician: Referring Provider: Halina Maidens Treating Provider/Extender: Skipper Cliche in Treatment: 15 Verbal / Phone Orders: No Diagnosis Coding ICD-10  Coding Code Description I29.798X Laceration without foreign body, right ankle, initial encounter L97.312 Non-pressure chronic ulcer of right ankle with fat layer exposed I10 Essential (primary) hypertension Follow-up Appointments o Return Appointment in 3 weeks. Bathing/ Shower/ Hygiene o May shower; gently cleanse wound with antibacterial soap, rinse and pat dry prior to dressing wounds Edema Control - Lymphedema / Segmental Compressive Device / Other o Elevate, Exercise Daily and Avoid Standing for Long Periods of Time. o Elevate legs to the level of the heart and pump ankles as often as possible o Elevate leg(s) parallel to the floor when sitting. Wound Treatment Wound #1 - Ankle Wound Laterality: Right, Lateral Cleanser: Soap and Water 3 x Per Week/30 Days Discharge Instructions: Gently cleanse wound with antibacterial soap, rinse and pat dry prior to dressing wounds Primary Dressing: Prisma 4.34 (in) (Generic) 3 x Per Week/30 Days Discharge Instructions: Moisten w/normal saline or sterile water; Cover wound as directed. Do not remove from wound bed. Secondary Dressing: Foam Dressing, 4x4 (in/in) 3 x Per Week/30 Days Secondary Dressing: Kerlix 4.5 x 4.1 (in/yd) 3 x Per Week/30 Days Discharge Instructions: Apply Kerlix 4.5 x 4.1 (in/yd) as instructed Secondary Dressing: coban 3 x Per Week/30 Days Electronic Signature(s) Signed: 10/01/2021 6:01:56 PM By: Worthy Keeler PA-C Signed: 10/07/2021 1:01:44 PM By: Carlene Coria RN Entered By: Carlene Coria on 10/01/2021 15:44:31 [name] ([id number]) -------------------------------------------------------------------------------- Problem List  Details Patient Name: [name], [name] Date of Service: 10/01/2021 3:00 PM Medical Record Number: [id number] Patient Account Number: [id number] Date of Birth/Sex: [birth year] (60 y.o. M) Treating RN: Carlene Coria Primary Care Provider: Halina Maidens Other Clinician: Referring  Provider: Halina Maidens Treating Provider/Extender: Skipper Cliche in Treatment: 15 Active Problems ICD-10 Encounter Code Description Active Date MDM Diagnosis S91.011A Laceration without foreign body, right ankle, initial encounter 06/18/2021 No Yes L97.312 Non-pressure chronic ulcer of right ankle with fat layer exposed 06/18/2021 No Yes I10 Essential (primary) hypertension 06/18/2021 No Yes Inactive Problems Resolved Problems Electronic Signature(s) Signed: 10/01/2021 2:54:23 PM By: Worthy Keeler PA-C Entered By: Worthy Keeler on 10/01/2021 14:54:23 [name] ([id number]) -------------------------------------------------------------------------------- Progress Note Details Patient Name: [name] Date of Service: 10/01/2021 3:00 PM Medical Record Number: [id number] Patient Account Number: [id number] Date of Birth/Sex: [birth year] (60 y.o. M) Treating RN: Carlene Coria Primary Care Provider: Halina Maidens Other Clinician: Referring Provider: Halina Maidens Treating Provider/Extender: Skipper Cliche in Treatment: 15 Subjective Chief Complaint Information obtained from Patient Right ankle laceration History of Present Illness (HPI) 06/14/2021 upon evaluation today patient appears to be doing somewhat poorly in regard to the wound on his right ankle region. This was an injury that apparently occurred at work according to what he is telling me today. He is seen with his wife in the office at this point. Nonetheless the patient appears to have a circular opening with necrotic tissue in the base of the wound. This does not appear to be doing nearly as good as I would hope. Fortunately there does not appear to be any signs of infection though he does have a significant amount of necrotic tissue this is going to take some time to heal. He also has previously had surgery in this area there is a lot of scar tissue that will again complicate things and extended  healing process. The patient does have hypertension otherwise no major medical problems. 06/25/2021 upon evaluation today patient presents for reevaluation here in our clinic he actually does seem to be doing better in regard to his wound on the right lateral ankle. He has been tolerating the dressing changes without complication. Fortunately I do not see any signs of infection which is great news. 07/02/2021 upon evaluation today patient appears to be doing about the same in regard to his wound. With that being said I do believe that this is cleaner there was a little bit of slough noted we have been using Iodoflex. I was able to see a little bit more granulation tissue starting to try to build up but I think the scar tissue here is going to be the biggest slowing factor with regard to healing. That was discussed with the patient and his wife today. Nonetheless I think we want to switch up things based on what I see at this point. 07/09/2021 upon evaluation today patient appears to be doing about the same in regard to his ankle ulcer. He has been tolerating the dressing changes without complication. Fortunately there does not appear to be any signs of active infection. No fevers, chills, nausea, vomiting, or diarrhea. 07/16/2021 upon evaluation today patient appears to be doing better to some degree in regard to his wound on ankle region. He actually showed some signs of new granulation growth which is actually a good thing. Fortunately there does not appear to be any evidence of active infection and wound did not appear to be too dry upon evaluation today which  is also good news. No fevers, chills, nausea, vomiting, or diarrhea. 07/23/2021 upon evaluation today patient appears to be doing a little better in regard to his wound. He has been tolerating the dressing changes without complication. Fortunately there does not appear to be any signs of active infection which is great news. No fever chills noted we  are still waiting on the results back from the TheraSkin investigation to see whether or not he qualifies for this treatment option. 07/30/2021 upon evaluation today patient appears to be doing a little better again in regard to his wound. He is showing signs of improvement which is great news. I am very pleased with where things stand. I do believe that honestly all things considered he is making good progress but were just not able to do anything as far as advanced modalities to speed this up as insurance has literally devout denied everything including a snap VAC, Apligraf, PuraPly, and TheraSkin. Again I am not really certain what else to do otherwise other than just try to continue along the path we are as far as getting this wound to heal. 08/06/2021 upon evaluation today patient appears to be doing well currently he is of peering to make progress slowly but nonetheless this is at least making progress at this point. There does not appear to be any signs of active infection at this time. No fevers, chills, nausea, vomiting, or diarrhea. With that being said I do think that we are headed in the right direction with what I am seeing currently. I do wish we could do the snap VAC but to be honest I think this is the best we can do with the collagen as it stands currently. 08/13/2021 upon evaluation today patient unfortunately appears to be doing a little bit more poorly in regard to the fact that he is having some pain on the side of his ankle. This does have me a little bit concerned this is on the edge of the wound. There is a very hard area under this that I presume to be more of a calcification although I cannot be certain this is not just a superficial bony protrusion. I do feel it in the base of the wound but again I assumed more calcification due to inflammation from prior surgery but again that may not actually be the case. I do think that we probably need to go forward with an x-ray at this  point to see if there is anything going on that could be a more significant issue here. Depending on the results of the x-ray we may be looking at an MRI as well. 08/17/2021 upon evaluation today patient appears to be doing well with regard to his ankle ulcer. This actually is showing some signs of improvement which is great news and overall very pleased in that regard. Fortunately there does not appear to be any signs of active infection which is great news as well. No fevers, chills, nausea, vomiting, or diarrhea. With that being said the x-ray showed some surgical abnormalities and along the lateral ankle as well there was some mention of an irregularity. With that being said there was nothing specifically to indicate osteomyelitis as a probable diagnosis here. With that being said I did discuss this with the patient and his wife today their opinion is to hold off on an MRI at this point especially since his wound seems to be showing signs of healing. Fortunately there is no evidence of active infection systemically at this  point. 09/03/2021 upon evaluation today patient appears to be doing well with regard to his wound. He has been tolerating the dressing changes without complication. Fortunately there does not appear to be any signs of active infection which is also great news. In general I am very happy with where we stand. 09/17/2021 upon evaluation today patient appears to be doing well currently in regard to his wound. He has been tolerating the dressing changes without complication. Fortunately there does not appear to be any evidence of active infection which is great news. No fevers, chills, nausea, [name], [name] ([id number]) vomiting, or diarrhea. 10/01/2021 upon evaluation today patient appears to be doing well with regard to his ankle ulcer. He has been tolerating the dressing changes without complication. Fortunately there does not appear to be any signs of infection which is great  and overall I am extremely pleased with where we stand today. No fevers, chills, nausea, vomiting, or diarrhea. Objective Constitutional Well-nourished and well-hydrated in no acute distress. Vitals Time Taken: 3:10 PM, Height: 66 in, Weight: 161 lbs, BMI: 26, Temperature: 98.4 F, Pulse: 74 bpm, Respiratory Rate: 18 breaths/min, Blood Pressure: 145/77 mmHg. Respiratory normal breathing without difficulty. Psychiatric this patient is able to make decisions and demonstrates good insight into disease process. Alert and Oriented x 3. pleasant and cooperative. General Notes: Upon inspection patient's wound bed showed signs of good granulation and epithelization at this point. I am very pleased with the overall size reduction as well and in general I think he is headed in the right direction. Integumentary (Hair, Skin) Wound #1 status is Open. Original cause of wound was Trauma. The date acquired was: 05/09/2021. The wound has been in treatment 15 weeks. The wound is located on the Right,Lateral Ankle. The wound measures 1.7cm length x 0.8cm width x 0.5cm depth; 1.068cm^2 area and 0.534cm^3 volume. There is Fat Layer (Subcutaneous Tissue) exposed. There is no tunneling or undermining noted. There is a medium amount of serosanguineous drainage noted. There is large (67-100%) red, pink, hyper - granulation within the wound bed. There is a small (1-33%) amount of necrotic tissue within the wound bed including Adherent Slough. Assessment Active Problems ICD-10 Laceration without foreign body, right ankle, initial encounter Non-pressure chronic ulcer of right ankle with fat layer exposed Essential (primary) hypertension Plan Follow-up Appointments: Return Appointment in 3 weeks. Bathing/ Shower/ Hygiene: May shower; gently cleanse wound with antibacterial soap, rinse and pat dry prior to dressing wounds Edema Control - Lymphedema / Segmental Compressive Device / Other: Elevate, Exercise Daily  and Avoid Standing for Long Periods of Time. Elevate legs to the level of the heart and pump ankles as often as possible Elevate leg(s) parallel to the floor when sitting. WOUND #1: - Ankle Wound Laterality: Right, Lateral Cleanser: Soap and Water 3 x Per Week/30 Days Discharge Instructions: Gently cleanse wound with antibacterial soap, rinse and pat dry prior to dressing wounds Primary Dressing: Prisma 4.34 (in) (Generic) 3 x Per Week/30 Days Discharge Instructions: Moisten w/normal saline or sterile water; Cover wound as directed. Do not remove from wound bed. Secondary Dressing: Foam Dressing, 4x4 (in/in) 3 x Per Week/30 Days Secondary Dressing: Kerlix 4.5 x 4.1 (in/yd) 3 x Per Week/30 Days [name], [name] ([id number]) Discharge Instructions: Apply Kerlix 4.5 x 4.1 (in/yd) as instructed Secondary Dressing: coban 3 x Per Week/30 Days 1. Would recommend that we go ahead and continue with the wound care measures as before and the patient is in agreement the plan. His wife is  doing a great job taking care of his wounds and I am very pleased in that regard. Subsequently I am going to suggest that we continue as such with the treatment plan and she will continue to change this every other day. 2. I am also can recommend the patient continue to monitor for any signs of redness or infection if anything changes he should let me know. We will see patient back for reevaluation in 3 weeks here in the clinic. If anything worsens or changes patient will contact our office for additional recommendations. Electronic Signature(s) Signed: 10/01/2021 5:52:43 PM By: Worthy Keeler PA-C Entered By: Worthy Keeler on 10/01/2021 17:52:42 [name] ([id number]) -------------------------------------------------------------------------------- SuperBill Details Patient Name: [name] Date of Service: 10/01/2021 Medical Record Number: [id number] Patient Account Number: [id number] Date of  Birth/Sex: Jun 06, 1961 (60 y.o. M) Treating RN: Carlene Coria Primary Care Provider: Halina Maidens Other Clinician: Referring Provider: Halina Maidens Treating Provider/Extender: Skipper Cliche in Treatment: 15 Diagnosis Coding ICD-10 Codes Code Description [phone] Laceration without foreign body, right ankle, initial encounter X50.722 Non-pressure chronic ulcer of right ankle with fat layer exposed Reading (primary) hypertension Facility Procedures CPT4 Code: [id number] Description: 99213 - WOUND CARE VISIT-LEV 3 EST PT Modifier: Quantity: 1 Physician Procedures CPT4 Code: [id number] Description: 98421 - WC PHYS LEVEL 3 - EST PT Modifier: Quantity: 1 CPT4 Code: Description: ICD-10 Diagnosis Description I31.281V Laceration without foreign body, right ankle, initial encounter W86.773 Non-pressure chronic ulcer of right ankle with fat layer exposed I10 Essential (primary) hypertension Modifier: Quantity: Electronic Signature(s) Signed: 10/01/2021 5:53:11 PM By: Worthy Keeler PA-C Entered By: Worthy Keeler on 10/01/2021 17:53:10

## 2021-10-15 ENCOUNTER — Ambulatory Visit: Payer: 59 | Admitting: Physician Assistant

## 2021-10-22 ENCOUNTER — Other Ambulatory Visit: Payer: Self-pay

## 2021-10-22 ENCOUNTER — Encounter: Payer: 59 | Admitting: Physician Assistant

## 2021-10-22 DIAGNOSIS — L97312 Non-pressure chronic ulcer of right ankle with fat layer exposed: Secondary | ICD-10-CM | POA: Diagnosis not present

## 2021-10-22 NOTE — Progress Notes (Addendum)
[name] ([id number]) Visit Report for 10/22/2021 Chief Complaint Document Details Patient Name: [name]. Date of Service: 10/22/2021 1:15 PM Medical Record Number: [id number] Patient Account Number: [id number] Date of Birth/Sex: [birth year] (60 y.o. M) Treating RN: Carlene Coria Primary Care Provider: Halina Maidens Other Clinician: Referring Provider: Halina Maidens Treating Provider/Extender: Skipper Cliche in Treatment: 18 Information Obtained from: Patient Chief Complaint Right ankle laceration Electronic Signature(s) Signed: 10/22/2021 1:06:53 PM By: Worthy Keeler PA-C Entered By: Worthy Keeler on 10/22/2021 13:06:53 [name] ([id number]) -------------------------------------------------------------------------------- HPI Details Patient Name: [name] Date of Service: 10/22/2021 1:15 PM Medical Record Number: [id number] Patient Account Number: [id number] Date of Birth/Sex: [birth year] (60 y.o. M) Treating RN: Carlene Coria Primary Care Provider: Halina Maidens Other Clinician: Referring Provider: Halina Maidens Treating Provider/Extender: Skipper Cliche in Treatment: 18 History of Present Illness HPI Description: 06/14/2021 upon evaluation today patient appears to be doing somewhat poorly in regard to the wound on his right ankle region. This was an injury that apparently occurred at work according to what he is telling me today. He is seen with his wife in the office at this point. Nonetheless the patient appears to have a circular opening with necrotic tissue in the base of the wound. This does not appear to be doing nearly as good as I would hope. Fortunately there does not appear to be any signs of infection though he does have a significant amount of necrotic tissue this is going to take some time to heal. He also has previously had surgery in this area there is a lot of scar tissue that will again complicate things and  extended healing process. The patient does have hypertension otherwise no major medical problems. 06/25/2021 upon evaluation today patient presents for reevaluation here in our clinic he actually does seem to be doing better in regard to his wound on the right lateral ankle. He has been tolerating the dressing changes without complication. Fortunately I do not see any signs of infection which is great news. 07/02/2021 upon evaluation today patient appears to be doing about the same in regard to his wound. With that being said I do believe that this is cleaner there was a little bit of slough noted we have been using Iodoflex. I was able to see a little bit more granulation tissue starting to try to build up but I think the scar tissue here is going to be the biggest slowing factor with regard to healing. That was discussed with the patient and his wife today. Nonetheless I think we want to switch up things based on what I see at this point. 07/09/2021 upon evaluation today patient appears to be doing about the same in regard to his ankle ulcer. He has been tolerating the dressing changes without complication. Fortunately there does not appear to be any signs of active infection. No fevers, chills, nausea, vomiting, or diarrhea. 07/16/2021 upon evaluation today patient appears to be doing better to some degree in regard to his wound on ankle region. He actually showed some signs of new granulation growth which is actually a good thing. Fortunately there does not appear to be any evidence of active infection and wound did not appear to be too dry upon evaluation today which is also good news. No fevers, chills, nausea, vomiting, or diarrhea. 07/23/2021 upon evaluation today patient appears to be doing a little better in regard to his wound. He has been tolerating the dressing changes without complication. Fortunately  there does not appear to be any signs of active infection which is great news. No fever chills  noted we are still waiting on the results back from the TheraSkin investigation to see whether or not he qualifies for this treatment option. 07/30/2021 upon evaluation today patient appears to be doing a little better again in regard to his wound. He is showing signs of improvement which is great news. I am very pleased with where things stand. I do believe that honestly all things considered he is making good progress but were just not able to do anything as far as advanced modalities to speed this up as insurance has literally devout denied everything including a snap VAC, Apligraf, PuraPly, and TheraSkin. Again I am not really certain what else to do otherwise other than just try to continue along the path we are as far as getting this wound to heal. 08/06/2021 upon evaluation today patient appears to be doing well currently he is of peering to make progress slowly but nonetheless this is at least making progress at this point. There does not appear to be any signs of active infection at this time. No fevers, chills, nausea, vomiting, or diarrhea. With that being said I do think that we are headed in the right direction with what I am seeing currently. I do wish we could do the snap VAC but to be honest I think this is the best we can do with the collagen as it stands currently. 08/13/2021 upon evaluation today patient unfortunately appears to be doing a little bit more poorly in regard to the fact that he is having some pain on the side of his ankle. This does have me a little bit concerned this is on the edge of the wound. There is a very hard area under this that I presume to be more of a calcification although I cannot be certain this is not just a superficial bony protrusion. I do feel it in the base of the wound but again I assumed more calcification due to inflammation from prior surgery but again that may not actually be the case. I do think that we probably need to go forward with an x-ray at  this point to see if there is anything going on that could be a more significant issue here. Depending on the results of the x-ray we may be looking at an MRI as well. 08/17/2021 upon evaluation today patient appears to be doing well with regard to his ankle ulcer. This actually is showing some signs of improvement which is great news and overall very pleased in that regard. Fortunately there does not appear to be any signs of active infection which is great news as well. No fevers, chills, nausea, vomiting, or diarrhea. With that being said the x-ray showed some surgical abnormalities and along the lateral ankle as well there was some mention of an irregularity. With that being said there was nothing specifically to indicate osteomyelitis as a probable diagnosis here. With that being said I did discuss this with the patient and his wife today their opinion is to hold off on an MRI at this point especially since his wound seems to be showing signs of healing. Fortunately there is no evidence of active infection systemically at this point. 09/03/2021 upon evaluation today patient appears to be doing well with regard to his wound. He has been tolerating the dressing changes without complication. Fortunately there does not appear to be any signs of active infection which  is also great news. In general I am very happy with where we stand. 09/17/2021 upon evaluation today patient appears to be doing well currently in regard to his wound. He has been tolerating the dressing changes without complication. Fortunately there does not appear to be any evidence of active infection which is great news. No fevers, chills, nausea, vomiting, or diarrhea. 10/01/2021 upon evaluation today patient appears to be doing well with regard to his ankle ulcer. He has been tolerating the dressing changes without complication. Fortunately there does not appear to be any signs of infection which is great and overall I am extremely  pleased with where we stand today. No fevers, chills, nausea, vomiting, or diarrhea. MINDY, BEHNKEN ([id number]) 10/22/2021 upon evaluation today patient appears to be doing well with regard to his wound on the ankle region. Fortunately there does not appear to be any signs of active infection at this time. I think that he has made significant improvement since he was last here and this is great news. Electronic Signature(s) Signed: 10/22/2021 6:30:48 PM By: Worthy Keeler PA-C Entered By: Worthy Keeler on 10/22/2021 18:30:48 [name] ([id number]) -------------------------------------------------------------------------------- Physical Exam Details Patient Name: [name] Date of Service: 10/22/2021 1:15 PM Medical Record Number: [id number] Patient Account Number: [id number] Date of Birth/Sex: [birth year] (60 y.o. M) Treating RN: Carlene Coria Primary Care Provider: Halina Maidens Other Clinician: Referring Provider: Halina Maidens Treating Provider/Extender: Skipper Cliche in Treatment: 4 Constitutional Well-nourished and well-hydrated in no acute distress. Respiratory normal breathing without difficulty. Psychiatric this patient is able to make decisions and demonstrates good insight into disease process. Alert and Oriented x 3. pleasant and cooperative. Notes Upon inspection patient's wound bed showed signs of good granulation and epithelization at this point. Fortunately there does not appear to be any signs of active infection systemically which is great news. Overall I am extremely happy with where we stand today. Electronic Signature(s) Signed: 10/22/2021 6:31:09 PM By: Worthy Keeler PA-C Entered By: Worthy Keeler on 10/22/2021 18:31:08 [name] ([id number]) -------------------------------------------------------------------------------- Physician Orders Details Patient Name: [name] Date of Service: 10/22/2021 1:15  PM Medical Record Number: [id number] Patient Account Number: [id number] Date of Birth/Sex: May 08, 1961 (60 y.o. M) Treating RN: Carlene Coria Primary Care Provider: Halina Maidens Other Clinician: Referring Provider: Halina Maidens Treating Provider/Extender: Skipper Cliche in Treatment: 72 Verbal / Phone Orders: No Diagnosis Coding ICD-10 Coding Code Description M09.470J Laceration without foreign body, right ankle, initial encounter L97.312 Non-pressure chronic ulcer of right ankle with fat layer exposed [place] (primary) hypertension Follow-up Appointments o Return Appointment in 3 weeks. Bathing/ Shower/ Hygiene o May shower; gently cleanse wound with antibacterial soap, rinse and pat dry prior to dressing wounds Edema Control - Lymphedema / Segmental Compressive Device / Other o Elevate, Exercise Daily and Avoid Standing for Long Periods of Time. o Elevate legs to the level of the heart and pump ankles as often as possible o Elevate leg(s) parallel to the floor when sitting. Wound Treatment Wound #1 - Ankle Wound Laterality: Right, Lateral Cleanser: Soap and Water 3 x Per Week/30 Days Discharge Instructions: Gently cleanse wound with antibacterial soap, rinse and pat dry prior to dressing wounds Primary Dressing: Prisma 4.34 (in) (Generic) 3 x Per Week/30 Days Discharge Instructions: Moisten w/normal saline or sterile water; Cover wound as directed. Do not remove from wound bed. Secondary Dressing: Foam Dressing, 4x4 (in/in) 3 x Per Week/30 Days Secondary Dressing: Kerlix 4.5 x 4.1 (in/yd)  3 x Per Week/30 Days Discharge Instructions: Apply Kerlix 4.5 x 4.1 (in/yd) as instructed Secondary Dressing: coban 3 x Per Week/30 Days Electronic Signature(s) Signed: 10/22/2021 7:05:42 PM By: Worthy Keeler PA-C Signed: 10/25/2021 6:56:11 PM By: Carlene Coria RN Entered By: Carlene Coria on 10/22/2021 13:54:33 [name]  ([id number]) -------------------------------------------------------------------------------- Problem List Details Patient Name: [name] Date of Service: 10/22/2021 1:15 PM Medical Record Number: [id number] Patient Account Number: [id number] Date of Birth/Sex: [birth year] (60 y.o. M) Treating RN: Carlene Coria Primary Care Provider: Halina Maidens Other Clinician: Referring Provider: Halina Maidens Treating Provider/Extender: Skipper Cliche in Treatment: 18 Active Problems ICD-10 Encounter Code Description Active Date MDM Diagnosis S91.011A Laceration without foreign body, right ankle, initial encounter 06/18/2021 No Yes L97.312 Non-pressure chronic ulcer of right ankle with fat layer exposed 06/18/2021 No Yes I10 Essential (primary) hypertension 06/18/2021 No Yes Inactive Problems Resolved Problems Electronic Signature(s) Signed: 10/22/2021 1:06:48 PM By: Worthy Keeler PA-C Entered By: Worthy Keeler on 10/22/2021 13:06:48 [name] ([id number]) -------------------------------------------------------------------------------- Progress Note Details Patient Name: [name] Date of Service: 10/22/2021 1:15 PM Medical Record Number: [id number] Patient Account Number: [id number] Date of Birth/Sex: [birth year] (60 y.o. M) Treating RN: Carlene Coria Primary Care Provider: Halina Maidens Other Clinician: Referring Provider: Halina Maidens Treating Provider/Extender: Skipper Cliche in Treatment: 18 Subjective Chief Complaint Information obtained from Patient Right ankle laceration History of Present Illness (HPI) 06/14/2021 upon evaluation today patient appears to be doing somewhat poorly in regard to the wound on his right ankle region. This was an injury that apparently occurred at work according to what he is telling me today. He is seen with his wife in the office at this point. Nonetheless the patient appears to have a circular opening with necrotic  tissue in the base of the wound. This does not appear to be doing nearly as good as I would hope. Fortunately there does not appear to be any signs of infection though he does have a significant amount of necrotic tissue this is going to take some time to heal. He also has previously had surgery in this area there is a lot of scar tissue that will again complicate things and extended healing process. The patient does have hypertension otherwise no major medical problems. 06/25/2021 upon evaluation today patient presents for reevaluation here in our clinic he actually does seem to be doing better in regard to his wound on the right lateral ankle. He has been tolerating the dressing changes without complication. Fortunately I do not see any signs of infection which is great news. 07/02/2021 upon evaluation today patient appears to be doing about the same in regard to his wound. With that being said I do believe that this is cleaner there was a little bit of slough noted we have been using Iodoflex. I was able to see a little bit more granulation tissue starting to try to build up but I think the scar tissue here is going to be the biggest slowing factor with regard to healing. That was discussed with the patient and his wife today. Nonetheless I think we want to switch up things based on what I see at this point. 07/09/2021 upon evaluation today patient appears to be doing about the same in regard to his ankle ulcer. He has been tolerating the dressing changes without complication. Fortunately there does not appear to be any signs of active infection. No fevers, chills, nausea, vomiting, or diarrhea. 07/16/2021 upon evaluation today patient  appears to be doing better to some degree in regard to his wound on ankle region. He actually showed some signs of new granulation growth which is actually a good thing. Fortunately there does not appear to be any evidence of active infection and wound did not appear to  be too dry upon evaluation today which is also good news. No fevers, chills, nausea, vomiting, or diarrhea. 07/23/2021 upon evaluation today patient appears to be doing a little better in regard to his wound. He has been tolerating the dressing changes without complication. Fortunately there does not appear to be any signs of active infection which is great news. No fever chills noted we are still waiting on the results back from the TheraSkin investigation to see whether or not he qualifies for this treatment option. 07/30/2021 upon evaluation today patient appears to be doing a little better again in regard to his wound. He is showing signs of improvement which is great news. I am very pleased with where things stand. I do believe that honestly all things considered he is making good progress but were just not able to do anything as far as advanced modalities to speed this up as insurance has literally devout denied everything including a snap VAC, Apligraf, PuraPly, and TheraSkin. Again I am not really certain what else to do otherwise other than just try to continue along the path we are as far as getting this wound to heal. 08/06/2021 upon evaluation today patient appears to be doing well currently he is of peering to make progress slowly but nonetheless this is at least making progress at this point. There does not appear to be any signs of active infection at this time. No fevers, chills, nausea, vomiting, or diarrhea. With that being said I do think that we are headed in the right direction with what I am seeing currently. I do wish we could do the snap VAC but to be honest I think this is the best we can do with the collagen as it stands currently. 08/13/2021 upon evaluation today patient unfortunately appears to be doing a little bit more poorly in regard to the fact that he is having some pain on the side of his ankle. This does have me a little bit concerned this is on the edge of the wound.  There is a very hard area under this that I presume to be more of a calcification although I cannot be certain this is not just a superficial bony protrusion. I do feel it in the base of the wound but again I assumed more calcification due to inflammation from prior surgery but again that may not actually be the case. I do think that we probably need to go forward with an x-ray at this point to see if there is anything going on that could be a more significant issue here. Depending on the results of the x-ray we may be looking at an MRI as well. 08/17/2021 upon evaluation today patient appears to be doing well with regard to his ankle ulcer. This actually is showing some signs of improvement which is great news and overall very pleased in that regard. Fortunately there does not appear to be any signs of active infection which is great news as well. No fevers, chills, nausea, vomiting, or diarrhea. With that being said the x-ray showed some surgical abnormalities and along the lateral ankle as well there was some mention of an irregularity. With that being said there was nothing specifically to  indicate osteomyelitis as a probable diagnosis here. With that being said I did discuss this with the patient and his wife today their opinion is to hold off on an MRI at this point especially since his wound seems to be showing signs of healing. Fortunately there is no evidence of active infection systemically at this point. 09/03/2021 upon evaluation today patient appears to be doing well with regard to his wound. He has been tolerating the dressing changes without complication. Fortunately there does not appear to be any signs of active infection which is also great news. In general I am very happy with where we stand. 09/17/2021 upon evaluation today patient appears to be doing well currently in regard to his wound. He has been tolerating the dressing changes without complication. Fortunately there does not  appear to be any evidence of active infection which is great news. No fevers, chills, nausea, Plouffe, Darrell G. ([id number]) vomiting, or diarrhea. 10/01/2021 upon evaluation today patient appears to be doing well with regard to his ankle ulcer. He has been tolerating the dressing changes without complication. Fortunately there does not appear to be any signs of infection which is great and overall I am extremely pleased with where we stand today. No fevers, chills, nausea, vomiting, or diarrhea. 10/22/2021 upon evaluation today patient appears to be doing well with regard to his wound on the ankle region. Fortunately there does not appear to be any signs of active infection at this time. I think that he has made significant improvement since he was last here and this is great news. Objective Constitutional Well-nourished and well-hydrated in no acute distress. Vitals Time Taken: 1:33 PM, Height: 66 in, Weight: 161 lbs, BMI: 26, Temperature: 98.2 F, Pulse: 57 bpm, Respiratory Rate: 16 breaths/min, Blood Pressure: 131/80 mmHg. Respiratory normal breathing without difficulty. Psychiatric this patient is able to make decisions and demonstrates good insight into disease process. Alert and Oriented x 3. pleasant and cooperative. General Notes: Upon inspection patient's wound bed showed signs of good granulation and epithelization at this point. Fortunately there does not appear to be any signs of active infection systemically which is great news. Overall I am extremely happy with where we stand today. Integumentary (Hair, Skin) Wound #1 status is Open. Original cause of wound was Trauma. The date acquired was: 05/09/2021. The wound has been in treatment 18 weeks. The wound is located on the Right,Lateral Ankle. The wound measures 0.4cm length x 0.6cm width x 0.2cm depth; 0.188cm^2 area and 0.038cm^3 volume. There is Fat Layer (Subcutaneous Tissue) exposed. There is no tunneling or undermining  noted. There is a medium amount of serosanguineous drainage noted. There is large (67-100%) red, pink granulation within the wound bed. There is a small (1-33%) amount of necrotic tissue within the wound bed including Adherent Slough. Assessment Active Problems ICD-10 Laceration without foreign body, right ankle, initial encounter Non-pressure chronic ulcer of right ankle with fat layer exposed Essential (primary) hypertension Plan Follow-up Appointments: Return Appointment in 3 weeks. Bathing/ Shower/ Hygiene: May shower; gently cleanse wound with antibacterial soap, rinse and pat dry prior to dressing wounds Edema Control - Lymphedema / Segmental Compressive Device / Other: Elevate, Exercise Daily and Avoid Standing for Long Periods of Time. Elevate legs to the level of the heart and pump ankles as often as possible Elevate leg(s) parallel to the floor when sitting. WOUND #1: - Ankle Wound Laterality: Right, Lateral Cleanser: Soap and Water 3 x Per Week/30 Days Discharge Instructions: Gently cleanse wound with antibacterial  soap, rinse and pat dry prior to dressing wounds Primary Dressing: Prisma 4.34 (in) (Generic) 3 x Per Week/30 Days HARVEST, STANCO ([id number]) Discharge Instructions: Moisten w/normal saline or sterile water; Cover wound as directed. Do not remove from wound bed. Secondary Dressing: Foam Dressing, 4x4 (in/in) 3 x Per Week/30 Days Secondary Dressing: Kerlix 4.5 x 4.1 (in/yd) 3 x Per Week/30 Days Discharge Instructions: Apply Kerlix 4.5 x 4.1 (in/yd) as instructed Secondary Dressing: coban 3 x Per Week/30 Days 1. Would recommend currently that we go ahead and continue with the wound care measures as before specifically with regard to the silver collagen which I think is doing a great job. 2. We will continue with the foam and behind as well which I think is also doing quite well for him. 3. I am also going to recommend that we have the patient continue to  monitor for any signs of infection though right now I think he is actually doing quite well. We will see patient back for reevaluation in 1 week here in the clinic. If anything worsens or changes patient will contact our office for additional recommendations. Electronic Signature(s) Signed: 10/22/2021 6:32:04 PM By: Worthy Keeler PA-C Entered By: Worthy Keeler on 10/22/2021 18:32:04 [name] ([id number]) -------------------------------------------------------------------------------- SuperBill Details Patient Name: [name] Date of Service: 10/22/2021 Medical Record Number: [id number] Patient Account Number: [id number] Date of Birth/Sex: 06-Sep-1961 (60 y.o. M) Treating RN: Carlene Coria Primary Care Provider: Halina Maidens Other Clinician: Referring Provider: Halina Maidens Treating Provider/Extender: Skipper Cliche in Treatment: 18 Diagnosis Coding ICD-10 Codes Code Description S91.011A Laceration without foreign body, right ankle, initial encounter Y86.578 Non-pressure chronic ulcer of right ankle with fat layer exposed [place] ([facility] Procedures CPT4 Code: 46962952 Description: 99213 - WOUND CARE VISIT-LEV 3 EST PT Modifier: Quantity: 1 Physician Procedures CPT4 Code: 8413244 Description: 01027 - WC PHYS LEVEL 3 - EST PT Modifier: Quantity: 1 CPT4 Code: Description: ICD-10 Diagnosis Description O53.664Q Laceration without foreign body, right ankle, initial encounter 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

## 2021-10-25 NOTE — Progress Notes (Addendum)
[name], [name] ([id number]) Visit Report for 10/22/2021 Arrival Information Details Patient Name: [name], [name] Date of Service: 10/22/2021 1:15 PM Medical Record Number: [id number] Patient Account Number: [id number] Date of Birth/Sex: Oct 15, 1961 (60 y.o. M) Treating RN: Carlene Coria Primary Care Diannie Willner: Halina Maidens Other Clinician: Referring Jaydi Bray: Halina Maidens Treating Catriona Dillenbeck/Extender: Skipper Cliche in Treatment: 77 Visit Information History Since Last Visit All ordered tests and consults were completed: No Patient Arrived: Ambulatory Added or deleted any medications: No Arrival Time: 13:29 Any new allergies or adverse reactions: No Accompanied By: wife Had a fall or experienced change in No Transfer Assistance: None activities of daily living that may affect Patient Identification Verified: Yes risk of falls: Secondary Verification Process Completed: Yes Signs or symptoms of abuse/neglect since last visito No Patient Requires Transmission-Based Precautions: No Hospitalized since last visit: No Patient Has Alerts: Yes Implantable device outside of the clinic excluding No Patient Alerts: NOT diabetic cellular tissue based products placed in the center since last visit: Has Dressing in Place as Prescribed: Yes Pain Present Now: No Electronic Signature(s) Signed: 10/25/2021 6:56:11 PM By: Carlene Coria RN Entered By: Carlene Coria on 10/22/2021 13:32:44 [name] ([id number]) -------------------------------------------------------------------------------- Clinic Level of Care Assessment Details Patient Name: [name] Date of Service: 10/22/2021 1:15 PM Medical Record Number: [id number] Patient Account Number: [id number] Date of Birth/Sex: [birth year] (60 y.o. M) Treating RN: Carlene Coria Primary Care Bryden Darden: Halina Maidens Other Clinician: Referring Percilla Tweten: Halina Maidens Treating Aylana Hirschfeld/Extender: Skipper Cliche in  Treatment: 18 Clinic Level of Care Assessment Items TOOL 4 Quantity Score X - Use when only an EandM is performed on FOLLOW-UP visit 1 0 ASSESSMENTS - Nursing Assessment / Reassessment X - Reassessment of Co-morbidities (includes updates in patient status) 1 10 X- 1 5 Reassessment of Adherence to Treatment Plan ASSESSMENTS - Wound and Skin Assessment / Reassessment X - Simple Wound Assessment / Reassessment - one wound 1 5 _0  - 0 Complex Wound Assessment / Reassessment - multiple wounds _1  - 0 Dermatologic / Skin Assessment (not related to wound area) ASSESSMENTS - Focused Assessment _2  - Circumferential Edema Measurements - multi extremities 0 _3  - 0 Nutritional Assessment / Counseling / Intervention _4  - 0 Lower Extremity Assessment (monofilament, tuning fork, pulses) _5  - 0 Peripheral Arterial Disease Assessment (using hand held doppler) ASSESSMENTS - Ostomy and/or Continence Assessment and Care _6  - Incontinence Assessment and Management 0 _7  - 0 Ostomy Care Assessment and Management (repouching, etc.) PROCESS - Coordination of Care X - Simple Patient / Family Education for ongoing care 1 15 _8  - 0 Complex (extensive) Patient / Family Education for ongoing care _9  - 0 Staff obtains Programmer, systems, Records, Test Results / Process Orders _10  - 0 Staff telephones HHA, Nursing Homes / Clarify orders / etc _11  - 0 Routine Transfer to another Facility (non-emergent condition) _12  - 0 Routine Hospital Admission (non-emergent condition) _13  - 0 New Admissions / Biomedical engineer / Ordering NPWT, Apligraf, etc. _14  - 0 Emergency Hospital Admission (emergent condition) X- 1 10 Simple Discharge Coordination _15  - 0 Complex (extensive) Discharge Coordination PROCESS - Special Needs _16  - Pediatric / Minor Patient Management 0 _17  - 0 Isolation Patient Management _18  - 0 Hearing / Language / Visual special needs _19  - 0 Assessment of Community assistance (transportation, D/C  planning, etc.) _20  - 0 Additional assistance / Altered mentation _21  - 0 Support Surface(s) Assessment (bed, cushion, seat, etc.) INTERVENTIONS - Wound Cleansing / Measurement [name], [name]. ([id number]) X- 1 5  Simple Wound Cleansing - one wound _0  - 0 Complex Wound Cleansing - multiple wounds X- 1 5 Wound Imaging (photographs - any number of wounds) _1  - 0 Wound Tracing (instead of photographs) X- 1 5 Simple Wound Measurement - one wound _2  - 0 Complex Wound Measurement - multiple wounds INTERVENTIONS - Wound Dressings X - Small Wound Dressing one or multiple wounds 1 10 _3  - 0 Medium Wound Dressing one or multiple wounds _4  - 0 Large Wound Dressing one or multiple wounds X- 1 5 Application of Medications - topical <ZOXWRUEAVWUJWJXB>_1<\/YNWGNFAOZHYQMVHQ>_4  - 0 Application of Medications - injection INTERVENTIONS - Miscellaneous _6  - External ear exam 0 _7  - 0 Specimen Collection (cultures, biopsies, blood, body fluids, etc.) _8  - 0 Specimen(s) / Culture(s) sent or taken to Lab for analysis _9  - 0 Patient Transfer (multiple staff / Civil Service fast streamer / Similar devices) _10  - 0 Simple Staple / Suture removal (25 or less) _11  - 0 Complex Staple / Suture removal (26 or more) _12  - 0 Hypo / Hyperglycemic Management (close monitor of Blood Glucose) _13  - 0 Ankle / Brachial Index (ABI) - do not check if billed separately X- 1 5 Vital Signs Has the patient been seen at the hospital within the last three years: Yes Total Score: 80 Level Of Care: New/Established - Level 3 Electronic Signature(s) Signed: 10/25/2021 6:56:11 PM By: Carlene Coria RN Entered By: Carlene Coria on 10/22/2021 13:55:13 [name] ([id number]) -------------------------------------------------------------------------------- Encounter Discharge Information Details Patient Name: [name] Date of Service: 10/22/2021 1:15 PM Medical Record Number: [id number] Patient Account Number: [id number] Date of Birth/Sex: 1961-01-12 (60 y.o.  M) Treating RN: Carlene Coria Primary Care Ziah Leandro: Halina Maidens Other Clinician: Referring Kirti Carl: Halina Maidens Treating Talya Quain/Extender: Skipper Cliche in Treatment: 18 Encounter Discharge Information Items Discharge Condition: Stable Ambulatory Status: Ambulatory Discharge Destination: Home Transportation: Private Auto Accompanied By: wife Schedule Follow-up Appointment: Yes Clinical Summary of Care: Patient Declined Electronic Signature(s) Signed: 10/25/2021 6:56:11 PM By: Carlene Coria RN Entered By: Carlene Coria on 10/22/2021 14:01:52 [name] ([id number]) -------------------------------------------------------------------------------- Lower Extremity Assessment Details Patient Name: [name] Date of Service: 10/22/2021 1:15 PM Medical Record Number: [id number] Patient Account Number: [id number] Date of Birth/Sex: 22-Sep-1961 (60 y.o. M) Treating RN: Carlene Coria Primary Care Baruch Lewers: Halina Maidens Other Clinician: Referring Posey Jasmin: Halina Maidens Treating Ananiah Maciolek/Extender: Jeri Cos Weeks in Treatment: 18 Vascular Assessment Pulses: Dorsalis Pedis Palpable: [Left:Yes] Electronic Signature(s) Signed: 10/25/2021 6:56:11 PM By: Carlene Coria RN Entered By: Carlene Coria on 10/22/2021 13:40:23 [name] ([id number]) -------------------------------------------------------------------------------- Multi Wound Chart Details Patient Name: [name] Date of Service: 10/22/2021 1:15 PM Medical Record Number: [id number] Patient Account Number: [id number] Date of Birth/Sex: 10/28/1961 (60 y.o. M) Treating RN: Carlene Coria Primary Care Frady Taddeo: Halina Maidens Other Clinician: Referring Kianna Billet: Halina Maidens Treating Keyden Pavlov/Extender: Skipper Cliche in Treatment: 18 Vital Signs Height(in): 66 Pulse(bpm): 26 Weight(lbs): 161 Blood Pressure(mmHg): 131/80 Body Mass Index(BMI): 26 Temperature(F):  98.2 Respiratory Rate(breaths/min): 16 Photos: [N/A:N/A] Wound Location: Right, Lateral Ankle N/A N/A Wounding Event: Trauma N/A N/A Primary Etiology: Trauma, Other N/A N/A Comorbid History: Hypertension N/A N/A Date Acquired: 05/09/2021 N/A N/A Weeks of Treatment: 18 N/A N/A Wound Status: Open N/A N/A Measurements L x W x D (cm) 0.4x0.6x0.2 N/A N/A Area (cm) : 0.188 N/A N/A Volume (cm) : 0.038 N/A N/A % Reduction in Area: 76.10% N/A N/A % Reduction in Volume: 90.30% N/A N/A Classification: Full Thickness Without Exposed N/A N/A Support Structures Exudate Amount: Medium N/A  N/A Exudate Type: Serosanguineous N/A N/A Exudate Color: red, brown N/A N/A Granulation Amount: Large (67-100%) N/A N/A Granulation Quality: Red, Pink N/A N/A Necrotic Amount: Small (1-33%) N/A N/A Exposed Structures: Fat Layer (Subcutaneous Tissue): N/A N/A Yes Fascia: No Tendon: No Muscle: No Joint: No Bone: No Epithelialization: None N/A N/A Treatment Notes Electronic Signature(s) Signed: 10/25/2021 6:56:11 PM By: Carlene Coria RN Entered By: Carlene Coria on 10/22/2021 13:52:53 [name] ([id number]) -------------------------------------------------------------------------------- Multi-Disciplinary Care Plan Details Patient Name: [name] Date of Service: 10/22/2021 1:15 PM Medical Record Number: [id number] Patient Account Number: [id number] Date of Birth/Sex: 1961/04/10 (60 y.o. M) Treating RN: Carlene Coria Primary Care Analynn Daum: Halina Maidens Other Clinician: Referring Eladia Frame: Halina Maidens Treating Aleeyah Bensen/Extender: Skipper Cliche in Treatment: 18 Active Inactive Wound/Skin Impairment Nursing Diagnoses: Knowledge deficit related to ulceration/compromised skin integrity Goals: Patient/caregiver will verbalize understanding of skin care regimen Date Initiated: 06/18/2021 Target Resolution Date: 09/18/2021 Goal Status: Active Ulcer/skin breakdown will have a  volume reduction of 30% by week 4 Date Initiated: 06/18/2021 Date Inactivated: 07/16/2021 Target Resolution Date: 07/18/2021 Goal Status: Unmet Unmet Reason: comorbities Ulcer/skin breakdown will have a volume reduction of 50% by week 8 Date Initiated: 06/18/2021 Date Inactivated: 08/20/2021 Target Resolution Date: 08/18/2021 Goal Status: Unmet Unmet Reason: comorbites Ulcer/skin breakdown will have a volume reduction of 80% by week 12 Date Initiated: 06/18/2021 Date Inactivated: 10/01/2021 Target Resolution Date: 09/18/2021 Goal Status: Met Ulcer/skin breakdown will heal within 14 weeks Date Initiated: 06/18/2021 Target Resolution Date: 10/18/2021 Goal Status: Active Interventions: Assess patient/caregiver ability to obtain necessary supplies Assess patient/caregiver ability to perform ulcer/skin care regimen upon admission and as needed Assess ulceration(s) every visit Notes: Electronic Signature(s) Signed: 10/25/2021 6:56:11 PM By: Carlene Coria RN Entered By: Carlene Coria on 10/22/2021 13:52:39 [name] ([id number]) -------------------------------------------------------------------------------- Pain Assessment Details Patient Name: [name] Date of Service: 10/22/2021 1:15 PM Medical Record Number: [id number] Patient Account Number: [id number] Date of Birth/Sex: [birth year] (60 y.o. M) Treating RN: Carlene Coria Primary Care Madilynn Montante: Halina Maidens Other Clinician: Referring Willine Schwalbe: Halina Maidens Treating Makaiya Geerdes/Extender: Skipper Cliche in Treatment: 18 Active Problems Location of Pain Severity and Description of Pain Patient Has Paino No Site Locations Pain Management and Medication Current Pain Management: Electronic Signature(s) Signed: 10/25/2021 6:56:11 PM By: Carlene Coria RN Entered By: Carlene Coria on 10/22/2021 13:33:32 [name]  ([id number]) -------------------------------------------------------------------------------- Patient/Caregiver Education Details Patient Name: [name] Date of Service: 10/22/2021 1:15 PM Medical Record Number: [id number] Patient Account Number: [id number] Date of Birth/Gender: 04-08-1961 (60 y.o. M) Treating RN: Carlene Coria Primary Care Physician: Halina Maidens Other Clinician: Referring Physician: Halina Maidens Treating Physician/Extender: Skipper Cliche in Treatment: 18 Education Assessment Education Provided To: Patient Education Topics Provided Wound/Skin Impairment: Methods: Explain/Verbal Responses: State content correctly Electronic Signature(s) Signed: 10/25/2021 6:56:11 PM By: Carlene Coria RN Entered By: Carlene Coria on 10/22/2021 13:55:29 [name] ([id number]) -------------------------------------------------------------------------------- Wound Assessment Details Patient Name: [name] Date of Service: 10/22/2021 1:15 PM Medical Record Number: [id number] Patient Account Number: [id number] Date of Birth/Sex: 10-16-1961 (60 y.o. M) Treating RN: Carlene Coria Primary Care Klever Twyford: Halina Maidens Other Clinician: Referring Jackie Russman: Halina Maidens Treating Ragan Reale/Extender: Jeri Cos Weeks in Treatment: 18 Wound Status Wound Number: 1 Primary Etiology: Trauma, Other Wound Location: Right, Lateral Ankle Wound Status: Open Wounding Event: Trauma Comorbid History: Hypertension Date Acquired: 05/09/2021 Weeks Of Treatment: 18 Clustered Wound: No Photos Wound Measurements Length: (cm) 0.4 Width: (cm) 0.6 Depth: (cm) 0.2 Area: (cm) 0.188 Volume: (cm)  0.038 % Reduction in Area: 76.1% % Reduction in Volume: 90.3% Epithelialization: None Tunneling: No Undermining: No Wound Description Classification: Full Thickness Without Exposed Support Structures Exudate Amount: Medium Exudate Type: Serosanguineous Exudate Color:  red, brown Foul Odor After Cleansing: No Slough/Fibrino Yes Wound Bed Granulation Amount: Large (67-100%) Exposed Structure Granulation Quality: Red, Pink Fascia Exposed: No Necrotic Amount: Small (1-33%) Fat Layer (Subcutaneous Tissue) Exposed: Yes Necrotic Quality: Adherent Slough Tendon Exposed: No Muscle Exposed: No Joint Exposed: No Bone Exposed: No Treatment Notes Wound #1 (Ankle) Wound Laterality: Right, Lateral Cleanser Soap and Water Discharge Instruction: Gently cleanse wound with antibacterial soap, rinse and pat dry prior to dressing wounds Peri-Wound Care [name], [name] ([id number]) Topical Primary Dressing Prisma 4.34 (in) Discharge Instruction: Moisten w/normal saline or sterile water; Cover wound as directed. Do not remove from wound bed. Secondary Dressing Foam Dressing, 4x4 (in/in) Kerlix 4.5 x 4.1 (in/yd) Discharge Instruction: Apply Kerlix 4.5 x 4.1 (in/yd) as instructed coban Secured With Compression Wrap Compression Stockings Add-Ons Electronic Signature(s) Signed: 10/25/2021 6:56:11 PM By: Carlene Coria RN Entered By: Carlene Coria on 10/22/2021 13:39:53 [name] ([id number]) -------------------------------------------------------------------------------- [place] Details Patient Name: [name] Date of Service: 10/22/2021 1:15 PM Medical Record Number: [id number] Patient Account Number: [id number] Date of Birth/Sex: Apr 22, 1961 (60 y.o. M) Treating RN: Carlene Coria Primary Care Garry Nicolini: Halina Maidens Other Clinician: Referring Jayziah Bankhead: Halina Maidens Treating Jolly Carlini/Extender: Skipper Cliche in Treatment: 18 Vital Signs Time Taken: 13:33 Temperature (F): 98.2 Height (in): 66 Pulse (bpm): 57 Weight (lbs): 161 Respiratory Rate (breaths/min): 16 Body Mass Index (BMI): 26 Blood Pressure (mmHg): 131/80 Reference Range: 80 - 120 mg / dl Electronic Signature(s) Signed: 10/25/2021 6:56:11 PM By: Carlene Coria  RN Entered By: Carlene Coria on 10/22/2021 13:33:26

## 2021-10-29 ENCOUNTER — Other Ambulatory Visit: Payer: Self-pay

## 2021-10-29 ENCOUNTER — Encounter: Payer: Self-pay | Admitting: Internal Medicine

## 2021-10-29 ENCOUNTER — Ambulatory Visit (INDEPENDENT_AMBULATORY_CARE_PROVIDER_SITE_OTHER): Payer: 59 | Admitting: Internal Medicine

## 2021-10-29 ENCOUNTER — Ambulatory Visit: Payer: 59 | Admitting: Physician Assistant

## 2021-10-29 VITALS — BP 122/78 | HR 63 | Ht 66.0 in | Wt 162.0 lb

## 2021-10-29 DIAGNOSIS — C61 Malignant neoplasm of prostate: Secondary | ICD-10-CM

## 2021-10-29 DIAGNOSIS — Z0000 Encounter for general adult medical examination without abnormal findings: Secondary | ICD-10-CM

## 2021-10-29 DIAGNOSIS — R7303 Prediabetes: Secondary | ICD-10-CM

## 2021-10-29 DIAGNOSIS — Z1211 Encounter for screening for malignant neoplasm of colon: Secondary | ICD-10-CM | POA: Diagnosis not present

## 2021-10-29 DIAGNOSIS — E782 Mixed hyperlipidemia: Secondary | ICD-10-CM | POA: Diagnosis not present

## 2021-10-29 DIAGNOSIS — I10 Essential (primary) hypertension: Secondary | ICD-10-CM

## 2021-10-29 MED ORDER — ATORVASTATIN CALCIUM 10 MG PO TABS: ORAL_TABLET | ORAL | 3 refills | Status: DC

## 2021-10-29 MED ORDER — OLMESARTAN MEDOXOMIL 20 MG PO TABS: 20.0000 mg | ORAL_TABLET | Freq: Every day | ORAL | 3 refills | Status: DC

## 2021-10-29 NOTE — Progress Notes (Signed)
Date:  10/29/2021   Name:  [name]   DOB:  [birth year]   MRN:  [id number]   Chief Complaint: Annual Exam [name] is a 60 y.o. male who presents today for his Complete Annual Exam. He feels well. He reports not exercising. He reports he is sleeping well.   Colonoscopy: FIT 01/2020 neg  Immunization History  Administered Date(s) Administered   Influenza-Unspecified 09/15/2017, 09/15/2020, 10/09/2021   Moderna Sars-Covid-2 Vaccination 07/26/2020, 08/26/2020    Hypertension This is a chronic problem. The problem is controlled. Pertinent negatives include no chest pain, headaches, palpitations or shortness of breath. Past treatments include angiotensin blockers. The current treatment provides significant improvement. There is no history of kidney disease, CAD/MI or CVA.  Hyperlipidemia This is a chronic problem. The problem is controlled. Pertinent negatives include no chest pain, myalgias or shortness of breath. Current antihyperlipidemic treatment includes statins. The current treatment provides moderate improvement of lipids.   Lab Results  Component Value Date   CREATININE 1.05 10/23/2020   BUN 16 10/23/2020   NA 142 10/23/2020   K 4.9 10/23/2020   CL 103 10/23/2020   CO2 23 10/23/2020   Lab Results  Component Value Date   CHOL 201 (H) 10/23/2020   HDL 41 10/23/2020   LDLCALC 131 (H) 10/23/2020   TRIG 164 (H) 10/23/2020   CHOLHDL 4.9 10/23/2020   Lab Results  Component Value Date   TSH 26.900 (H) 10/23/2020   Lab Results  Component Value Date   HGBA1C 6.3 (H) 10/23/2020   Lab Results  Component Value Date   WBC 7.8 10/23/2020   HGB 17.0 10/23/2020   HCT 51.2 (H) 10/23/2020   MCV 89 10/23/2020   PLT 258 10/23/2020   Lab Results  Component Value Date   ALT 40 10/23/2020   AST 23 10/23/2020   ALKPHOS 81 10/23/2020   BILITOT 0.3 10/23/2020     Review of Systems  Constitutional:  Negative for appetite change, chills, diaphoresis, fatigue  and unexpected weight change.  HENT:  Negative for hearing loss, trouble swallowing and voice change.   Eyes:  Negative for visual disturbance.  Respiratory:  Negative for choking, shortness of breath and wheezing.   Cardiovascular:  Negative for chest pain, palpitations and leg swelling.  Gastrointestinal:  Negative for abdominal pain, blood in stool, constipation and diarrhea.  Genitourinary:  Negative for difficulty urinating, dysuria and frequency.  Musculoskeletal:  Negative for arthralgias, back pain and myalgias.  Skin:  Positive for wound (right ankle - healing). Negative for color change and rash.  Neurological:  Negative for dizziness, syncope and headaches.  Hematological:  Negative for adenopathy.  Psychiatric/Behavioral:  Negative for dysphoric mood and sleep disturbance. The patient is not nervous/anxious.    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  Current Meds  Medication Sig   [DISCONTINUED] atorvastatin (LIPITOR) 10 MG tablet TAKE 1 TABLET(10 MG) BY MOUTH DAILY   [DISCONTINUED] olmesartan (BENICAR) 20 MG tablet Take 1 tablet (20 mg total) by mouth  daily.    PHQ 2/9 Scores 10/29/2021 10/23/2020 10/16/2019 01/28/2019  PHQ - 2 Score 0 0 0 0  PHQ- 9 Score 0 0 - -    GAD 7 : Generalized Anxiety Score 10/29/2021 10/23/2020  Nervous, Anxious, on Edge 0 0  Control/stop worrying 0 0  Worry too much - different things 0 0  Trouble relaxing 0 0  Restless 0 0  Easily annoyed or irritable 0 0  Afraid - awful might happen 0 0  Total GAD 7 Score 0 0  Anxiety Difficulty Not difficult at all Not difficult at all    BP Readings from Last 3 Encounters:  10/29/21 122/78  06/12/21 140/83  10/23/20 118/70    Physical Exam Vitals and nursing note reviewed.  Constitutional:      Appearance: Normal appearance. He is well-developed.  HENT:     Head: Normocephalic.     Right Ear: Tympanic membrane, ear canal and external ear normal.     Left Ear: Tympanic membrane, ear canal and external ear normal.     Nose: Nose normal.  Eyes:     Conjunctiva/sclera: Conjunctivae normal.     Pupils: Pupils are equal, round, and reactive to light.  Neck:     Thyroid: No thyromegaly.     Vascular: No carotid bruit.  Cardiovascular:     Rate and Rhythm: Normal rate and regular rhythm.     Heart sounds: Normal heart sounds.  Pulmonary:     Effort: Pulmonary effort is normal.     Breath sounds: Normal breath sounds. No wheezing.  Chest:  Breasts:    Right: No mass.     Left: No mass.  Abdominal:     General: Bowel sounds are normal.     Palpations: Abdomen is soft.     Tenderness: There is no abdominal tenderness.  Musculoskeletal:        General: Normal range of motion.     Cervical back: Normal range of motion and neck supple.  Lymphadenopathy:     Cervical: No cervical adenopathy.  Skin:    General: Skin is warm and dry.     Comments: Dressing intact on right ankle - not examined  Neurological:     Mental Status: He is alert and oriented to person, place, and time.     Deep Tendon Reflexes: Reflexes are normal and symmetric.  Psychiatric:         Attention and Perception: Attention normal.        Mood and Affect: Mood normal.        Thought Content: Thought content normal.    Wt Readings from Last 3 Encounters:  10/29/21 162 lb (73.5 kg)  06/12/21 162 lb (73.5 kg)  10/23/20 163 lb (73.9 kg)    BP 122/78   Pulse 63   Ht 5\' 6"  (1.676 m)   Wt 162 lb (73.5 kg)   SpO2 99%   BMI 26.15 kg/m   Assessment and Plan: 1. Annual physical exam Normal exam Up to date  on screenings and immunizations.  2. Colon cancer screening Repeat yearly - Fecal occult blood, imunochemical  3. Essential hypertension Clinically stable exam with well controlled BP. Tolerating medications without side effects at this time. Pt to continue current regimen and low sodium diet; benefits of regular exercise as able discussed. - CBC with Differential/Platelet - Comprehensive metabolic panel - olmesartan (BENICAR) 20 MG tablet; Take 1 tablet (20 mg total) by mouth daily.  Dispense: 90 tablet; Refill: 3  4. Hyperlipidemia, mixed Tolerating statin medication without side effects at this time Continue same therapy without change at this time. - Comprehensive metabolic panel - Lipid panel - atorvastatin (LIPITOR) 10 MG tablet; TAKE 1 TABLET(10 MG) BY MOUTH DAILY  Dispense: 90 tablet; Refill: 3  5. Prediabetes Weight is stable; no s/s of progression - Hemoglobin A1c  6. Prostate cancer [facility]) DRE deferred - he is overdue for Urology follow up but is hesitant to go at this time. Will obtain PSA - PSA   Partially dictated using Dragon software. Any errors are unintentional.  Halina Maidens, MD [facility]  10/29/2021

## 2021-10-30 LAB — COMPREHENSIVE METABOLIC PANEL
ALT: 41 IU/L (ref 0–44)
AST: 29 IU/L (ref 0–40)
Albumin/Globulin Ratio: 1.8 (ref 1.2–2.2)
Albumin: 4.9 g/dL — ABNORMAL HIGH (ref 3.8–4.8)
Alkaline Phosphatase: 64 IU/L (ref 44–121)
BUN/Creatinine Ratio: 15 (ref 10–24)
BUN: 16 mg/dL (ref 8–27)
Bilirubin Total: 0.5 mg/dL (ref 0.0–1.2)
CO2: 24 mmol/L (ref 20–29)
Calcium: 9.7 mg/dL (ref 8.6–10.2)
Chloride: 104 mmol/L (ref 96–106)
Creatinine, Ser: 1.06 mg/dL (ref 0.76–1.27)
Globulin, Total: 2.7 g/dL (ref 1.5–4.5)
Glucose: 104 mg/dL — ABNORMAL HIGH (ref 70–99)
Potassium: 4.7 mmol/L (ref 3.5–5.2)
Sodium: 142 mmol/L (ref 134–144)
Total Protein: 7.6 g/dL (ref 6.0–8.5)
eGFR: 79 mL/min/{1.73_m2} (ref >59)

## 2021-10-30 LAB — LIPID PANEL
Chol/HDL Ratio: 4.4 ratio (ref 0.0–5.0)
Cholesterol, Total: 188 mg/dL (ref 100–199)
HDL: 43 mg/dL (ref >39)
LDL Chol Calc (NIH): 119 mg/dL — ABNORMAL HIGH (ref 0–99)
Triglycerides: 148 mg/dL (ref 0–149)
VLDL Cholesterol Cal: 26 mg/dL (ref 5–40)

## 2021-10-30 LAB — CBC WITH DIFFERENTIAL/PLATELET
Basophils Absolute: 0.1 10*3/uL (ref 0.0–0.2)
Basos: 1 %
EOS (ABSOLUTE): 0.3 10*3/uL (ref 0.0–0.4)
Eos: 4 %
Hematocrit: 46.2 % (ref 37.5–51.0)
Hemoglobin: 15.6 g/dL (ref 13.0–17.7)
Immature Grans (Abs): 0 10*3/uL (ref 0.0–0.1)
Immature Granulocytes: 0 %
Lymphocytes Absolute: 2.8 10*3/uL (ref 0.7–3.1)
Lymphs: 35 %
MCH: 29.7 pg (ref 26.6–33.0)
MCHC: 33.8 g/dL (ref 31.5–35.7)
MCV: 88 fL (ref 79–97)
Monocytes Absolute: 0.6 10*3/uL (ref 0.1–0.9)
Monocytes: 8 %
Neutrophils Absolute: 4.2 10*3/uL (ref 1.4–7.0)
Neutrophils: 52 %
Platelets: 261 10*3/uL (ref 150–450)
RBC: 5.26 x10E6/uL (ref 4.14–5.80)
RDW: 13.2 % (ref 11.6–15.4)
WBC: 8 10*3/uL (ref 3.4–10.8)

## 2021-10-30 LAB — HEMOGLOBIN A1C
Est. average glucose Bld gHb Est-mCnc: 140 mg/dL
Hgb A1c MFr Bld: 6.5 % — ABNORMAL HIGH (ref 4.8–5.6)

## 2021-10-30 LAB — PSA: Prostate Specific Ag, Serum: 5 ng/mL — ABNORMAL HIGH (ref 0.0–4.0)

## 2021-11-12 ENCOUNTER — Encounter: Payer: 59 | Attending: Physician Assistant | Admitting: Physician Assistant

## 2021-11-12 ENCOUNTER — Other Ambulatory Visit: Payer: Self-pay

## 2021-11-12 DIAGNOSIS — S91011A Laceration without foreign body, right ankle, initial encounter: Secondary | ICD-10-CM | POA: Diagnosis not present

## 2021-11-12 DIAGNOSIS — Y990 Civilian activity done for income or pay: Secondary | ICD-10-CM | POA: Insufficient documentation

## 2021-11-12 DIAGNOSIS — L97312 Non-pressure chronic ulcer of right ankle with fat layer exposed: Secondary | ICD-10-CM | POA: Diagnosis not present

## 2021-11-12 NOTE — Progress Notes (Addendum)
[name], [name] ([id number]) Visit Report for 11/12/2021 Chief Complaint Document Details Patient Name: [name], [name] Date of Service: 11/12/2021 3:00 PM Medical Record Number: [id number] Patient Account Number: [id number] Date of Birth/Sex: 10-11-1961 (60 y.o. M) Treating RN: Carlene Coria Primary Care Provider: Halina Maidens Other Clinician: Referring Provider: Halina Maidens Treating Provider/Extender: Skipper Cliche in Treatment: 21 Information Obtained from: Patient Chief Complaint Right ankle laceration Electronic Signature(s) Signed: 11/12/2021 3:27:45 PM By: Worthy Keeler PA-C Entered By: Worthy Keeler on 11/12/2021 15:27:44 [name] ([id number]) -------------------------------------------------------------------------------- Debridement Details Patient Name: [name] Date of Service: 11/12/2021 3:00 PM Medical Record Number: [id number] Patient Account Number: [id number] Date of Birth/Sex: [birth year] (60 y.o. M) Treating RN: Carlene Coria Primary Care Provider: Halina Maidens Other Clinician: Referring Provider: Halina Maidens Treating Provider/Extender: Skipper Cliche in Treatment: 21 Debridement Performed for Wound #1 Right,Lateral Ankle Assessment: Performed By: Physician Tommie Sams., PA-C Debridement Type: Debridement Level of Consciousness (Pre- Awake and Alert procedure): Pre-procedure Verification/Time Out Yes - 15:30 Taken: Start Time: 15:30 Pain Control: Lidocaine 4% Topical Solution Total Area Debrided (L x W): 0.2 (cm) x 0.1 (cm) = 0.02 (cm) Tissue and other material Viable, Non-Viable, Slough, Subcutaneous, Skin: Dermis , Skin: Epidermis, Biofilm, Slough debrided: Level: Skin/Subcutaneous Tissue Debridement Description: Excisional Instrument: Curette Bleeding: Minimum Hemostasis Achieved: Pressure End Time: 15:33 Procedural Pain: 0 Post Procedural Pain: 0 Response to Treatment: Procedure was tolerated  well Level of Consciousness (Post- Awake and Alert procedure): Post Debridement Measurements of Total Wound Length: (cm) 0.2 Width: (cm) 0.3 Depth: (cm) 0.2 Volume: (cm) 0.009 Character of Wound/Ulcer Post Debridement: Improved Post Procedure Diagnosis Same as Pre-procedure Electronic Signature(s) Signed: 11/12/2021 5:42:25 PM By: Worthy Keeler PA-C Signed: 12/01/2021 1:44:34 PM By: Carlene Coria RN Entered By: Carlene Coria on 11/12/2021 15:37:46 [name] ([id number]) -------------------------------------------------------------------------------- HPI Details Patient Name: [name] Date of Service: 11/12/2021 3:00 PM Medical Record Number: [id number] Patient Account Number: [id number] Date of Birth/Sex: 1961-01-03 (60 y.o. M) Treating RN: Carlene Coria Primary Care Provider: Halina Maidens Other Clinician: Referring Provider: Halina Maidens Treating Provider/Extender: Skipper Cliche in Treatment: 21 History of Present Illness HPI Description: 06/14/2021 upon evaluation today patient appears to be doing somewhat poorly in regard to the wound on his right ankle region. This was an injury that apparently occurred at work according to what he is telling me today. He is seen with his wife in the office at this point. Nonetheless the patient appears to have a circular opening with necrotic tissue in the base of the wound. This does not appear to be doing nearly as good as I would hope. Fortunately there does not appear to be any signs of infection though he does have a significant amount of necrotic tissue this is going to take some time to heal. He also has previously had surgery in this area there is a lot of scar tissue that will again complicate things and extended healing process. The patient does have hypertension otherwise no major medical problems. 06/25/2021 upon evaluation today patient presents for reevaluation here in our clinic he actually does seem to be  doing better in regard to his wound on the right lateral ankle. He has been tolerating the dressing changes without complication. Fortunately I do not see any signs of infection which is great news. 07/02/2021 upon evaluation today patient appears to be doing about the same in regard to his wound. With that being said I do believe that this  is cleaner there was a little bit of slough noted we have been using Iodoflex. I was able to see a little bit more granulation tissue starting to try to build up but I think the scar tissue here is going to be the biggest slowing factor with regard to healing. That was discussed with the patient and his wife today. Nonetheless I think we want to switch up things based on what I see at this point. 07/09/2021 upon evaluation today patient appears to be doing about the same in regard to his ankle ulcer. He has been tolerating the dressing changes without complication. Fortunately there does not appear to be any signs of active infection. No fevers, chills, nausea, vomiting, or diarrhea. 07/16/2021 upon evaluation today patient appears to be doing better to some degree in regard to his wound on ankle region. He actually showed some signs of new granulation growth which is actually a good thing. Fortunately there does not appear to be any evidence of active infection and wound did not appear to be too dry upon evaluation today which is also good news. No fevers, chills, nausea, vomiting, or diarrhea. 07/23/2021 upon evaluation today patient appears to be doing a little better in regard to his wound. He has been tolerating the dressing changes without complication. Fortunately there does not appear to be any signs of active infection which is great news. No fever chills noted we are still waiting on the results back from the TheraSkin investigation to see whether or not he qualifies for this treatment option. 07/30/2021 upon evaluation today patient appears to be doing a  little better again in regard to his wound. He is showing signs of improvement which is great news. I am very pleased with where things stand. I do believe that honestly all things considered he is making good progress but were just not able to do anything as far as advanced modalities to speed this up as insurance has literally devout denied everything including a snap VAC, Apligraf, PuraPly, and TheraSkin. Again I am not really certain what else to do otherwise other than just try to continue along the path we are as far as getting this wound to heal. 08/06/2021 upon evaluation today patient appears to be doing well currently he is of peering to make progress slowly but nonetheless this is at least making progress at this point. There does not appear to be any signs of active infection at this time. No fevers, chills, nausea, vomiting, or diarrhea. With that being said I do think that we are headed in the right direction with what I am seeing currently. I do wish we could do the snap VAC but to be honest I think this is the best we can do with the collagen as it stands currently. 08/13/2021 upon evaluation today patient unfortunately appears to be doing a little bit more poorly in regard to the fact that he is having some pain on the side of his ankle. This does have me a little bit concerned this is on the edge of the wound. There is a very hard area under this that I presume to be more of a calcification although I cannot be certain this is not just a superficial bony protrusion. I do feel it in the base of the wound but again I assumed more calcification due to inflammation from prior surgery but again that may not actually be the case. I do think that we probably need to go forward with an x-ray  at this point to see if there is anything going on that could be a more significant issue here. Depending on the results of the x-ray we may be looking at an MRI as well. 08/17/2021 upon evaluation today  patient appears to be doing well with regard to his ankle ulcer. This actually is showing some signs of improvement which is great news and overall very pleased in that regard. Fortunately there does not appear to be any signs of active infection which is great news as well. No fevers, chills, nausea, vomiting, or diarrhea. With that being said the x-ray showed some surgical abnormalities and along the lateral ankle as well there was some mention of an irregularity. With that being said there was nothing specifically to indicate osteomyelitis as a probable diagnosis here. With that being said I did discuss this with the patient and his wife today their opinion is to hold off on an MRI at this point especially since his wound seems to be showing signs of healing. Fortunately there is no evidence of active infection systemically at this point. 09/03/2021 upon evaluation today patient appears to be doing well with regard to his wound. He has been tolerating the dressing changes without complication. Fortunately there does not appear to be any signs of active infection which is also great news. In general I am very happy with where we stand. 09/17/2021 upon evaluation today patient appears to be doing well currently in regard to his wound. He has been tolerating the dressing changes without complication. Fortunately there does not appear to be any evidence of active infection which is great news. No fevers, chills, nausea, vomiting, or diarrhea. 10/01/2021 upon evaluation today patient appears to be doing well with regard to his ankle ulcer. He has been tolerating the dressing changes without complication. Fortunately there does not appear to be any signs of infection which is great and overall I am extremely pleased with where we stand today. No fevers, chills, nausea, vomiting, or diarrhea. [name], [name] ([id number]) 10/22/2021 upon evaluation today patient appears to be doing well with regard to his  wound on the ankle region. Fortunately there does not appear to be any signs of active infection at this time. I think that he has made significant improvement since he was last here and this is great news. 11/12/2021 upon evaluation today patient appears to be doing very well in regard to his wound. In fact this appears to be very close to complete resolution which is great news. I think that he has come a very long way in the time that we have been seeing him. His wife is taking great care of this. Electronic Signature(s) Signed: 11/12/2021 4:40:23 PM By: Worthy Keeler PA-C Entered By: Worthy Keeler on 11/12/2021 16:40:23 [name] ([id number]) -------------------------------------------------------------------------------- Physical Exam Details Patient Name: [name] Date of Service: 11/12/2021 3:00 PM Medical Record Number: [id number] Patient Account Number: [id number] Date of Birth/Sex: 10/07/1961 (60 y.o. M) Treating RN: Carlene Coria Primary Care Provider: Halina Maidens Other Clinician: Referring Provider: Halina Maidens Treating Provider/Extender: Jeri Cos Weeks in Treatment: 75 Constitutional Well-nourished and well-hydrated in no acute distress. Respiratory normal breathing without difficulty. Psychiatric this patient is able to make decisions and demonstrates good insight into disease process. Alert and Oriented x 3. pleasant and cooperative. Notes Upon inspection patient's wound bed actually showed signs of excellent epithelization which I think is actually a very good sign. In general he just has a couple small areas that  are still open and overall I think he is making excellent progress. Electronic Signature(s) Signed: 11/12/2021 4:40:42 PM By: Worthy Keeler PA-C Entered By: Worthy Keeler on 11/12/2021 16:40:42 [name] ([id number]) -------------------------------------------------------------------------------- Physician Orders  Details Patient Name: [name] Date of Service: 11/12/2021 3:00 PM Medical Record Number: [id number] Patient Account Number: [id number] Date of Birth/Sex: December 14, 1961 (60 y.o. M) Treating RN: Carlene Coria Primary Care Provider: Halina Maidens Other Clinician: Referring Provider: Halina Maidens Treating Provider/Extender: Skipper Cliche in Treatment: 21 Verbal / Phone Orders: No Diagnosis Coding ICD-10 Coding Code Description R00.762U Laceration without foreign body, right ankle, initial encounter L97.312 Non-pressure chronic ulcer of right ankle with fat layer exposed Johnson (primary) hypertension Follow-up Appointments o Return Appointment in 3 weeks. Bathing/ Shower/ Hygiene o May shower; gently cleanse wound with antibacterial soap, rinse and pat dry prior to dressing wounds Edema Control - Lymphedema / Segmental Compressive Device / Other o Elevate, Exercise Daily and Avoid Standing for Long Periods of Time. o Elevate legs to the level of the heart and pump ankles as often as possible o Elevate leg(s) parallel to the floor when sitting. Wound Treatment Wound #1 - Ankle Wound Laterality: Right, Lateral Cleanser: Soap and Water 3 x Per Week/30 Days Discharge Instructions: Gently cleanse wound with antibacterial soap, rinse and pat dry prior to dressing wounds Primary Dressing: Prisma 4.34 (in) (Generic) 3 x Per Week/30 Days Discharge Instructions: Moisten w/normal saline or sterile water; Cover wound as directed. Do not remove from wound bed. Secondary Dressing: Foam Dressing, 4x4 (in/in) 3 x Per Week/30 Days Secondary Dressing: Kerlix 4.5 x 4.1 (in/yd) 3 x Per Week/30 Days Discharge Instructions: Apply Kerlix 4.5 x 4.1 (in/yd) as instructed Secondary Dressing: coban 3 x Per Week/30 Days Electronic Signature(s) Signed: 11/12/2021 5:42:25 PM By: Worthy Keeler PA-C Signed: 12/01/2021 1:44:34 PM By: Carlene Coria RN Entered By: Carlene Coria on  11/12/2021 15:44:16 [name] ([id number]) -------------------------------------------------------------------------------- Problem List Details Patient Name: [name] Date of Service: 11/12/2021 3:00 PM Medical Record Number: [id number] Patient Account Number: [id number] Date of Birth/Sex: 1961/05/21 (60 y.o. M) Treating RN: Carlene Coria Primary Care Provider: Halina Maidens Other Clinician: Referring Provider: Halina Maidens Treating Provider/Extender: Skipper Cliche in Treatment: 21 Active Problems ICD-10 Encounter Code Description Active Date MDM Diagnosis S91.011A Laceration without foreign body, right ankle, initial encounter 06/18/2021 No Yes L97.312 Non-pressure chronic ulcer of right ankle with fat layer exposed 06/18/2021 No Yes I10 Essential (primary) hypertension 06/18/2021 No Yes Inactive Problems Resolved Problems Electronic Signature(s) Signed: 11/12/2021 3:27:36 PM By: Worthy Keeler PA-C Entered By: Worthy Keeler on 11/12/2021 15:27:36 [name] ([id number]) -------------------------------------------------------------------------------- Progress Note Details Patient Name: [name] Date of Service: 11/12/2021 3:00 PM Medical Record Number: [id number] Patient Account Number: [id number] Date of Birth/Sex: Jul 01, 1961 (60 y.o. M) Treating RN: Carlene Coria Primary Care Provider: Halina Maidens Other Clinician: Referring Provider: Halina Maidens Treating Provider/Extender: Skipper Cliche in Treatment: 21 Subjective Chief Complaint Information obtained from Patient Right ankle laceration History of Present Illness (HPI) 06/14/2021 upon evaluation today patient appears to be doing somewhat poorly in regard to the wound on his right ankle region. This was an injury that apparently occurred at work according to what he is telling me today. He is seen with his wife in the office at this point. Nonetheless the patient appears  to have a circular opening with necrotic tissue in the base of the wound. This does not appear to  be doing nearly as good as I would hope. Fortunately there does not appear to be any signs of infection though he does have a significant amount of necrotic tissue this is going to take some time to heal. He also has previously had surgery in this area there is a lot of scar tissue that will again complicate things and extended healing process. The patient does have hypertension otherwise no major medical problems. 06/25/2021 upon evaluation today patient presents for reevaluation here in our clinic he actually does seem to be doing better in regard to his wound on the right lateral ankle. He has been tolerating the dressing changes without complication. Fortunately I do not see any signs of infection which is great news. 07/02/2021 upon evaluation today patient appears to be doing about the same in regard to his wound. With that being said I do believe that this is cleaner there was a little bit of slough noted we have been using Iodoflex. I was able to see a little bit more granulation tissue starting to try to build up but I think the scar tissue here is going to be the biggest slowing factor with regard to healing. That was discussed with the patient and his wife today. Nonetheless I think we want to switch up things based on what I see at this point. 07/09/2021 upon evaluation today patient appears to be doing about the same in regard to his ankle ulcer. He has been tolerating the dressing changes without complication. Fortunately there does not appear to be any signs of active infection. No fevers, chills, nausea, vomiting, or diarrhea. 07/16/2021 upon evaluation today patient appears to be doing better to some degree in regard to his wound on ankle region. He actually showed some signs of new granulation growth which is actually a good thing. Fortunately there does not appear to be any evidence of active  infection and wound did not appear to be too dry upon evaluation today which is also good news. No fevers, chills, nausea, vomiting, or diarrhea. 07/23/2021 upon evaluation today patient appears to be doing a little better in regard to his wound. He has been tolerating the dressing changes without complication. Fortunately there does not appear to be any signs of active infection which is great news. No fever chills noted we are still waiting on the results back from the TheraSkin investigation to see whether or not he qualifies for this treatment option. 07/30/2021 upon evaluation today patient appears to be doing a little better again in regard to his wound. He is showing signs of improvement which is great news. I am very pleased with where things stand. I do believe that honestly all things considered he is making good progress but were just not able to do anything as far as advanced modalities to speed this up as insurance has literally devout denied everything including a snap VAC, Apligraf, PuraPly, and TheraSkin. Again I am not really certain what else to do otherwise other than just try to continue along the path we are as far as getting this wound to heal. 08/06/2021 upon evaluation today patient appears to be doing well currently he is of peering to make progress slowly but nonetheless this is at least making progress at this point. There does not appear to be any signs of active infection at this time. No fevers, chills, nausea, vomiting, or diarrhea. With that being said I do think that we are headed in the right direction with what I am seeing  currently. I do wish we could do the snap VAC but to be honest I think this is the best we can do with the collagen as it stands currently. 08/13/2021 upon evaluation today patient unfortunately appears to be doing a little bit more poorly in regard to the fact that he is having some pain on the side of his ankle. This does have me a little bit  concerned this is on the edge of the wound. There is a very hard area under this that I presume to be more of a calcification although I cannot be certain this is not just a superficial bony protrusion. I do feel it in the base of the wound but again I assumed more calcification due to inflammation from prior surgery but again that may not actually be the case. I do think that we probably need to go forward with an x-ray at this point to see if there is anything going on that could be a more significant issue here. Depending on the results of the x-ray we may be looking at an MRI as well. 08/17/2021 upon evaluation today patient appears to be doing well with regard to his ankle ulcer. This actually is showing some signs of improvement which is great news and overall very pleased in that regard. Fortunately there does not appear to be any signs of active infection which is great news as well. No fevers, chills, nausea, vomiting, or diarrhea. With that being said the x-ray showed some surgical abnormalities and along the lateral ankle as well there was some mention of an irregularity. With that being said there was nothing specifically to indicate osteomyelitis as a probable diagnosis here. With that being said I did discuss this with the patient and his wife today their opinion is to hold off on an MRI at this point especially since his wound seems to be showing signs of healing. Fortunately there is no evidence of active infection systemically at this point. 09/03/2021 upon evaluation today patient appears to be doing well with regard to his wound. He has been tolerating the dressing changes without complication. Fortunately there does not appear to be any signs of active infection which is also great news. In general I am very happy with where we stand. 09/17/2021 upon evaluation today patient appears to be doing well currently in regard to his wound. He has been tolerating the dressing changes without  complication. Fortunately there does not appear to be any evidence of active infection which is great news. No fevers, chills, nausea, [name], [name] ([id number]) vomiting, or diarrhea. 10/01/2021 upon evaluation today patient appears to be doing well with regard to his ankle ulcer. He has been tolerating the dressing changes without complication. Fortunately there does not appear to be any signs of infection which is great and overall I am extremely pleased with where we stand today. No fevers, chills, nausea, vomiting, or diarrhea. 10/22/2021 upon evaluation today patient appears to be doing well with regard to his wound on the ankle region. Fortunately there does not appear to be any signs of active infection at this time. I think that he has made significant improvement since he was last here and this is great news. 11/12/2021 upon evaluation today patient appears to be doing very well in regard to his wound. In fact this appears to be very close to complete resolution which is great news. I think that he has come a very long way in the time that we have been seeing  him. His wife is taking great care of this. Objective Constitutional Well-nourished and well-hydrated in no acute distress. Vitals Time Taken: 3:17 PM, Height: 66 in, Weight: 161 lbs, BMI: 26, Temperature: 98.2 F, Pulse: 60 bpm, Respiratory Rate: 16 breaths/min, Blood Pressure: 151/88 mmHg. Respiratory normal breathing without difficulty. Psychiatric this patient is able to make decisions and demonstrates good insight into disease process. Alert and Oriented x 3. pleasant and cooperative. General Notes: Upon inspection patient's wound bed actually showed signs of excellent epithelization which I think is actually a very good sign. In general he just has a couple small areas that are still open and overall I think he is making excellent progress. Integumentary (Hair, Skin) Wound #1 status is Open. Original cause of wound  was Trauma. The date acquired was: 05/09/2021. The wound has been in treatment 21 weeks. The wound is located on the Right,Lateral Ankle. The wound measures 0.2cm length x 0.1cm width x 0.2cm depth; 0.016cm^2 area and 0.003cm^3 volume. There is Fat Layer (Subcutaneous Tissue) exposed. There is no tunneling or undermining noted. There is a medium amount of serosanguineous drainage noted. There is large (67-100%) red, pink granulation within the wound bed. There is a small (1-33%) amount of necrotic tissue within the wound bed including Adherent Slough. Assessment Active Problems ICD-10 Laceration without foreign body, right ankle, initial encounter Non-pressure chronic ulcer of right ankle with fat layer exposed Essential (primary) hypertension Procedures Wound #1 Pre-procedure diagnosis of Wound #1 is a Trauma, Other located on the Right,Lateral Ankle . There was a Excisional Skin/Subcutaneous Tissue Debridement with a total area of 0.02 sq cm performed by Tommie Sams., PA-C. With the following instrument(s): Curette to remove Viable and Non-Viable tissue/material. Material removed includes Subcutaneous Tissue, Slough, Skin: Dermis, Skin: Epidermis, and Biofilm after achieving pain control using Lidocaine 4% Topical Solution. No specimens were taken. A time out was conducted at 15:30, prior to the start of the procedure. A Minimum amount of bleeding was controlled with Pressure. The procedure was tolerated well with a pain level of 0 throughout and a pain level of 0 following the procedure. Post Debridement Measurements: 0.2cm length x 0.3cm width x 0.2cm depth; 0.009cm^3 volume. Character of Wound/Ulcer Post Debridement is improved. [name], [name] ([id number]) Post procedure Diagnosis Wound #1: Same as Pre-Procedure Plan Follow-up Appointments: Return Appointment in 3 weeks. Bathing/ Shower/ Hygiene: May shower; gently cleanse wound with antibacterial soap, rinse and pat dry prior to  dressing wounds Edema Control - Lymphedema / Segmental Compressive Device / Other: Elevate, Exercise Daily and Avoid Standing for Long Periods of Time. Elevate legs to the level of the heart and pump ankles as often as possible Elevate leg(s) parallel to the floor when sitting. WOUND #1: - Ankle Wound Laterality: Right, Lateral Cleanser: Soap and Water 3 x Per Week/30 Days Discharge Instructions: Gently cleanse wound with antibacterial soap, rinse and pat dry prior to dressing wounds Primary Dressing: Prisma 4.34 (in) (Generic) 3 x Per Week/30 Days Discharge Instructions: Moisten w/normal saline or sterile water; Cover wound as directed. Do not remove from wound bed. Secondary Dressing: Foam Dressing, 4x4 (in/in) 3 x Per Week/30 Days Secondary Dressing: Kerlix 4.5 x 4.1 (in/yd) 3 x Per Week/30 Days Discharge Instructions: Apply Kerlix 4.5 x 4.1 (in/yd) as instructed Secondary Dressing: coban 3 x Per Week/30 Days 1. I would recommend currently that we going continue with the wound care measures as before and the patient is in agreement the plan. This includes the use of the  silver collagen dressing which I think is doing a good job. 2. I am also can recommend that we have the patient continue with the foam followed by wrapping with Kerlix to secure in place which I think is doing a great job for him. We will see patient back for reevaluation in 3 weeks here in the clinic. If anything worsens or changes patient will contact our office for additional recommendations. Electronic Signature(s) Signed: 11/12/2021 4:49:28 PM By: Worthy Keeler PA-C Entered By: Worthy Keeler on 11/12/2021 16:49:27 [name] ([id number]) -------------------------------------------------------------------------------- SuperBill Details Patient Name: [name] Date of Service: 11/12/2021 Medical Record Number: [id number] Patient Account Number: [id number] Date of Birth/Sex: December 19, 1961 (60 y.o.  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 [facility] (primary) hypertension Facility Procedures CPT4 Code: 52778242 Description: [phone] - WOUND CARE VISIT-LEV 2 EST PT Modifier: Quantity: 1 CPT4 Code: 44315400 Description: 86761 - DEB SUBQ TISSUE 20 SQ CM/< Modifier: Quantity: 1 CPT4 Code: Description: ICD-10 Diagnosis Description P50.932 Non-pressure chronic ulcer of right ankle with fat layer exposed Modifier: Quantity: Physician Procedures CPT4 Code: 6712458 Description: 11042 - WC PHYS SUBQ TISS 20 SQ CM Modifier: Quantity: 1 CPT4 Code: Description: ICD-10 Diagnosis Description K99.833 Non-pressure chronic ulcer of right ankle with fat layer exposed Modifier: Quantity: Electronic Signature(s) Signed: 11/12/2021 4:49:53 PM By: Worthy Keeler PA-C Entered By: Worthy Keeler on 11/12/2021 16:49:53

## 2021-11-14 LAB — FECAL OCCULT BLOOD, IMMUNOCHEMICAL: Fecal Occult Bld: NEGATIVE

## 2021-11-14 LAB — SPECIMEN STATUS REPORT

## 2021-12-01 NOTE — Progress Notes (Signed)
Iser, Trapper G. ([id number]) Visit Report for 11/12/2021 Arrival Information Details Patient Name: [name], [name] Date of Service: 11/12/2021 3:00 PM Medical Record Number: [id number] Patient Account Number: [id number] Date of Birth/Sex: 01/16/1961 (60 y.o. M) Treating RN: Epps, Carrie Primary Care Provider: Berglund, Laura Other Clinician: Referring Provider: Berglund, Laura Treating Provider/Extender: Stone, Hoyt Weeks in Treatment: 21 Visit Information History Since Last Visit All ordered tests and consults were completed: No Patient Arrived: Ambulatory Added or deleted any medications: No Arrival Time: 15:14 Any new allergies or adverse reactions: No Accompanied By: self Had a fall or experienced change in No Transfer Assistance: None activities of daily living that may affect Patient Identification Verified: Yes risk of falls: Secondary Verification Process Completed: Yes Signs or symptoms of abuse/neglect since last visito No Patient Requires Transmission-Based Precautions: No Hospitalized since last visit: No Patient Has Alerts: Yes Implantable device outside of the clinic excluding No Patient Alerts: NOT diabetic cellular tissue based products placed in the center since last visit: Has Dressing in Place as Prescribed: Yes Has Compression in Place as Prescribed: Yes Pain Present Now: No Electronic Signature(s) Signed: 12/01/2021 1:44:34 PM By: Epps, Carrie RN Entered By: Epps, Carrie on 11/12/2021 15:17:39 [name], [name] ([id number]) -------------------------------------------------------------------------------- Clinic Level of Care Assessment Details Patient Name: [name], [name] Date of Service: 11/12/2021 3:00 PM Medical Record Number: [id number] Patient Account Number: [id number] Date of Birth/Sex: 04/15/1961 (60 y.o. M) Treating RN: Epps, Carrie Primary Care Provider: Berglund, Laura Other Clinician: Referring Provider: Berglund, Laura Treating  Provider/Extender: Stone, Hoyt Weeks in Treatment: 21 Clinic Level of Care Assessment Items TOOL 4 Quantity Score X - Use when only an EandM is performed on FOLLOW-UP visit 1 0 ASSESSMENTS - Nursing Assessment / Reassessment X - Reassessment of Co-morbidities (includes updates in patient status) 1 10 X- 1 5 Reassessment of Adherence to Treatment Plan ASSESSMENTS - Wound and Skin Assessment / Reassessment X - Simple Wound Assessment / Reassessment - one wound 1 5 [] - 0 Complex Wound Assessment / Reassessment - multiple wounds [] - 0 Dermatologic / Skin Assessment (not related to wound area) ASSESSMENTS - Focused Assessment [] - Circumferential Edema Measurements - multi extremities 0 [] - 0 Nutritional Assessment / Counseling / Intervention [] - 0 Lower Extremity Assessment (monofilament, tuning fork, pulses) [] - 0 Peripheral Arterial Disease Assessment (using hand held doppler) ASSESSMENTS - Ostomy and/or Continence Assessment and Care [] - Incontinence Assessment and Management 0 [] - 0 Ostomy Care Assessment and Management (repouching, etc.) PROCESS - Coordination of Care X - Simple Patient / Family Education for ongoing care 1 15 [] - 0 Complex (extensive) Patient / Family Education for ongoing care [] - 0 Staff obtains Consents, Records, Test Results / Process Orders [] - 0 Staff telephones HHA, Nursing Homes / Clarify orders / etc [] - 0 Routine Transfer to another Facility (non-emergent condition) [] - 0 Routine Hospital Admission (non-emergent condition) [] - 0 New Admissions / Insurance Authorizations / Ordering NPWT, Apligraf, etc. [] - 0 Emergency Hospital Admission (emergent condition) X- 1 10 Simple Discharge Coordination [] - 0 Complex (extensive) Discharge Coordination PROCESS - Special Needs [] - Pediatric / Minor Patient Management 0 [] - 0 Isolation Patient Management [] - 0 Hearing / Language / Visual special needs [] - 0 Assessment of  Community assistance (transportation, D/C planning, etc.) [] - 0 Additional assistance / Altered mentation [] - 0 Support Surface(s) Assessment (bed, cushion, seat, etc.) INTERVENTIONS - Wound Cleansing / Measurement   REAL, CONA ([id number]) X- 1 5 Simple Wound Cleansing - one wound [] - 0 Complex Wound Cleansing - multiple wounds X- 1 5 Wound Imaging (photographs - any number of wounds) [] - 0 Wound Tracing (instead of photographs) X- 1 5 Simple Wound Measurement - one wound [] - 0 Complex Wound Measurement - multiple wounds INTERVENTIONS - Wound Dressings [] - Small Wound Dressing one or multiple wounds 0 [] - 0 Medium Wound Dressing one or multiple wounds [] - 0 Large Wound Dressing one or multiple wounds X- 1 5 Application of Medications - topical [] - 0 Application of Medications - injection INTERVENTIONS - Miscellaneous [] - External ear exam 0 [] - 0 Specimen Collection (cultures, biopsies, blood, body fluids, etc.) [] - 0 Specimen(s) / Culture(s) sent or taken to Lab for analysis [] - 0 Patient Transfer (multiple staff / [employer] fast streamer / Similar devices) [] - 0 Simple Staple / Suture removal (25 or less) [] - 0 Complex Staple / Suture removal (26 or more) [] - 0 Hypo / Hyperglycemic Management (close monitor of Blood Glucose) [] - 0 Ankle / Brachial Index (ABI) - do not check if billed separately X- 1 5 Vital Signs Has the patient been seen at the hospital within the last three years: Yes Total Score: 70 Level Of Care: New/Established - Level 2 Electronic Signature(s) Signed: 12/01/2021 1:44:34 PM By: Carlene Coria RN Entered By: Carlene Coria on 11/12/2021 15:44:44 [name] ([id number]) -------------------------------------------------------------------------------- Encounter Discharge Information Details Patient Name: [name] Date of Service: 11/12/2021 3:00 PM Medical Record Number: [id number] Patient Account Number:  [id number] Date of Birth/Sex: Apr 29, 1961 (60 y.o. M) Treating RN: Carlene Coria Primary Care Ronak Duquette: Halina Maidens Other Clinician: Referring Teriana Danker: Halina Maidens Treating Airika Alkhatib/Extender: Skipper Cliche in Treatment: 21 Encounter Discharge Information Items Post Procedure Vitals Discharge Condition: Stable Temperature (F): 98.2 Ambulatory Status: Ambulatory Pulse (bpm): 60 Discharge Destination: Home Respiratory Rate (breaths/min): 16 Transportation: Private Auto Blood Pressure (mmHg): 151/88 Accompanied By: wife Schedule Follow-up Appointment: Yes Clinical Summary of Care: Patient Declined Electronic Signature(s) Signed: 12/01/2021 1:44:34 PM By: Carlene Coria RN Entered By: Carlene Coria on 11/12/2021 15:46:13 [name] ([id number]) -------------------------------------------------------------------------------- Lower Extremity Assessment Details Patient Name: [name] Date of Service: 11/12/2021 3:00 PM Medical Record Number: [id number] Patient Account Number: [id number] Date of Birth/Sex: 1961/09/07 (60 y.o. M) Treating RN: Carlene Coria Primary Care Gift Rueckert: Halina Maidens Other Clinician: Referring Priseis Cratty: Halina Maidens Treating Cavon Nicolls/Extender: Jeri Cos Weeks in Treatment: 21 Vascular Assessment Pulses: Dorsalis Pedis Palpable: [Right:Yes] Electronic Signature(s) Signed: 12/01/2021 1:44:34 PM By: Carlene Coria RN Entered By: Carlene Coria on 11/12/2021 15:26:45 [name] ([id number]) -------------------------------------------------------------------------------- Multi Wound Chart Details Patient Name: [name] Date of Service: 11/12/2021 3:00 PM Medical Record Number: [id number] Patient Account Number: [id number] Date of Birth/Sex: June 15, 1961 (60 y.o. M) Treating RN: Carlene Coria Primary Care Amariya Liskey: Halina Maidens Other Clinician: Referring Detra Bores: Halina Maidens Treating Taichi Repka/Extender: Skipper Cliche in Treatment: 21 Vital Signs Height(in): 66 Pulse(bpm): 60 Weight(lbs): 161 Blood Pressure(mmHg): 151/88 Body Mass Index(BMI): 26 Temperature(F): 98.2 Respiratory Rate(breaths/min): 16 Photos: [N/A:N/A] Wound Location: Right, Lateral Ankle N/A N/A Wounding Event: Trauma N/A N/A Primary Etiology: Trauma, Other N/A N/A Comorbid History: Hypertension N/A N/A Date Acquired: 05/09/2021 N/A N/A Weeks of Treatment: 21 N/A N/A Wound Status: Open N/A N/A Measurements L x W x D (cm) 0.2x0.1x0.2 N/A N/A Area (cm) : 0.016 N/A N/A Volume (cm) : 0.003 N/A N/A % Reduction in Area:  98.00% N/A N/A % Reduction in Volume: 99.20% N/A N/A Classification: Full Thickness Without Exposed N/A N/A Support Structures Exudate Amount: Medium N/A N/A Exudate Type: Serosanguineous N/A N/A Exudate Color: red, brown N/A N/A Granulation Amount: Large (67-100%) N/A N/A Granulation Quality: Red, Pink N/A N/A Necrotic Amount: Small (1-33%) N/A N/A Exposed Structures: Fat Layer (Subcutaneous Tissue): N/A N/A Yes Fascia: No Tendon: No Muscle: No Joint: No Bone: No Epithelialization: None N/A N/A Treatment Notes Electronic Signature(s) Signed: 12/01/2021 1:44:34 PM By: Epps, Carrie RN Entered By: Epps, Carrie on 11/12/2021 15:34:37 [name], [name] ([id number]) -------------------------------------------------------------------------------- Multi-Disciplinary Care Plan Details Patient Name: [name], [name] Date of Service: 11/12/2021 3:00 PM Medical Record Number: [id number] Patient Account Number: [id number] Date of Birth/Sex: 12/26/1960 (60 y.o. M) Treating RN: Epps, Carrie Primary Care Provider: Berglund, Laura Other Clinician: Referring Provider: Berglund, Laura Treating Provider/Extender: Stone, Hoyt Weeks in Treatment: 21 Active Inactive Wound/Skin Impairment Nursing Diagnoses: Knowledge deficit related to ulceration/compromised skin integrity Goals: Patient/caregiver will  verbalize understanding of skin care regimen Date Initiated: 06/18/2021 Target Resolution Date: 09/18/2021 Goal Status: Active Ulcer/skin breakdown will have a volume reduction of 30% by week 4 Date Initiated: 06/18/2021 Date Inactivated: 07/16/2021 Target Resolution Date: 07/18/2021 Goal Status: Unmet Unmet Reason: comorbities Ulcer/skin breakdown will have a volume reduction of 50% by week 8 Date Initiated: 06/18/2021 Date Inactivated: 08/20/2021 Target Resolution Date: 08/18/2021 Goal Status: Unmet Unmet Reason: comorbites Ulcer/skin breakdown will have a volume reduction of 80% by week 12 Date Initiated: 06/18/2021 Date Inactivated: 10/01/2021 Target Resolution Date: 09/18/2021 Goal Status: Met Ulcer/skin breakdown will heal within 14 weeks Date Initiated: 06/18/2021 Target Resolution Date: 10/18/2021 Goal Status: Active Interventions: Assess patient/caregiver ability to obtain necessary supplies Assess patient/caregiver ability to perform ulcer/skin care regimen upon admission and as needed Assess ulceration(s) every visit Notes: Electronic Signature(s) Signed: 12/01/2021 1:44:34 PM By: Epps, Carrie RN Entered By: Epps, Carrie on 11/12/2021 15:34:16 Kamiya, Bard G. ([id number]) -------------------------------------------------------------------------------- Pain Assessment Details Patient Name: [name], [name] Date of Service: 11/12/2021 3:00 PM Medical Record Number: [id number] Patient Account Number: [id number] Date of Birth/Sex: 04/28/1961 (60 y.o. M) Treating RN: Epps, Carrie Primary Care Provider: Berglund, Laura Other Clinician: Referring Provider: Berglund, Laura Treating Provider/Extender: Stone, Hoyt Weeks in Treatment: 21 Active Problems Location of Pain Severity and Description of Pain Patient Has Paino No Site Locations Pain Management and Medication Current Pain Management: Electronic Signature(s) Signed: 12/01/2021 1:44:34 PM By: Epps, Carrie RN Entered  By: Epps, Carrie on 11/12/2021 15:18:39 [name], [name] ([id number]) -------------------------------------------------------------------------------- Patient/Caregiver Education Details Patient Name: [name], [name] Date of Service: 11/12/2021 3:00 PM Medical Record Number: [id number] Patient Account Number: [id number] Date of Birth/Gender: 06/21/1961 (60 y.o. M) Treating RN: Epps, Carrie Primary Care Physician: Berglund, Laura Other Clinician: Referring Physician: Berglund, Laura Treating Physician/Extender: Stone, Hoyt Weeks in Treatment: 21 Education Assessment Education Provided To: Patient Education Topics Provided Wound/Skin Impairment: Methods: Explain/Verbal Responses: State content correctly Electronic Signature(s) Signed: 12/01/2021 1:44:34 PM By: Epps, Carrie RN Entered By: Epps, Carrie on 11/12/2021 15:45:08 [name], [name] ([id number]) -------------------------------------------------------------------------------- Wound Assessment Details Patient Name: [name], [name] Date of Service: 11/12/2021 3:00 PM Medical Record Number: [id number] Patient Account Number: [id number] Date of Birth/Sex: 04/19/1961 (60 y.o. M) Treating RN: Epps, Carrie Primary Care Provider: Berglund, Laura Other Clinician: Referring Provider: Berglund, Laura Treating Provider/Extender: Stone, Hoyt Weeks in Treatment: 21 Wound Status Wound Number: 1 Primary Etiology: Trauma, Other Wound Location: Right, Lateral Ankle Wound Status: Open Wounding Event: Trauma Comorbid History: Hypertension Date Acquired: 05/09/2021 Weeks   Of Treatment: 21 Clustered Wound: No Photos Wound Measurements Length: (cm) 0.2 Width: (cm) 0.1 Depth: (cm) 0.2 Area: (cm) 0.016 Volume: (cm) 0.003 % Reduction in Area: 98% % Reduction in Volume: 99.2% Epithelialization: None Tunneling: No Undermining: No Wound Description Classification: Full Thickness Without Exposed Support Structures Exudate Amount:  Medium Exudate Type: Serosanguineous Exudate Color: red, brown Foul Odor After Cleansing: No Slough/Fibrino Yes Wound Bed Granulation Amount: Large (67-100%) Exposed Structure Granulation Quality: Red, Pink Fascia Exposed: No Necrotic Amount: Small (1-33%) Fat Layer (Subcutaneous Tissue) Exposed: Yes Necrotic Quality: Adherent Slough Tendon Exposed: No Muscle Exposed: No Joint Exposed: No Bone Exposed: No Treatment Notes Wound #1 (Ankle) Wound Laterality: Right, Lateral Cleanser Soap and Water Discharge Instruction: Gently cleanse wound with antibacterial soap, rinse and pat dry prior to dressing wounds Peri-Wound Care [name], [name] ([id number]) Topical Primary Dressing Prisma 4.34 (in) Discharge Instruction: Moisten w/normal saline or sterile water; Cover wound as directed. Do not remove from wound bed. Secondary Dressing Foam Dressing, 4x4 (in/in) Kerlix 4.5 x 4.1 (in/yd) Discharge Instruction: Apply Kerlix 4.5 x 4.1 (in/yd) as instructed coban Secured With Compression Wrap Compression Stockings Add-Ons Electronic Signature(s) Signed: 12/01/2021 1:44:34 PM By: Epps, Carrie RN Entered By: Epps, Carrie on 11/12/2021 15:25:47 [name], [name] ([id number]) -------------------------------------------------------------------------------- Vitals Details Patient Name: [name], [name] Date of Service: 11/12/2021 3:00 PM Medical Record Number: [id number] Patient Account Number: [id number] Date of Birth/Sex: 06/16/1961 (60 y.o. M) Treating RN: Epps, Carrie Primary Care Provider: Berglund, Laura Other Clinician: Referring Provider: Berglund, Laura Treating Provider/Extender: Stone, Hoyt Weeks in Treatment: 21 Vital Signs Time Taken: 15:17 Temperature (°F): 98.2 Height (in): 66 Pulse (bpm): 60 Weight (lbs): 161 Respiratory Rate (breaths/min): 16 Body Mass Index (BMI): 26 Blood Pressure (mmHg): 151/88 Reference Range: 80 - 120 mg / dl Electronic  Signature(s) Signed: 12/01/2021 1:44:34 PM By: Epps, Carrie RN Entered By: Epps, Carrie on 11/12/2021 15:18:02 

## 2021-12-03 ENCOUNTER — Encounter: Payer: 59 | Attending: Physician Assistant | Admitting: Physician Assistant

## 2021-12-03 ENCOUNTER — Other Ambulatory Visit: Payer: Self-pay

## 2021-12-03 DIAGNOSIS — Y990 Civilian activity done for income or pay: Secondary | ICD-10-CM | POA: Insufficient documentation

## 2021-12-03 DIAGNOSIS — S91011A Laceration without foreign body, right ankle, initial encounter: Secondary | ICD-10-CM | POA: Insufficient documentation

## 2021-12-03 DIAGNOSIS — L97312 Non-pressure chronic ulcer of right ankle with fat layer exposed: Secondary | ICD-10-CM | POA: Insufficient documentation

## 2021-12-03 DIAGNOSIS — X58XXXA Exposure to other specified factors, initial encounter: Secondary | ICD-10-CM | POA: Insufficient documentation

## 2021-12-03 DIAGNOSIS — I10 Essential (primary) hypertension: Secondary | ICD-10-CM | POA: Diagnosis not present

## 2021-12-03 NOTE — Progress Notes (Addendum)
[name], [name] ([id number]) Visit Report for 12/03/2021 Chief Complaint Document Details Patient Name: [name], [name]. Date of Service: 12/03/2021 2:45 PM Medical Record Number: [id number] Patient Account Number: [id number] Date of Birth/Sex: 1961/05/17 (60 y.o. M) Treating RN: Carlene Coria Primary Care Provider: Halina Maidens Other Clinician: Referring Provider: Halina Maidens Treating Provider/Extender: Skipper Cliche in Treatment: 24 Information Obtained from: Patient Chief Complaint Right ankle laceration Electronic Signature(s) Signed: 12/03/2021 2:41:47 PM By: Worthy Keeler PA-C Entered By: Worthy Keeler on 12/03/2021 14:41:46 [name] ([id number]) -------------------------------------------------------------------------------- HPI Details Patient Name: [name] Date of Service: 12/03/2021 2:45 PM Medical Record Number: [id number] Patient Account Number: [id number] Date of Birth/Sex: Nov 05, 1961 (60 y.o. M) Treating RN: Carlene Coria Primary Care Provider: Halina Maidens Other Clinician: Referring Provider: Halina Maidens Treating Provider/Extender: Skipper Cliche in Treatment: 24 History of Present Illness HPI Description: 06/14/2021 upon evaluation today patient appears to be doing somewhat poorly in regard to the wound on his right ankle region. This was an injury that apparently occurred at work according to what he is telling me today. He is seen with his wife in the office at this point. Nonetheless the patient appears to have a circular opening with necrotic tissue in the base of the wound. This does not appear to be doing nearly as good as I would hope. Fortunately there does not appear to be any signs of infection though he does have a significant amount of necrotic tissue this is going to take some time to heal. He also has previously had surgery in this area there is a lot of scar tissue that will again complicate things and extended  healing process. The patient does have hypertension otherwise no major medical problems. 06/25/2021 upon evaluation today patient presents for reevaluation here in our clinic he actually does seem to be doing better in regard to his wound on the right lateral ankle. He has been tolerating the dressing changes without complication. Fortunately I do not see any signs of infection which is great news. 07/02/2021 upon evaluation today patient appears to be doing about the same in regard to his wound. With that being said I do believe that this is cleaner there was a little bit of slough noted we have been using Iodoflex. I was able to see a little bit more granulation tissue starting to try to build up but I think the scar tissue here is going to be the biggest slowing factor with regard to healing. That was discussed with the patient and his wife today. Nonetheless I think we want to switch up things based on what I see at this point. 07/09/2021 upon evaluation today patient appears to be doing about the same in regard to his ankle ulcer. He has been tolerating the dressing changes without complication. Fortunately there does not appear to be any signs of active infection. No fevers, chills, nausea, vomiting, or diarrhea. 07/16/2021 upon evaluation today patient appears to be doing better to some degree in regard to his wound on ankle region. He actually showed some signs of new granulation growth which is actually a good thing. Fortunately there does not appear to be any evidence of active infection and wound did not appear to be too dry upon evaluation today which is also good news. No fevers, chills, nausea, vomiting, or diarrhea. 07/23/2021 upon evaluation today patient appears to be doing a little better in regard to his wound. He has been tolerating the dressing changes without complication. Fortunately  there does not appear to be any signs of active infection which is great news. No fever chills noted we  are still waiting on the results back from the TheraSkin investigation to see whether or not he qualifies for this treatment option. 07/30/2021 upon evaluation today patient appears to be doing a little better again in regard to his wound. He is showing signs of improvement which is great news. I am very pleased with where things stand. I do believe that honestly all things considered he is making good progress but were just not able to do anything as far as advanced modalities to speed this up as insurance has literally devout denied everything including a snap VAC, Apligraf, PuraPly, and TheraSkin. Again I am not really certain what else to do otherwise other than just try to continue along the path we are as far as getting this wound to heal. 08/06/2021 upon evaluation today patient appears to be doing well currently he is of peering to make progress slowly but nonetheless this is at least making progress at this point. There does not appear to be any signs of active infection at this time. No fevers, chills, nausea, vomiting, or diarrhea. With that being said I do think that we are headed in the right direction with what I am seeing currently. I do wish we could do the snap VAC but to be honest I think this is the best we can do with the collagen as it stands currently. 08/13/2021 upon evaluation today patient unfortunately appears to be doing a little bit more poorly in regard to the fact that he is having some pain on the side of his ankle. This does have me a little bit concerned this is on the edge of the wound. There is a very hard area under this that I presume to be more of a calcification although I cannot be certain this is not just a superficial bony protrusion. I do feel it in the base of the wound but again I assumed more calcification due to inflammation from prior surgery but again that may not actually be the case. I do think that we probably need to go forward with an x-ray at this  point to see if there is anything going on that could be a more significant issue here. Depending on the results of the x-ray we may be looking at an MRI as well. 08/17/2021 upon evaluation today patient appears to be doing well with regard to his ankle ulcer. This actually is showing some signs of improvement which is great news and overall very pleased in that regard. Fortunately there does not appear to be any signs of active infection which is great news as well. No fevers, chills, nausea, vomiting, or diarrhea. With that being said the x-ray showed some surgical abnormalities and along the lateral ankle as well there was some mention of an irregularity. With that being said there was nothing specifically to indicate osteomyelitis as a probable diagnosis here. With that being said I did discuss this with the patient and his wife today their opinion is to hold off on an MRI at this point especially since his wound seems to be showing signs of healing. Fortunately there is no evidence of active infection systemically at this point. 09/03/2021 upon evaluation today patient appears to be doing well with regard to his wound. He has been tolerating the dressing changes without complication. Fortunately there does not appear to be any signs of active infection which  is also great news. In general I am very happy with where we stand. 09/17/2021 upon evaluation today patient appears to be doing well currently in regard to his wound. He has been tolerating the dressing changes without complication. Fortunately there does not appear to be any evidence of active infection which is great news. No fevers, chills, nausea, vomiting, or diarrhea. 10/01/2021 upon evaluation today patient appears to be doing well with regard to his ankle ulcer. He has been tolerating the dressing changes without complication. Fortunately there does not appear to be any signs of infection which is great and overall I am extremely pleased  with where we stand today. No fevers, chills, nausea, vomiting, or diarrhea. [name], [name] ([id number]) 10/22/2021 upon evaluation today patient appears to be doing well with regard to his wound on the ankle region. Fortunately there does not appear to be any signs of active infection at this time. I think that he has made significant improvement since he was last here and this is great news. 11/12/2021 upon evaluation today patient appears to be doing very well in regard to his wound. In fact this appears to be very close to complete resolution which is great news. I think that he has come a very long way in the time that we have been seeing him. His wife is taking great care of this. 12/03/2021 upon evaluation today patient appears to be doing okay in regard to his wound although its not quite as good as I was hoping for. He still has significant issues here with some drainage and there is a small opening towards the ankle region that has me mostly concerned at this point. I discussed this with the patient and his wife today. She helps take care of them. Fortunately there does not appear to be any signs of active infection locally nor systemically at this point. No fevers, chills, nausea, vomiting, or diarrhea. Electronic Signature(s) Signed: 12/03/2021 5:35:44 PM By: Worthy Keeler PA-C Entered By: Worthy Keeler on 12/03/2021 17:35:44 [name] ([id number]) -------------------------------------------------------------------------------- Physical Exam Details Patient Name: [name] Date of Service: 12/03/2021 2:45 PM Medical Record Number: [id number] Patient Account Number: [id number] Date of Birth/Sex: 1961/02/06 (60 y.o. M) Treating RN: Carlene Coria Primary Care Provider: Halina Maidens Other Clinician: Referring Provider: Halina Maidens Treating Provider/Extender: Jeri Cos Weeks in Treatment: 59 Constitutional Well-nourished and well-hydrated in no acute  distress. Respiratory normal breathing without difficulty. Psychiatric this patient is able to make decisions and demonstrates good insight into disease process. Alert and Oriented x 3. pleasant and cooperative. Notes Upon inspection patient's wound bed showed signs of good granulation epithelization at this point. Fortunately I do not see any evidence of significant infection which is good. He does have some drainage coming from right around the malleolus region which is a little tender as well. Overall this area I think probably needs to be packed with something like collagen I think Hydrofera Blue is good to be too extensive for this to be honest. Electronic Signature(s) Signed: 12/03/2021 5:36:18 PM By: Worthy Keeler PA-C Entered By: Worthy Keeler on 12/03/2021 17:36:18 [name] ([id number]) -------------------------------------------------------------------------------- Physician Orders Details Patient Name: [name] Date of Service: 12/03/2021 2:45 PM Medical Record Number: [id number] Patient Account Number: [id number] Date of Birth/Sex: 11-26-1961 (60 y.o. M) Treating RN: Carlene Coria Primary Care Provider: Halina Maidens Other Clinician: Referring Provider: Halina Maidens Treating Provider/Extender: Skipper Cliche in Treatment: 24 Verbal / Phone Orders: No Diagnosis Coding  ICD-10 Coding Code Description W58.099I Laceration without foreign body, right ankle, initial encounter L97.312 Non-pressure chronic ulcer of right ankle with fat layer exposed I10 Essential (primary) hypertension Follow-up Appointments o Return Appointment in 3 weeks. Bathing/ Shower/ Hygiene o May shower; gently cleanse wound with antibacterial soap, rinse and pat dry prior to dressing wounds Edema Control - Lymphedema / Segmental Compressive Device / Other o Elevate, Exercise Daily and Avoid Standing for Long Periods of Time. o Elevate legs to the level of the heart  and pump ankles as often as possible o Elevate leg(s) parallel to the floor when sitting. Wound Treatment Wound #1 - Ankle Wound Laterality: Right, Lateral Cleanser: Soap and Water 3 x Per Week/30 Days Discharge Instructions: Gently cleanse wound with antibacterial soap, rinse and pat dry prior to dressing wounds Primary Dressing: Prisma 4.34 (in) (Generic) 3 x Per Week/30 Days Discharge Instructions: Moisten w/normal saline or sterile water; Cover wound as directed. Do not remove from wound bed. Secondary Dressing: Foam Dressing, 4x4 (in/in) 3 x Per Week/30 Days Secondary Dressing: Kerlix 4.5 x 4.1 (in/yd) 3 x Per Week/30 Days Discharge Instructions: Apply Kerlix 4.5 x 4.1 (in/yd) as instructed Secondary Dressing: coban 3 x Per Week/30 Days Electronic Signature(s) Signed: 12/03/2021 6:06:35 PM By: Worthy Keeler PA-C Signed: 12/06/2021 12:20:53 PM By: Carlene Coria RN Entered By: Carlene Coria on 12/03/2021 15:59:08 [name] ([id number]) -------------------------------------------------------------------------------- Problem List Details Patient Name: [name] Date of Service: 12/03/2021 2:45 PM Medical Record Number: [id number] Patient Account Number: [id number] Date of Birth/Sex: [birth year] (60 y.o. M) Treating RN: Carlene Coria Primary Care Provider: Halina Maidens Other Clinician: Referring Provider: Halina Maidens Treating Provider/Extender: Skipper Cliche in Treatment: 24 Active Problems ICD-10 Encounter Code Description Active Date MDM Diagnosis S91.011A Laceration without foreign body, right ankle, initial encounter 06/18/2021 No Yes L97.312 Non-pressure chronic ulcer of right ankle with fat layer exposed 06/18/2021 No Yes I10 Essential (primary) hypertension 06/18/2021 No Yes Inactive Problems Resolved Problems Electronic Signature(s) Signed: 12/03/2021 2:41:42 PM By: Worthy Keeler PA-C Entered By: Worthy Keeler on 12/03/2021 14:41:42 [name] ([id number]) -------------------------------------------------------------------------------- Progress Note Details Patient Name: [name] Date of Service: 12/03/2021 2:45 PM Medical Record Number: [id number] Patient Account Number: [id number] Date of Birth/Sex: 1961-03-18 (60 y.o. M) Treating RN: Carlene Coria Primary Care Provider: Halina Maidens Other Clinician: Referring Provider: Halina Maidens Treating Provider/Extender: Skipper Cliche in Treatment: 24 Subjective Chief Complaint Information obtained from Patient Right ankle laceration History of Present Illness (HPI) 06/14/2021 upon evaluation today patient appears to be doing somewhat poorly in regard to the wound on his right ankle region. This was an injury that apparently occurred at work according to what he is telling me today. He is seen with his wife in the office at this point. Nonetheless the patient appears to have a circular opening with necrotic tissue in the base of the wound. This does not appear to be doing nearly as good as I would hope. Fortunately there does not appear to be any signs of infection though he does have a significant amount of necrotic tissue this is going to take some time to heal. He also has previously had surgery in this area there is a lot of scar tissue that will again complicate things and extended healing process. The patient does have hypertension otherwise no major medical problems. 06/25/2021 upon evaluation today patient presents for reevaluation here in our clinic he actually does seem to be doing better in regard  to his wound on the right lateral ankle. He has been tolerating the dressing changes without complication. Fortunately I do not see any signs of infection which is great news. 07/02/2021 upon evaluation today patient appears to be doing about the same in regard to his wound. With that being said I do believe that this is cleaner there was a little bit of slough  noted we have been using Iodoflex. I was able to see a little bit more granulation tissue starting to try to build up but I think the scar tissue here is going to be the biggest slowing factor with regard to healing. That was discussed with the patient and his wife today. Nonetheless I think we want to switch up things based on what I see at this point. 07/09/2021 upon evaluation today patient appears to be doing about the same in regard to his ankle ulcer. He has been tolerating the dressing changes without complication. Fortunately there does not appear to be any signs of active infection. No fevers, chills, nausea, vomiting, or diarrhea. 07/16/2021 upon evaluation today patient appears to be doing better to some degree in regard to his wound on ankle region. He actually showed some signs of new granulation growth which is actually a good thing. Fortunately there does not appear to be any evidence of active infection and wound did not appear to be too dry upon evaluation today which is also good news. No fevers, chills, nausea, vomiting, or diarrhea. 07/23/2021 upon evaluation today patient appears to be doing a little better in regard to his wound. He has been tolerating the dressing changes without complication. Fortunately there does not appear to be any signs of active infection which is great news. No fever chills noted we are still waiting on the results back from the TheraSkin investigation to see whether or not he qualifies for this treatment option. 07/30/2021 upon evaluation today patient appears to be doing a little better again in regard to his wound. He is showing signs of improvement which is great news. I am very pleased with where things stand. I do believe that honestly all things considered he is making good progress but were just not able to do anything as far as advanced modalities to speed this up as insurance has literally devout denied everything including a snap VAC, Apligraf,  PuraPly, and TheraSkin. Again I am not really certain what else to do otherwise other than just try to continue along the path we are as far as getting this wound to heal. 08/06/2021 upon evaluation today patient appears to be doing well currently he is of peering to make progress slowly but nonetheless this is at least making progress at this point. There does not appear to be any signs of active infection at this time. No fevers, chills, nausea, vomiting, or diarrhea. With that being said I do think that we are headed in the right direction with what I am seeing currently. I do wish we could do the snap VAC but to be honest I think this is the best we can do with the collagen as it stands currently. 08/13/2021 upon evaluation today patient unfortunately appears to be doing a little bit more poorly in regard to the fact that he is having some pain on the side of his ankle. This does have me a little bit concerned this is on the edge of the wound. There is a very hard area under this that I presume to be more of a calcification  although I cannot be certain this is not just a superficial bony protrusion. I do feel it in the base of the wound but again I assumed more calcification due to inflammation from prior surgery but again that may not actually be the case. I do think that we probably need to go forward with an x-ray at this point to see if there is anything going on that could be a more significant issue here. Depending on the results of the x-ray we may be looking at an MRI as well. 08/17/2021 upon evaluation today patient appears to be doing well with regard to his ankle ulcer. This actually is showing some signs of improvement which is great news and overall very pleased in that regard. Fortunately there does not appear to be any signs of active infection which is great news as well. No fevers, chills, nausea, vomiting, or diarrhea. With that being said the x-ray showed some surgical  abnormalities and along the lateral ankle as well there was some mention of an irregularity. With that being said there was nothing specifically to indicate osteomyelitis as a probable diagnosis here. With that being said I did discuss this with the patient and his wife today their opinion is to hold off on an MRI at this point especially since his wound seems to be showing signs of healing. Fortunately there is no evidence of active infection systemically at this point. 09/03/2021 upon evaluation today patient appears to be doing well with regard to his wound. He has been tolerating the dressing changes without complication. Fortunately there does not appear to be any signs of active infection which is also great news. In general I am very happy with where we stand. 09/17/2021 upon evaluation today patient appears to be doing well currently in regard to his wound. He has been tolerating the dressing changes without complication. Fortunately there does not appear to be any evidence of active infection which is great news. No fevers, chills, nausea, [name], [name] ([id number]) vomiting, or diarrhea. 10/01/2021 upon evaluation today patient appears to be doing well with regard to his ankle ulcer. He has been tolerating the dressing changes without complication. Fortunately there does not appear to be any signs of infection which is great and overall I am extremely pleased with where we stand today. No fevers, chills, nausea, vomiting, or diarrhea. 10/22/2021 upon evaluation today patient appears to be doing well with regard to his wound on the ankle region. Fortunately there does not appear to be any signs of active infection at this time. I think that he has made significant improvement since he was last here and this is great news. 11/12/2021 upon evaluation today patient appears to be doing very well in regard to his wound. In fact this appears to be very close to complete resolution which is  great news. I think that he has come a very long way in the time that we have been seeing him. His wife is taking great care of this. 12/03/2021 upon evaluation today patient appears to be doing okay in regard to his wound although its not quite as good as I was hoping for. He still has significant issues here with some drainage and there is a small opening towards the ankle region that has me mostly concerned at this point. I discussed this with the patient and his wife today. She helps take care of them. Fortunately there does not appear to be any signs of active infection locally nor systemically at this  point. No fevers, chills, nausea, vomiting, or diarrhea. Objective Constitutional Well-nourished and well-hydrated in no acute distress. Vitals Time Taken: 3:21 PM, Height: 66 in, Weight: 161 lbs, BMI: 26, Temperature: 98.5 F, Pulse: 86 bpm, Respiratory Rate: 18 breaths/min, Blood Pressure: 187/94 mmHg. Respiratory normal breathing without difficulty. Psychiatric this patient is able to make decisions and demonstrates good insight into disease process. Alert and Oriented x 3. pleasant and cooperative. General Notes: Upon inspection patient's wound bed showed signs of good granulation epithelization at this point. Fortunately I do not see any evidence of significant infection which is good. He does have some drainage coming from right around the malleolus region which is a little tender as well. Overall this area I think probably needs to be packed with something like collagen I think Hydrofera Blue is good to be too extensive for this to be honest. Integumentary (Hair, Skin) Wound #1 status is Open. Original cause of wound was Trauma. The date acquired was: 05/09/2021. The wound has been in treatment 24 weeks. The wound is located on the Right,Lateral Ankle. The wound measures 0.5cm length x 0.1cm width x 0.1cm depth; 0.039cm^2 area and 0.004cm^3 volume. There is Fat Layer (Subcutaneous  Tissue) exposed. There is no tunneling or undermining noted. There is a medium amount of serosanguineous drainage noted. There is medium (34-66%) red, pink granulation within the wound bed. There is a medium (34-66%) amount of necrotic tissue within the wound bed including Adherent Slough. Assessment Active Problems ICD-10 Laceration without foreign body, right ankle, initial encounter Non-pressure chronic ulcer of right ankle with fat layer exposed Essential (primary) hypertension Plan Follow-up Appointments: [name], [name] ([id number]) Return Appointment in 3 weeks. Bathing/ Shower/ Hygiene: May shower; gently cleanse wound with antibacterial soap, rinse and pat dry prior to dressing wounds Edema Control - Lymphedema / Segmental Compressive Device / Other: Elevate, Exercise Daily and Avoid Standing for Long Periods of Time. Elevate legs to the level of the heart and pump ankles as often as possible Elevate leg(s) parallel to the floor when sitting. WOUND #1: - Ankle Wound Laterality: Right, Lateral Cleanser: Soap and Water 3 x Per Week/30 Days Discharge Instructions: Gently cleanse wound with antibacterial soap, rinse and pat dry prior to dressing wounds Primary Dressing: Prisma 4.34 (in) (Generic) 3 x Per Week/30 Days Discharge Instructions: Moisten w/normal saline or sterile water; Cover wound as directed. Do not remove from wound bed. Secondary Dressing: Foam Dressing, 4x4 (in/in) 3 x Per Week/30 Days Secondary Dressing: Kerlix 4.5 x 4.1 (in/yd) 3 x Per Week/30 Days Discharge Instructions: Apply Kerlix 4.5 x 4.1 (in/yd) as instructed Secondary Dressing: coban 3 x Per Week/30 Days 1. I would recommend currently that we go ahead and continue with the wound care measures as before specifically with regard to the silver collagen which I think is going to be the best option here. 2. I am also can recommend that the patient also continue with changing this every other day. I think  that is appropriate. 3. I would also recommend he continue to monitor for any signs of infection such as increased pain, drainage, or otherwise if he has any issues he should let me know soon as possible. We will see patient back for reevaluation in 1 week here in the clinic. If anything worsens or changes patient will contact our office for additional recommendations. Electronic Signature(s) Signed: 12/03/2021 5:37:02 PM By: Worthy Keeler PA-C Entered By: Worthy Keeler on 12/03/2021 17:37:02 [name] ([id number]) -------------------------------------------------------------------------------- SuperBill Details  Patient Name: [name], [name] Date of Service: 12/03/2021 Medical Record Number: [id number] Patient Account Number: [id number] Date of Birth/Sex: 07-21-1961 (60 y.o. 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[place] (primary) hypertension Facility Procedures CPT4 Code: [id number] Description: [phone] - WOUND CARE VISIT-LEV 2 EST PT Modifier: Quantity: 1 Physician Procedures CPT4 Code: [id number] Description: 78478 - WC PHYS LEVEL 4 - EST PT Modifier: Quantity: 1 CPT4 Code: Description: ICD-10 Diagnosis Description S12.820S Laceration without foreign body, right ankle, initial encounter 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

## 2021-12-03 NOTE — Progress Notes (Addendum)
[name], [name] ([id number]) Visit Report for 12/03/2021 Arrival Information Details Patient Name: [name], [name] Date of Service: 12/03/2021 2:45 PM Medical Record Number: [id number] Patient Account Number: [id number] Date of Birth/Sex: 10/06/1961 (60 y.o. M) Treating RN: Carlene Coria Primary Care Vernecia Umble: Halina Maidens Other Clinician: Referring Shacoria Latif: Halina Maidens Treating Dacota Ruben/Extender: Skipper Cliche in Treatment: 24 Visit Information History Since Last Visit All ordered tests and consults were completed: No Patient Arrived: Ambulatory Added or deleted any medications: No Arrival Time: 15:17 Any new allergies or adverse reactions: No Accompanied By: self Had a fall or experienced change in No Transfer Assistance: None activities of daily living that may affect Patient Identification Verified: Yes risk of falls: Secondary Verification Process Completed: Yes Signs or symptoms of abuse/neglect since last visito No Patient Requires Transmission-Based Precautions: No Hospitalized since last visit: No Patient Has Alerts: Yes Implantable device outside of the clinic excluding No Patient Alerts: NOT diabetic cellular tissue based products placed in the center since last visit: Has Dressing in Place as Prescribed: Yes Pain Present Now: No Electronic Signature(s) Signed: 12/06/2021 12:20:53 PM By: Carlene Coria RN Entered By: Carlene Coria on 12/03/2021 15:21:45 [name] ([id number]) -------------------------------------------------------------------------------- Clinic Level of Care Assessment Details Patient Name: [name] Date of Service: 12/03/2021 2:45 PM Medical Record Number: [id number] Patient Account Number: [id number] Date of Birth/Sex: [birth year] (60 y.o. M) Treating RN: Carlene Coria Primary Care Kenyatta Keidel: Halina Maidens Other Clinician: Referring Rayvon Brandvold: Halina Maidens Treating Adriel Desrosier/Extender: Skipper Cliche in  Treatment: 24 Clinic Level of Care Assessment Items TOOL 4 Quantity Score X - Use when only an EandM is performed on FOLLOW-UP visit 1 0 ASSESSMENTS - Nursing Assessment / Reassessment X - Reassessment of Co-morbidities (includes updates in patient status) 1 10 X- 1 5 Reassessment of Adherence to Treatment Plan ASSESSMENTS - Wound and Skin Assessment / Reassessment X - Simple Wound Assessment / Reassessment - one wound 1 5 _0  - 0 Complex Wound Assessment / Reassessment - multiple wounds _1  - 0 Dermatologic / Skin Assessment (not related to wound area) ASSESSMENTS - Focused Assessment _2  - Circumferential Edema Measurements - multi extremities 0 _3  - 0 Nutritional Assessment / Counseling / Intervention _4  - 0 Lower Extremity Assessment (monofilament, tuning fork, pulses) _5  - 0 Peripheral Arterial Disease Assessment (using hand held doppler) ASSESSMENTS - Ostomy and/or Continence Assessment and Care _6  - Incontinence Assessment and Management 0 _7  - 0 Ostomy Care Assessment and Management (repouching, etc.) PROCESS - Coordination of Care X - Simple Patient / Family Education for ongoing care 1 15 _8  - 0 Complex (extensive) Patient / Family Education for ongoing care _9  - 0 Staff obtains Programmer, systems, Records, Test Results / Process Orders _10  - 0 Staff telephones HHA, Nursing Homes / Clarify orders / etc _11  - 0 Routine Transfer to another Facility (non-emergent condition) _12  - 0 Routine Hospital Admission (non-emergent condition) _13  - 0 New Admissions / Biomedical engineer / Ordering NPWT, Apligraf, etc. _14  - 0 Emergency Hospital Admission (emergent condition) X- 1 10 Simple Discharge Coordination _15  - 0 Complex (extensive) Discharge Coordination PROCESS - Special Needs _16  - Pediatric / Minor Patient Management 0 _17  - 0 Isolation Patient Management _18  - 0 Hearing / Language / Visual special needs _19  - 0 Assessment of Community assistance (transportation, D/C  planning, etc.) _20  - 0 Additional assistance / Altered mentation _21  - 0 Support Surface(s) Assessment (bed, cushion, seat, etc.) INTERVENTIONS - Wound Cleansing / Measurement [name], [name]. ([id number]) X- 1 5  Simple Wound Cleansing - one wound _0  - 0 Complex Wound Cleansing - multiple wounds X- 1 5 Wound Imaging (photographs - any number of wounds) _1  - 0 Wound Tracing (instead of photographs) X- 1 5 Simple Wound Measurement - one wound _2  - 0 Complex Wound Measurement - multiple wounds INTERVENTIONS - Wound Dressings X - Small Wound Dressing one or multiple wounds 1 10 _3  - 0 Medium Wound Dressing one or multiple wounds _4  - 0 Large Wound Dressing one or multiple wounds <[id number]>_0<\/QMVHQIONGEXBMWUX>_3  - 0 Application of Medications - topical <KGMWNUUVOZDGUYQI>_3<\/KVQQVZDGLOVFIEPP>_2  - 0 Application of Medications - injection INTERVENTIONS - Miscellaneous _7  - External ear exam 0 _8  - 0 Specimen Collection (cultures, biopsies, blood, body fluids, etc.) _9  - 0 Specimen(s) / Culture(s) sent or taken to Lab for analysis _10  - 0 Patient Transfer (multiple staff / Civil Service fast streamer / Similar devices) _11  - 0 Simple Staple / Suture removal (25 or less) _12  - 0 Complex Staple / Suture removal (26 or more) _13  - 0 Hypo / Hyperglycemic Management (close monitor of Blood Glucose) _14  - 0 Ankle / Brachial Index (ABI) - do not check if billed separately X- 1 5 Vital Signs Has the patient been seen at the hospital within the last three years: Yes Total Score: 75 Level Of Care: New/Established - Level 2 Electronic Signature(s) Signed: 12/06/2021 12:20:53 PM By: Carlene Coria RN Entered By: Carlene Coria on 12/03/2021 16:01:42 [name] ([id number]) -------------------------------------------------------------------------------- Encounter Discharge Information Details Patient Name: [name] Date of Service: 12/03/2021 2:45 PM Medical Record Number: [id number] Patient Account Number: [id number] Date of Birth/Sex: 02/23/1961 (60 y.o.  M) Treating RN: Carlene Coria Primary Care Harry Shuck: Halina Maidens Other Clinician: Referring Felice Hope: Halina Maidens Treating Gilford Lardizabal/Extender: Skipper Cliche in Treatment: 24 Encounter Discharge Information Items Discharge Condition: Stable Ambulatory Status: Ambulatory Discharge Destination: Home Transportation: Private Auto Accompanied By: self Schedule Follow-up Appointment: Yes Clinical Summary of Care: Patient Declined Electronic Signature(s) Signed: 12/06/2021 12:20:53 PM By: Carlene Coria RN Entered By: Carlene Coria on 12/03/2021 16:02:41 [name] ([id number]) -------------------------------------------------------------------------------- Lower Extremity Assessment Details Patient Name: [name] Date of Service: 12/03/2021 2:45 PM Medical Record Number: [id number] Patient Account Number: [id number] Date of Birth/Sex: Dec 01, 1961 (60 y.o. M) Treating RN: Carlene Coria Primary Care Nidal Rivet: Halina Maidens Other Clinician: Referring Kendrew Paci: Halina Maidens Treating Edahi Kroening/Extender: Jeri Cos Weeks in Treatment: 24 Edema Assessment Assessed: [Left: No] [Right: No] Edema: [Left: Ye] [Right: s] Vascular Assessment Pulses: Dorsalis Pedis Palpable: [Right:Yes] Electronic Signature(s) Signed: 12/06/2021 12:20:53 PM By: Carlene Coria RN Entered By: Carlene Coria on 12/03/2021 15:28:03 [name] ([id number]) -------------------------------------------------------------------------------- Multi Wound Chart Details Patient Name: [name] Date of Service: 12/03/2021 2:45 PM Medical Record Number: [id number] Patient Account Number: [id number] Date of Birth/Sex: [birth year] (60 y.o. M) Treating RN: Carlene Coria Primary Care Jerica Creegan: Halina Maidens Other Clinician: Referring Emalyn Schou: Halina Maidens Treating Odie Edmonds/Extender: Skipper Cliche in Treatment: 24 Vital Signs Height(in): 66 Pulse(bpm): 86 Weight(lbs): 161 Blood  Pressure(mmHg): 187/94 Body Mass Index(BMI): 26 Temperature(F): 98.5 Respiratory Rate(breaths/min): 18 Photos: [N/A:N/A] Wound Location: Right, Lateral Ankle N/A N/A Wounding Event: Trauma N/A N/A Primary Etiology: Trauma, Other N/A N/A Comorbid History: Hypertension N/A N/A Date Acquired: 05/09/2021 N/A N/A Weeks of Treatment: 24 N/A N/A Wound Status: Open N/A N/A Measurements L x W x D (cm) 0.5x0.1x0.1 N/A N/A Area (cm) : 0.039 N/A N/A Volume (cm) : 0.004 N/A N/A % Reduction in Area: 95.00% N/A N/A % Reduction in Volume: 99.00% N/A N/A Classification:  Full Thickness Without Exposed N/A N/A Support Structures Exudate Amount: Medium N/A N/A Exudate Type: Serosanguineous N/A N/A Exudate Color: red, brown N/A N/A Granulation Amount: Medium (34-66%) N/A N/A Granulation Quality: Red, Pink N/A N/A Necrotic Amount: Medium (34-66%) N/A N/A Exposed Structures: Fat Layer (Subcutaneous Tissue): N/A N/A Yes Fascia: No Tendon: No Muscle: No Joint: No Bone: No Epithelialization: None N/A N/A Treatment Notes Electronic Signature(s) Signed: 12/06/2021 12:20:53 PM By: Carlene Coria RN Entered By: Carlene Coria on 12/03/2021 15:48:23 [name] ([id number]) -------------------------------------------------------------------------------- [facility] Details Patient Name: [name] Date of Service: 12/03/2021 2:45 PM Medical Record Number: [id number] Patient Account Number: [id number] Date of Birth/Sex: [birth year] (60 y.o. M) Treating RN: Carlene Coria Primary Care Alisen Marsiglia: Halina Maidens Other Clinician: Referring Chloeann Alfred: Halina Maidens Treating Marrell Dicaprio/Extender: Skipper Cliche in Treatment: 24 Active Inactive Wound/Skin Impairment Nursing Diagnoses: Knowledge deficit related to ulceration/compromised skin integrity Goals: Patient/caregiver will verbalize understanding of skin care regimen Date Initiated: 06/18/2021 Target Resolution Date:  09/18/2021 Goal Status: Active Ulcer/skin breakdown will have a volume reduction of 30% by week 4 Date Initiated: 06/18/2021 Date Inactivated: 07/16/2021 Target Resolution Date: 07/18/2021 Goal Status: Unmet Unmet Reason: comorbities Ulcer/skin breakdown will have a volume reduction of 50% by week 8 Date Initiated: 06/18/2021 Date Inactivated: 08/20/2021 Target Resolution Date: 08/18/2021 Goal Status: Unmet Unmet Reason: comorbites Ulcer/skin breakdown will have a volume reduction of 80% by week 12 Date Initiated: 06/18/2021 Date Inactivated: 10/01/2021 Target Resolution Date: 09/18/2021 Goal Status: Met Ulcer/skin breakdown will heal within 14 weeks Date Initiated: 06/18/2021 Target Resolution Date: 10/18/2021 Goal Status: Active Interventions: Assess patient/caregiver ability to obtain necessary supplies Assess patient/caregiver ability to perform ulcer/skin care regimen upon admission and as needed Assess ulceration(s) every visit Notes: Electronic Signature(s) Signed: 12/06/2021 12:20:53 PM By: Carlene Coria RN Entered By: Carlene Coria on 12/03/2021 15:48:08 [name] ([id number]) -------------------------------------------------------------------------------- Pain Assessment Details Patient Name: [name] Date of Service: 12/03/2021 2:45 PM Medical Record Number: [id number] Patient Account Number: [id number] Date of Birth/Sex: 1961-02-28 (60 y.o. M) Treating RN: Carlene Coria Primary Care Zeven Kocak: Halina Maidens Other Clinician: Referring Josph Norfleet: Halina Maidens Treating Shalen Petrak/Extender: Skipper Cliche in Treatment: 24 Active Problems Location of Pain Severity and Description of Pain Patient Has Paino No Site Locations Pain Management and Medication Current Pain Management: Electronic Signature(s) Signed: 12/06/2021 12:20:53 PM By: Carlene Coria RN Entered By: Carlene Coria on 12/03/2021 15:22:30 [name]  ([id number]) -------------------------------------------------------------------------------- Patient/Caregiver Education Details Patient Name: [name] Date of Service: 12/03/2021 2:45 PM Medical Record Number: [id number] Patient Account Number: [id number] Date of Birth/Gender: 20-Sep-1961 (60 y.o. M) Treating RN: Carlene Coria Primary Care Physician: Halina Maidens Other Clinician: Referring Physician: Halina Maidens Treating Physician/Extender: Skipper Cliche in Treatment: 24 Education Assessment Education Provided To: Patient Education Topics Provided Wound/Skin Impairment: Methods: Explain/Verbal Responses: State content correctly Electronic Signature(s) Signed: 12/06/2021 12:20:53 PM By: Carlene Coria RN Entered By: Carlene Coria on 12/03/2021 16:01:58 [name] ([id number]) -------------------------------------------------------------------------------- Wound Assessment Details Patient Name: [name] Date of Service: 12/03/2021 2:45 PM Medical Record Number: [id number] Patient Account Number: [id number] Date of Birth/Sex: [birth year] (60 y.o. M) Treating RN: Carlene Coria Primary Care Latavious Bitter: Halina Maidens Other Clinician: Referring Laconya Clere: Halina Maidens Treating Monserrath Junio/Extender: Jeri Cos Weeks in Treatment: 24 Wound Status Wound Number: 1 Primary Etiology: Trauma, Other Wound Location: Right, Lateral Ankle Wound Status: Open Wounding Event: Trauma Comorbid History: Hypertension Date Acquired: 05/09/2021 Weeks Of Treatment: 24 Clustered Wound: No Photos Wound Measurements Length: (cm)  0.5 Width: (cm) 0.1 Depth: (cm) 0.1 Area: (cm) 0.039 Volume: (cm) 0.004 % Reduction in Area: 95% % Reduction in Volume: 99% Epithelialization: None Tunneling: No Undermining: No Wound Description Classification: Full Thickness Without Exposed Support Structu Exudate Amount: Medium Exudate Type: Serosanguineous Exudate Color: red,  brown res Foul Odor After Cleansing: No Slough/Fibrino Yes Wound Bed Granulation Amount: Medium (34-66%) Exposed Structure Granulation Quality: Red, Pink Fascia Exposed: No Necrotic Amount: Medium (34-66%) Fat Layer (Subcutaneous Tissue) Exposed: Yes Necrotic Quality: Adherent Slough Tendon Exposed: No Muscle Exposed: No Joint Exposed: No Bone Exposed: No Treatment Notes Wound #1 (Ankle) Wound Laterality: Right, Lateral Cleanser Soap and Water Discharge Instruction: Gently cleanse wound with antibacterial soap, rinse and pat dry prior to dressing wounds Peri-Wound Care [name], [name] ([id number]) Topical Primary Dressing Prisma 4.34 (in) Discharge Instruction: Moisten w/normal saline or sterile water; Cover wound as directed. Do not remove from wound bed. Secondary Dressing Foam Dressing, 4x4 (in/in) Kerlix 4.5 x 4.1 (in/yd) Discharge Instruction: Apply Kerlix 4.5 x 4.1 (in/yd) as instructed coban Secured With Compression Wrap Compression Stockings Add-Ons Electronic Signature(s) Signed: 12/06/2021 12:20:53 PM By: Carlene Coria RN Entered By: Carlene Coria on 12/03/2021 15:27:18 [name] ([id number]) -------------------------------------------------------------------------------- [facility] Details Patient Name: [name] Date of Service: 12/03/2021 2:45 PM Medical Record Number: [id number] Patient Account Number: [id number] Date of Birth/Sex: September 08, 1961 (60 y.o. M) Treating RN: Carlene Coria Primary Care Akeya Ryther: Halina Maidens Other Clinician: Referring Haliyah Fryman: Halina Maidens Treating Temiloluwa Recchia/Extender: Skipper Cliche in Treatment: 24 Vital Signs Time Taken: 15:21 Temperature (F): 98.5 Height (in): 66 Pulse (bpm): 86 Weight (lbs): 161 Respiratory Rate (breaths/min): 18 Body Mass Index (BMI): 26 Blood Pressure (mmHg): 187/94 Reference Range: 80 - 120 mg / dl Electronic Signature(s) Signed: 12/06/2021 12:20:53 PM By: Carlene Coria  RN Entered By: Carlene Coria on 12/03/2021 15:22:19

## 2021-12-24 ENCOUNTER — Encounter: Payer: 59 | Admitting: Internal Medicine

## 2021-12-24 ENCOUNTER — Other Ambulatory Visit: Payer: Self-pay

## 2021-12-24 DIAGNOSIS — S91011A Laceration without foreign body, right ankle, initial encounter: Secondary | ICD-10-CM | POA: Diagnosis not present

## 2021-12-24 NOTE — Progress Notes (Signed)
MAXIMILLIAN, HABIBI ([id number]) Visit Report for 12/24/2021 Debridement Details Patient Name: [name], [name]. Date of Service: 12/24/2021 1:15 PM Medical Record Number: [id number] Patient Account Number: [id number] Date of Birth/Sex: January 11, 1961 (60 y.o. M) Treating RN: Carlene Coria Primary Care Provider: Halina Maidens Other Clinician: Referring Provider: Halina Maidens Treating Provider/Extender: Tito Dine in Treatment: 27 Debridement Performed for Wound #1 Right,Lateral Ankle Assessment: Performed By: Physician Ricard Dillon, MD Debridement Type: Debridement Level of Consciousness (Pre- Awake and Alert procedure): Pre-procedure Verification/Time Out Yes - 13:45 Taken: Pain Control: Lidocaine 4% Topical Solution Total Area Debrided (L x W): 1.3 (cm) x 1.5 (cm) = 1.95 (cm) Tissue and other material Non-Viable, Eschar debrided: Level: Non-Viable Tissue Debridement Description: Selective/Open Wound Instrument: Curette Bleeding: Minimum Hemostasis Achieved: Pressure Response to Treatment: Procedure was tolerated well Level of Consciousness (Post- Awake and Alert procedure): Post Debridement Measurements of Total Wound Length: (cm) 1.3 Width: (cm) 1.5 Depth: (cm) 0.2 Volume: (cm) 0.306 Character of Wound/Ulcer Post Debridement: Stable Post Procedure Diagnosis Same as Pre-procedure Electronic Signature(s) Signed: 12/24/2021 4:08:28 PM By: Linton Ham MD Signed: 12/24/2021 4:17:41 PM By: Carlene Coria RN Entered By: Linton Ham on 12/24/2021 13:57:58 [name] ([id number]) -------------------------------------------------------------------------------- HPI Details Patient Name: [name] Date of Service: 12/24/2021 1:15 PM Medical Record Number: [id number] Patient Account Number: [id number] Date of Birth/Sex: Feb 17, 1961 (60 y.o. M) Treating RN: Carlene Coria Primary Care Provider: Halina Maidens Other Clinician: Referring  Provider: Halina Maidens Treating Provider/Extender: Tito Dine in Treatment: 36 History of Present Illness HPI Description: 06/14/2021 upon evaluation today patient appears to be doing somewhat poorly in regard to the wound on his right ankle region. This was an injury that apparently occurred at work according to what he is telling me today. He is seen with his wife in the office at this point. Nonetheless the patient appears to have a circular opening with necrotic tissue in the base of the wound. This does not appear to be doing nearly as good as I would hope. Fortunately there does not appear to be any signs of infection though he does have a significant amount of necrotic tissue this is going to take some time to heal. He also has previously had surgery in this area there is a lot of scar tissue that will again complicate things and extended healing process. The patient does have hypertension otherwise no major medical problems. 06/25/2021 upon evaluation today patient presents for reevaluation here in our clinic he actually does seem to be doing better in regard to his wound on the right lateral ankle. He has been tolerating the dressing changes without complication. Fortunately I do not see any signs of infection which is great news. 07/02/2021 upon evaluation today patient appears to be doing about the same in regard to his wound. With that being said I do believe that this is cleaner there was a little bit of slough noted we have been using Iodoflex. I was able to see a little bit more granulation tissue starting to try to build up but I think the scar tissue here is going to be the biggest slowing factor with regard to healing. That was discussed with the patient and his wife today. Nonetheless I think we want to switch up things based on what I see at this point. 07/09/2021 upon evaluation today patient appears to be doing about the same in regard to his ankle ulcer. He has been  tolerating the dressing changes without complication. Fortunately  there does not appear to be any signs of active infection. No fevers, chills, nausea, vomiting, or diarrhea. 07/16/2021 upon evaluation today patient appears to be doing better to some degree in regard to his wound on ankle region. He actually showed some signs of new granulation growth which is actually a good thing. Fortunately there does not appear to be any evidence of active infection and wound did not appear to be too dry upon evaluation today which is also good news. No fevers, chills, nausea, vomiting, or diarrhea. 07/23/2021 upon evaluation today patient appears to be doing a little better in regard to his wound. He has been tolerating the dressing changes without complication. Fortunately there does not appear to be any signs of active infection which is great news. No fever chills noted we are still waiting on the results back from the TheraSkin investigation to see whether or not he qualifies for this treatment option. 07/30/2021 upon evaluation today patient appears to be doing a little better again in regard to his wound. He is showing signs of improvement which is great news. I am very pleased with where things stand. I do believe that honestly all things considered he is making good progress but were just not able to do anything as far as advanced modalities to speed this up as insurance has literally devout denied everything including a snap VAC, Apligraf, PuraPly, and TheraSkin. Again I am not really certain what else to do otherwise other than just try to continue along the path we are as far as getting this wound to heal. 08/06/2021 upon evaluation today patient appears to be doing well currently he is of peering to make progress slowly but nonetheless this is at least making progress at this point. There does not appear to be any signs of active infection at this time. No fevers, chills, nausea, vomiting, or diarrhea.  With that being said I do think that we are headed in the right direction with what I am seeing currently. I do wish we could do the snap VAC but to be honest I think this is the best we can do with the collagen as it stands currently. 08/13/2021 upon evaluation today patient unfortunately appears to be doing a little bit more poorly in regard to the fact that he is having some pain on the side of his ankle. This does have me a little bit concerned this is on the edge of the wound. There is a very hard area under this that I presume to be more of a calcification although I cannot be certain this is not just a superficial bony protrusion. I do feel it in the base of the wound but again I assumed more calcification due to inflammation from prior surgery but again that may not actually be the case. I do think that we probably need to go forward with an x-ray at this point to see if there is anything going on that could be a more significant issue here. Depending on the results of the x-ray we may be looking at an MRI as well. 08/17/2021 upon evaluation today patient appears to be doing well with regard to his ankle ulcer. This actually is showing some signs of improvement which is great news and overall very pleased in that regard. Fortunately there does not appear to be any signs of active infection which is great news as well. No fevers, chills, nausea, vomiting, or diarrhea. With that being said the x-ray showed some surgical abnormalities and  along the lateral ankle as well there was some mention of an irregularity. With that being said there was nothing specifically to indicate osteomyelitis as a probable diagnosis here. With that being said I did discuss this with the patient and his wife today their opinion is to hold off on an MRI at this point especially since his wound seems to be showing signs of healing. Fortunately there is no evidence of active infection systemically at this point. 09/03/2021  upon evaluation today patient appears to be doing well with regard to his wound. He has been tolerating the dressing changes without complication. Fortunately there does not appear to be any signs of active infection which is also great news. In general I am very happy with where we stand. 09/17/2021 upon evaluation today patient appears to be doing well currently in regard to his wound. He has been tolerating the dressing changes without complication. Fortunately there does not appear to be any evidence of active infection which is great news. No fevers, chills, nausea, vomiting, or diarrhea. 10/01/2021 upon evaluation today patient appears to be doing well with regard to his ankle ulcer. He has been tolerating the dressing changes without complication. Fortunately there does not appear to be any signs of infection which is great and overall I am extremely pleased with where we stand today. No fevers, chills, nausea, vomiting, or diarrhea. [name], [name] ([id number]) 10/22/2021 upon evaluation today patient appears to be doing well with regard to his wound on the ankle region. Fortunately there does not appear to be any signs of active infection at this time. I think that he has made significant improvement since he was last here and this is great news. 11/12/2021 upon evaluation today patient appears to be doing very well in regard to his wound. In fact this appears to be very close to complete resolution which is great news. I think that he has come a very long way in the time that we have been seeing him. His wife is taking great care of this. 12/03/2021 upon evaluation today patient appears to be doing okay in regard to his wound although its not quite as good as I was hoping for. He still has significant issues here with some drainage and there is a small opening towards the ankle region that has me mostly concerned at this point. I discussed this with the patient and his wife today. She  helps take care of them. Fortunately there does not appear to be any signs of active infection locally nor systemically at this point. No fevers, chills, nausea, vomiting, or diarrhea. 12/30; patient with a traumatic wound on the right lateral malleolus in the setting of scar tissue from a remote injury and surgery in this area. He has been using silver collagen changing 3 times a week but moistening this only with water/distilled water. He came in with quite a bit of eschar on the wound surface Electronic Signature(s) Signed: 12/24/2021 4:08:28 PM By: Linton Ham MD Entered By: Linton Ham on 12/24/2021 13:53:21 [name] ([id number]) -------------------------------------------------------------------------------- Physical Exam Details Patient Name: [name] Date of Service: 12/24/2021 1:15 PM Medical Record Number: [id number] Patient Account Number: [id number] Date of Birth/Sex: 1961/08/20 (60 y.o. M) Treating RN: Carlene Coria Primary Care Provider: Halina Maidens Other Clinician: Referring Provider: Halina Maidens Treating Provider/Extender: Tito Dine in Treatment: 27 Constitutional Sitting or standing Blood Pressure is within target range for patient.. Pulse regular and within target range for patient.Marland Kitchen Respirations regular, non-  labored and within target range.Marland Kitchen appears in no distress. Cardiovascular Peripheral pulses are palpable. Notes Wound exam; right lateral malleolus. No evidence of infection. Using a #3 curette I removed surface eschar gently. After removal there is only 1 small open area remaining. No evidence of infection Electronic Signature(s) Signed: 12/24/2021 4:08:28 PM By: Linton Ham MD Entered By: Linton Ham on 12/24/2021 13:54:25 [name] ([id number]) -------------------------------------------------------------------------------- Physician Orders Details Patient Name: [name] Date of  Service: 12/24/2021 1:15 PM Medical Record Number: [id number] Patient Account Number: [id number] Date of Birth/Sex: 16-Jun-1961 (60 y.o. M) Treating RN: Levora Dredge Primary Care Provider: Halina Maidens Other Clinician: Referring Provider: Halina Maidens Treating Provider/Extender: Tito Dine in Treatment: 42 Verbal / Phone Orders: No Diagnosis Coding Follow-up Appointments o Return Appointment in 2 weeks. Bathing/ Shower/ Hygiene o May shower; gently cleanse wound with antibacterial soap, rinse and pat dry prior to dressing wounds Edema Control - Lymphedema / Segmental Compressive Device / Other o Elevate, Exercise Daily and Avoid Standing for Long Periods of Time. o Elevate legs to the level of the heart and pump ankles as often as possible o Elevate leg(s) parallel to the floor when sitting. Wound Treatment Wound #1 - Ankle Wound Laterality: Right, Lateral Cleanser: Soap and Water 3 x Per Day/30 Days Discharge Instructions: Gently cleanse wound with antibacterial soap, rinse and pat dry prior to dressing wounds Primary Dressing: Silvercel Small 2x2 (in/in) 3 x Per Day/30 Days Discharge Instructions: Apply Silvercel Small 2x2 (in/in) as instructed Secondary Dressing: Zetuvit Plus Silicone Border Dressing 4x4 (in/in) 3 x Per Day/30 Days Electronic Signature(s) Signed: 12/24/2021 4:08:28 PM By: Linton Ham MD Signed: 12/24/2021 4:39:16 PM By: Levora Dredge Entered By: Levora Dredge on 12/24/2021 13:56:28 [name] ([id number]) -------------------------------------------------------------------------------- Problem List Details Patient Name: [name] Date of Service: 12/24/2021 1:15 PM Medical Record Number: [id number] Patient Account Number: [id number] Date of Birth/Sex: 18-Aug-1961 (60 y.o. M) Treating RN: Carlene Coria Primary Care Provider: Halina Maidens Other Clinician: Referring Provider: Halina Maidens Treating  Provider/Extender: Tito Dine in Treatment: 27 Active Problems ICD-10 Encounter Code Description Active Date MDM Diagnosis S91.011A Laceration without foreign body, right ankle, initial encounter 06/18/2021 No Yes L97.312 Non-pressure chronic ulcer of right ankle with fat layer exposed 06/18/2021 No Yes I10 Essential (primary) hypertension 06/18/2021 No Yes Inactive Problems Resolved Problems Electronic Signature(s) Signed: 12/24/2021 4:08:28 PM By: Linton Ham MD Entered By: Linton Ham on 12/24/2021 13:51:16 [name] ([id number]) -------------------------------------------------------------------------------- Progress Note Details Patient Name: [name] Date of Service: 12/24/2021 1:15 PM Medical Record Number: [id number] Patient Account Number: [id number] Date of Birth/Sex: Mar 11, 1961 (60 y.o. M) Treating RN: Carlene Coria Primary Care Provider: Halina Maidens Other Clinician: Referring Provider: Halina Maidens Treating Provider/Extender: Tito Dine in Treatment: 27 Subjective History of Present Illness (HPI) 06/14/2021 upon evaluation today patient appears to be doing somewhat poorly in regard to the wound on his right ankle region. This was an injury that apparently occurred at work according to what he is telling me today. He is seen with his wife in the office at this point. Nonetheless the patient appears to have a circular opening with necrotic tissue in the base of the wound. This does not appear to be doing nearly as good as I would hope. Fortunately there does not appear to be any signs of infection though he does have a significant amount of necrotic tissue this is going to take some time to heal. He also has  previously had surgery in this area there is a lot of scar tissue that will again complicate things and extended healing process. The patient does have hypertension otherwise no major medical problems. 06/25/2021 upon  evaluation today patient presents for reevaluation here in our clinic he actually does seem to be doing better in regard to his wound on the right lateral ankle. He has been tolerating the dressing changes without complication. Fortunately I do not see any signs of infection which is great news. 07/02/2021 upon evaluation today patient appears to be doing about the same in regard to his wound. With that being said I do believe that this is cleaner there was a little bit of slough noted we have been using Iodoflex. I was able to see a little bit more granulation tissue starting to try to build up but I think the scar tissue here is going to be the biggest slowing factor with regard to healing. That was discussed with the patient and his wife today. Nonetheless I think we want to switch up things based on what I see at this point. 07/09/2021 upon evaluation today patient appears to be doing about the same in regard to his ankle ulcer. He has been tolerating the dressing changes without complication. Fortunately there does not appear to be any signs of active infection. No fevers, chills, nausea, vomiting, or diarrhea. 07/16/2021 upon evaluation today patient appears to be doing better to some degree in regard to his wound on ankle region. He actually showed some signs of new granulation growth which is actually a good thing. Fortunately there does not appear to be any evidence of active infection and wound did not appear to be too dry upon evaluation today which is also good news. No fevers, chills, nausea, vomiting, or diarrhea. 07/23/2021 upon evaluation today patient appears to be doing a little better in regard to his wound. He has been tolerating the dressing changes without complication. Fortunately there does not appear to be any signs of active infection which is great news. No fever chills noted we are still waiting on the results back from the TheraSkin investigation to see whether or not he  qualifies for this treatment option. 07/30/2021 upon evaluation today patient appears to be doing a little better again in regard to his wound. He is showing signs of improvement which is great news. I am very pleased with where things stand. I do believe that honestly all things considered he is making good progress but were just not able to do anything as far as advanced modalities to speed this up as insurance has literally devout denied everything including a snap VAC, Apligraf, PuraPly, and TheraSkin. Again I am not really certain what else to do otherwise other than just try to continue along the path we are as far as getting this wound to heal. 08/06/2021 upon evaluation today patient appears to be doing well currently he is of peering to make progress slowly but nonetheless this is at least making progress at this point. There does not appear to be any signs of active infection at this time. No fevers, chills, nausea, vomiting, or diarrhea. With that being said I do think that we are headed in the right direction with what I am seeing currently. I do wish we could do the snap VAC but to be honest I think this is the best we can do with the collagen as it stands currently. 08/13/2021 upon evaluation today patient unfortunately appears to be doing a  little bit more poorly in regard to the fact that he is having some pain on the side of his ankle. This does have me a little bit concerned this is on the edge of the wound. There is a very hard area under this that I presume to be more of a calcification although I cannot be certain this is not just a superficial bony protrusion. I do feel it in the base of the wound but again I assumed more calcification due to inflammation from prior surgery but again that may not actually be the case. I do think that we probably need to go forward with an x-ray at this point to see if there is anything going on that could be a more significant issue here. Depending  on the results of the x-ray we may be looking at an MRI as well. 08/17/2021 upon evaluation today patient appears to be doing well with regard to his ankle ulcer. This actually is showing some signs of improvement which is great news and overall very pleased in that regard. Fortunately there does not appear to be any signs of active infection which is great news as well. No fevers, chills, nausea, vomiting, or diarrhea. With that being said the x-ray showed some surgical abnormalities and along the lateral ankle as well there was some mention of an irregularity. With that being said there was nothing specifically to indicate osteomyelitis as a probable diagnosis here. With that being said I did discuss this with the patient and his wife today their opinion is to hold off on an MRI at this point especially since his wound seems to be showing signs of healing. Fortunately there is no evidence of active infection systemically at this point. 09/03/2021 upon evaluation today patient appears to be doing well with regard to his wound. He has been tolerating the dressing changes without complication. Fortunately there does not appear to be any signs of active infection which is also great news. In general I am very happy with where we stand. 09/17/2021 upon evaluation today patient appears to be doing well currently in regard to his wound. He has been tolerating the dressing changes without complication. Fortunately there does not appear to be any evidence of active infection which is great news. No fevers, chills, nausea, vomiting, or diarrhea. 10/01/2021 upon evaluation today patient appears to be doing well with regard to his ankle ulcer. He has been tolerating the dressing changes without complication. Fortunately there does not appear to be any signs of infection which is great and overall I am extremely pleased with where we stand today. No fevers, chills, nausea, vomiting, or diarrhea. [name], [name]  ([id number]) 10/22/2021 upon evaluation today patient appears to be doing well with regard to his wound on the ankle region. Fortunately there does not appear to be any signs of active infection at this time. I think that he has made significant improvement since he was last here and this is great news. 11/12/2021 upon evaluation today patient appears to be doing very well in regard to his wound. In fact this appears to be very close to complete resolution which is great news. I think that he has come a very long way in the time that we have been seeing him. His wife is taking great care of this. 12/03/2021 upon evaluation today patient appears to be doing okay in regard to his wound although its not quite as good as I was hoping for. He still has significant issues here  with some drainage and there is a small opening towards the ankle region that has me mostly concerned at this point. I discussed this with the patient and his wife today. She helps take care of them. Fortunately there does not appear to be any signs of active infection locally nor systemically at this point. No fevers, chills, nausea, vomiting, or diarrhea. 12/30; patient with a traumatic wound on the right lateral malleolus in the setting of scar tissue from a remote injury and surgery in this area. He has been using silver collagen changing 3 times a week but moistening this only with water/distilled water. He came in with quite a bit of eschar on the wound surface Objective Constitutional Sitting or standing Blood Pressure is within target range for patient.. Pulse regular and within target range for patient.Marland Kitchen Respirations regular, non- labored and within target range.Marland Kitchen appears in no distress. Vitals Time Taken: 1:31 PM, Height: 66 in, Weight: 161 lbs, BMI: 26, Temperature: 98.5 F, Pulse: 69 bpm, Respiratory Rate: 18 breaths/min, Blood Pressure: 133/81 mmHg. Cardiovascular Peripheral pulses are palpable. General Notes: Wound  exam; right lateral malleolus. No evidence of infection. Using a #3 curette I removed surface eschar gently. After removal there is only 1 small open area remaining. No evidence of infection Integumentary (Hair, Skin) Wound #1 status is Open. Original cause of wound was Trauma. The date acquired was: 05/09/2021. The wound has been in treatment 27 weeks. The wound is located on the Right,Lateral Ankle. The wound measures 1.3cm length x 1.5cm width x 0.2cm depth; 1.532cm^2 area and 0.306cm^3 volume. There is no tunneling or undermining noted. There is a none present amount of drainage noted. There is no granulation within the wound bed. There is a large (67-100%) amount of necrotic tissue within the wound bed including Eschar. Assessment Active Problems ICD-10 Laceration without foreign body, right ankle, initial encounter Non-pressure chronic ulcer of right ankle with fat layer exposed Essential (primary) hypertension Procedures Wound #1 Pre-procedure diagnosis of Wound #1 is a Trauma, Other located on the Right,Lateral Ankle . There was a Selective/Open Wound Non-Viable Tissue Debridement with a total area of 1.95 sq cm performed by Ricard Dillon, MD. With the following instrument(s): Curette to remove Non-Viable tissue/material. Material removed includes Eschar after achieving pain control using Lidocaine 4% Topical Solution. No specimens were taken. A time out was conducted at 13:45, prior to the start of the procedure. A Minimum amount of bleeding was controlled with Pressure. The procedure was tolerated well. Post Debridement Measurements: 1.3cm length x 1.5cm width x 0.2cm depth; 0.306cm^3 volume. Character of Wound/Ulcer Post Debridement is stable. Post procedure Diagnosis Wound #1: Same as Pre-Procedure [name], [name]. ([id number]) Plan 1. I changed the primary dressing here to silver alginate change every second day this should be healed by the next time we see him Electronic  Signature(s) Signed: 12/24/2021 4:08:28 PM By: Linton Ham MD Entered By: Linton Ham on 12/24/2021 13:56:56 [name] ([id number]) -------------------------------------------------------------------------------- [place] Details Patient Name: [name] Date of Service: 12/24/2021 Medical Record Number: [id number] Patient Account Number: [id number] Date of Birth/Sex: July 31, 1961 (60 y.o. 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 [facility] (primary) hypertension Facility Procedures CPT4 Code: 38250539 Description: [phone] - DEBRIDE WOUND 1ST  20 SQ CM OR < Modifier: Quantity: 1 CPT4 Code: Description: ICD-10 Diagnosis Description J67.341 Non-pressure chronic ulcer of right ankle with fat layer exposed Modifier: Quantity: Physician Procedures CPT4 Code: 9379024 Description: 09735 - WC PHYS DEBR WO ANESTH 20 SQ CM Modifier: Quantity: 1 CPT4 Code: Description: ICD-10 Diagnosis Description H29.924 Non-pressure chronic ulcer of right ankle with fat layer exposed Modifier: Quantity: Electronic Signature(s) Signed: 12/24/2021 4:08:28 PM By: Linton Ham MD Entered By: Linton Ham on 12/24/2021 13:57:13

## 2021-12-24 NOTE — Progress Notes (Signed)
[name], [name] ([id number]) Visit Report for 12/24/2021 Arrival Information Details Patient Name: [name], [name] Date of Service: 12/24/2021 1:15 PM Medical Record Number: [id number] Patient Account Number: [id number] Date of Birth/Sex: [birth year] (60 y.o. M) Treating RN: Levora Dredge Primary Care Kendricks Reap: Halina Maidens Other Clinician: Referring Landa Mullinax: Halina Maidens Treating Rondalyn Belford/Extender: Tito Dine in Treatment: 78 Visit Information History Since Last Visit Added or deleted any medications: No Patient Arrived: Ambulatory Any new allergies or adverse reactions: No Arrival Time: 13:27 Had a fall or experienced change in No Accompanied By: wife activities of daily living that may affect Transfer Assistance: None risk of falls: Patient Identification Verified: Yes Hospitalized since last visit: No Secondary Verification Process Completed: Yes Has Dressing in Place as Prescribed: Yes Patient Requires Transmission-Based Precautions: No Pain Present Now: No Patient Has Alerts: Yes Patient Alerts: NOT diabetic Electronic Signature(s) Signed: 12/24/2021 4:39:16 PM By: Levora Dredge Entered By: Levora Dredge on 12/24/2021 13:30:41 [name] ([id number]) -------------------------------------------------------------------------------- Clinic Level of Care Assessment Details Patient Name: [name] Date of Service: 12/24/2021 1:15 PM Medical Record Number: [id number] Patient Account Number: [id number] Date of Birth/Sex: 12-06-1961 (60 y.o. M) Treating RN: Levora Dredge Primary Care Vinia Jemmott: Halina Maidens Other Clinician: Referring Taos Tapp: Halina Maidens Treating Vegas Coffin/Extender: Tito Dine in Treatment: 27 Clinic Level of Care Assessment Items TOOL 1 Quantity Score []  - Use when EandM and Procedure is performed on INITIAL visit 0 ASSESSMENTS - Nursing Assessment / Reassessment []  - General Physical Exam  (combine w/ comprehensive assessment (listed just below) when performed on new 0 pt. evals) []  - 0 Comprehensive Assessment (HX, ROS, Risk Assessments, Wounds Hx, etc.) ASSESSMENTS - Wound and Skin Assessment / Reassessment []  - Dermatologic / Skin Assessment (not related to wound area) 0 ASSESSMENTS - Ostomy and/or Continence Assessment and Care []  - Incontinence Assessment and Management 0 []  - 0 Ostomy Care Assessment and Management (repouching, etc.) PROCESS - Coordination of Care []  - Simple Patient / Family Education for ongoing care 0 []  - 0 Complex (extensive) Patient / Family Education for ongoing care []  - 0 Staff obtains Programmer, systems, Records, Test Results / Process Orders []  - 0 Staff telephones HHA, Nursing Homes / Clarify orders / etc []  - 0 Routine Transfer to another Facility (non-emergent condition) []  - 0 Routine Hospital Admission (non-emergent condition) []  - 0 New Admissions / Biomedical engineer / Ordering NPWT, Apligraf, etc. []  - 0 Emergency Hospital Admission (emergent condition) PROCESS - Special Needs []  - Pediatric / Minor Patient Management 0 []  - 0 Isolation Patient Management []  - 0 Hearing / Language / Visual special needs []  - 0 Assessment of Community assistance (transportation, D/C planning, etc.) []  - 0 Additional assistance / Altered mentation []  - 0 Support Surface(s) Assessment (bed, cushion, seat, etc.) INTERVENTIONS - Miscellaneous []  - External ear exam 0 []  - 0 Patient Transfer (multiple staff / Civil Service fast streamer / Similar devices) []  - 0 Simple Staple / Suture removal (25 or less) []  - 0 Complex Staple / Suture removal (26 or more) []  - 0 Hypo/Hyperglycemic Management (do not check if billed separately) []  - 0 Ankle / Brachial Index (ABI) - do not check if billed separately Has the patient been seen at the hospital within the last three years: Yes Total Score: 0 Level Of Care: ____ [name] ([id number]) Electronic  Signature(s) Signed: 12/24/2021 4:39:16 PM By: Levora Dredge Entered By: Levora Dredge on 12/24/2021 13:56:41 [name] ([id number]) -------------------------------------------------------------------------------- Encounter Discharge Information  Details Patient Name: [name], [name] Date of Service: 12/24/2021 1:15 PM Medical Record Number: [id number] Patient Account Number: [id number] Date of Birth/Sex: 11-10-1961 (60 y.o. M) Treating RN: Levora Dredge Primary Care Dimas Scheck: Halina Maidens Other Clinician: Referring Zlaty Alexa: Halina Maidens Treating Rudy Luhmann/Extender: Tito Dine in Treatment: 73 Encounter Discharge Information Items Post Procedure Vitals Discharge Condition: Stable Temperature (F): 98.5 Ambulatory Status: Ambulatory Pulse (bpm): 69 Discharge Destination: Home Respiratory Rate (breaths/min): 18 Transportation: Private Auto Blood Pressure (mmHg): 133/81 Accompanied By: wife Schedule Follow-up Appointment: Yes Clinical Summary of Care: Electronic Signature(s) Signed: 12/24/2021 4:39:16 PM By: Levora Dredge Entered By: Levora Dredge on 12/24/2021 13:58:13 [name] ([id number]) -------------------------------------------------------------------------------- Lower Extremity Assessment Details Patient Name: [name] Date of Service: 12/24/2021 1:15 PM Medical Record Number: [id number] Patient Account Number: [id number] Date of Birth/Sex: 1961-04-17 (60 y.o. M) Treating RN: Levora Dredge Primary Care Krishna Dancel: Halina Maidens Other Clinician: Referring Kaizlee Carlino: Halina Maidens Treating Vilma Will/Extender: Ricard Dillon Weeks in Treatment: 27 Vascular Assessment Pulses: Dorsalis Pedis Palpable: [Right:Yes] Electronic Signature(s) Signed: 12/24/2021 4:39:16 PM By: Levora Dredge Entered By: Levora Dredge on 12/24/2021 13:38:19 [name]  ([id number]) -------------------------------------------------------------------------------- Multi Wound Chart Details Patient Name: [name] Date of Service: 12/24/2021 1:15 PM Medical Record Number: [id number] Patient Account Number: [id number] Date of Birth/Sex: March 25, 1961 (60 y.o. M) Treating RN: Levora Dredge Primary Care Laparis Durrett: Halina Maidens Other Clinician: Referring Prentiss Hammett: Halina Maidens Treating Nyleah Mcginnis/Extender: Tito Dine in Treatment: 27 Vital Signs Height(in): 66 Pulse(bpm): 69 Weight(lbs): 161 Blood Pressure(mmHg): 133/81 Body Mass Index(BMI): 26 Temperature(F): 98.5 Respiratory Rate(breaths/min): 18 Photos: [N/A:N/A] Wound Location: Right, Lateral Ankle N/A N/A Wounding Event: Trauma N/A N/A Primary Etiology: Trauma, Other N/A N/A Comorbid History: Hypertension N/A N/A Date Acquired: 05/09/2021 N/A N/A Weeks of Treatment: 27 N/A N/A Wound Status: Open N/A N/A Measurements L x W x D (cm) 1.3x1.5x0.2 N/A N/A Area (cm) : 1.532 N/A N/A Volume (cm) : 0.306 N/A N/A % Reduction in Area: -95.20% N/A N/A % Reduction in Volume: 22.10% N/A N/A Classification: Full Thickness Without Exposed N/A N/A Support Structures Exudate Amount: None Present N/A N/A Granulation Amount: None Present (0%) N/A N/A Necrotic Amount: Large (67-100%) N/A N/A Necrotic Tissue: Eschar N/A N/A Exposed Structures: Fascia: No N/A N/A Fat Layer (Subcutaneous Tissue): No Tendon: No Muscle: No Joint: No Bone: No Epithelialization: None N/A N/A Treatment Notes Electronic Signature(s) Signed: 12/24/2021 4:39:16 PM By: Levora Dredge Entered By: Levora Dredge on 12/24/2021 13:44:09 [name] ([id number]) -------------------------------------------------------------------------------- [place] Details Patient Name: [name] Date of Service: 12/24/2021 1:15 PM Medical Record Number: [id number] Patient Account  Number: [id number] Date of Birth/Sex: 1960-12-30 (60 y.o. M) Treating RN: Levora Dredge Primary Care Eddrick Dilone: Halina Maidens Other Clinician: Referring Henya Aguallo: Halina Maidens Treating Lenny Bouchillon/Extender: Tito Dine in Treatment: 27 Active Inactive Wound/Skin Impairment Nursing Diagnoses: Knowledge deficit related to ulceration/compromised skin integrity Goals: Patient/caregiver will verbalize understanding of skin care regimen Date Initiated: 06/18/2021 Target Resolution Date: 09/18/2021 Goal Status: Active Ulcer/skin breakdown will have a volume reduction of 30% by week 4 Date Initiated: 06/18/2021 Date Inactivated: 07/16/2021 Target Resolution Date: 07/18/2021 Goal Status: Unmet Unmet Reason: comorbities Ulcer/skin breakdown will have a volume reduction of 50% by week 8 Date Initiated: 06/18/2021 Date Inactivated: 08/20/2021 Target Resolution Date: 08/18/2021 Goal Status: Unmet Unmet Reason: comorbites Ulcer/skin breakdown will have a volume reduction of 80% by week 12 Date Initiated: 06/18/2021 Date Inactivated: 10/01/2021 Target Resolution Date: 09/18/2021 Goal Status: Met Ulcer/skin breakdown will  heal within 14 weeks Date Initiated: 06/18/2021 Target Resolution Date: 10/18/2021 Goal Status: Active Interventions: Assess patient/caregiver ability to obtain necessary supplies Assess patient/caregiver ability to perform ulcer/skin care regimen upon admission and as needed Assess ulceration(s) every visit Notes: Electronic Signature(s) Signed: 12/24/2021 4:39:16 PM By: Levora Dredge Entered By: Levora Dredge on 12/24/2021 13:43:58 [name] ([id number]) -------------------------------------------------------------------------------- Pain Assessment Details Patient Name: [name] Date of Service: 12/24/2021 1:15 PM Medical Record Number: [id number] Patient Account Number: [id number] Date of Birth/Sex: Jan 25, 1961 (60 y.o. M) Treating RN:  Levora Dredge Primary Care Charl Wellen: Halina Maidens Other Clinician: Referring Sheleen Conchas: Halina Maidens Treating Mercede Rollo/Extender: Tito Dine in Treatment: 27 Active Problems Location of Pain Severity and Description of Pain Patient Has Paino No Site Locations Rate the pain. Current Pain Level: 0 Pain Management and Medication Current Pain Management: Electronic Signature(s) Signed: 12/24/2021 4:39:16 PM By: Levora Dredge Entered By: Levora Dredge on 12/24/2021 13:32:38 [name] ([id number]) -------------------------------------------------------------------------------- Patient/Caregiver Education Details Patient Name: [name] Date of Service: 12/24/2021 1:15 PM Medical Record Number: [id number] Patient Account Number: [id number] Date of Birth/Gender: Sep 14, 1961 (60 y.o. M) Treating RN: Levora Dredge Primary Care Physician: Halina Maidens Other Clinician: Referring Physician: Halina Maidens Treating Physician/Extender: Tito Dine in Treatment: 9 Education Assessment Education Provided To: Patient and Caregiver Education Topics Provided Wound/Skin Impairment: Handouts: Caring for Your Ulcer Methods: Explain/Verbal Responses: State content correctly Electronic Signature(s) Signed: 12/24/2021 4:39:16 PM By: Levora Dredge Entered By: Levora Dredge on 12/24/2021 13:57:16 [name] ([id number]) -------------------------------------------------------------------------------- Wound Assessment Details Patient Name: [name] Date of Service: 12/24/2021 1:15 PM Medical Record Number: [id number] Patient Account Number: [id number] Date of Birth/Sex: 08/09/1961 (60 y.o. M) Treating RN: Levora Dredge Primary Care Lennox Leikam: Halina Maidens Other Clinician: Referring Faatimah Spielberg: Halina Maidens Treating Oviya Ammar/Extender: Tito Dine in Treatment: 27 Wound Status Wound Number: 1 Primary  Etiology: Trauma, Other Wound Location: Right, Lateral Ankle Wound Status: Open Wounding Event: Trauma Comorbid History: Hypertension Date Acquired: 05/09/2021 Weeks Of Treatment: 27 Clustered Wound: No Photos Wound Measurements Length: (cm) 1.3 Width: (cm) 1.5 Depth: (cm) 0.2 Area: (cm) 1.532 Volume: (cm) 0.306 % Reduction in Area: -95.2% % Reduction in Volume: 22.1% Epithelialization: None Tunneling: No Undermining: No Wound Description Classification: Full Thickness Without Exposed Support Structures Exudate Amount: None Present Foul Odor After Cleansing: No Slough/Fibrino Yes Wound Bed Granulation Amount: None Present (0%) Exposed Structure Necrotic Amount: Large (67-100%) Fascia Exposed: No Necrotic Quality: Eschar Fat Layer (Subcutaneous Tissue) Exposed: No Tendon Exposed: No Muscle Exposed: No Joint Exposed: No Bone Exposed: No Treatment Notes Wound #1 (Ankle) Wound Laterality: Right, Lateral Cleanser Soap and Water Discharge Instruction: Gently cleanse wound with antibacterial soap, rinse and pat dry prior to dressing wounds Peri-Wound Care Topical [name], [name] ([id number]) Primary Dressing Silvercel Small 2x2 (in/in) Discharge Instruction: Apply Silvercel Small 2x2 (in/in) as instructed Secondary Dressing Zetuvit Plus Silicone Border Dressing 4x4 (in/in) Secured With Compression Wrap Compression Stockings Add-Ons Electronic Signature(s) Signed: 12/24/2021 4:39:16 PM By: Levora Dredge Entered By: Levora Dredge on 12/24/2021 13:37:34 [name] ([id number]) -------------------------------------------------------------------------------- Vitals Details Patient Name: [name] Date of Service: 12/24/2021 1:15 PM Medical Record Number: [id number] Patient Account Number: [id number] Date of Birth/Sex: 10-22-1961 (61 y.o. M) Treating RN: Levora Dredge Primary Care Nyna Chilton: Halina Maidens Other Clinician: Referring Daksh Coates:  Halina Maidens Treating Giannah Zavadil/Extender: Tito Dine in Treatment: 27 Vital Signs Time Taken: 13:31 Temperature (F): 98.5 Height (in): 66 Pulse (bpm): 69 Weight (lbs): 161  Respiratory Rate (breaths/min): 18 Body Mass Index (BMI): 26 Blood Pressure (mmHg): 133/81 Reference Range: 80 - 120 mg / dl Electronic Signature(s) Signed: 12/24/2021 4:39:16 PM By: Levora Dredge Entered By: Levora Dredge on 12/24/2021 13:32:17

## 2022-01-07 ENCOUNTER — Encounter: Payer: 59 | Attending: Physician Assistant | Admitting: Physician Assistant

## 2022-01-07 ENCOUNTER — Other Ambulatory Visit: Payer: Self-pay

## 2022-01-07 DIAGNOSIS — I10 Essential (primary) hypertension: Secondary | ICD-10-CM | POA: Insufficient documentation

## 2022-01-07 DIAGNOSIS — L97312 Non-pressure chronic ulcer of right ankle with fat layer exposed: Secondary | ICD-10-CM | POA: Insufficient documentation

## 2022-01-07 NOTE — Progress Notes (Signed)
Ryan, [name] ([id number]) Visit Report for 01/07/2022 Chief Complaint Document Details Patient Name: [name], [name]. Date of Service: 01/07/2022 2:00 PM Medical Record Number: [id number] Patient Account Number: [id number] Date of Birth/Sex: 1961-03-23 (61 y.o. M) Treating RN: Carlene Coria Primary Care Provider: Halina Maidens Other Clinician: Referring Provider: Halina Maidens Treating Provider/Extender: Skipper Cliche in Treatment: 29 Information Obtained from: Patient Chief Complaint Right ankle laceration Electronic Signature(s) Signed: 01/07/2022 2:02:50 PM By: Worthy Keeler PA-C Entered By: Worthy Keeler on 01/07/2022 14:02:49 [name] ([id number]) -------------------------------------------------------------------------------- HPI Details Patient Name: [name] Date of Service: 01/07/2022 2:00 PM Medical Record Number: [id number] Patient Account Number: [id number] Date of Birth/Sex: 08-Mar-1961 (61 y.o. M) Treating RN: Carlene Coria Primary Care Provider: Halina Maidens Other Clinician: Referring Provider: Halina Maidens Treating Provider/Extender: Skipper Cliche in Treatment: 29 History of Present Illness HPI Description: 06/14/2021 upon evaluation today patient appears to be doing somewhat poorly in regard to the wound on his right ankle region. This was an injury that apparently occurred at work according to what he is telling me today. He is seen with his wife in the office at this point. Nonetheless the patient appears to have a circular opening with necrotic tissue in the base of the wound. This does not appear to be doing nearly as good as I would hope. Fortunately there does not appear to be any signs of infection though he does have a significant amount of necrotic tissue this is going to take some time to heal. He also has previously had surgery in this area there is a lot of scar tissue that will again complicate things and extended  healing process. The patient does have hypertension otherwise no major medical problems. 06/25/2021 upon evaluation today patient presents for reevaluation here in our clinic he actually does seem to be doing better in regard to his wound on the right lateral ankle. He has been tolerating the dressing changes without complication. Fortunately I do not see any signs of infection which is great news. 07/02/2021 upon evaluation today patient appears to be doing about the same in regard to his wound. With that being said I do believe that this is cleaner there was a little bit of slough noted we have been using Iodoflex. I was able to see a little bit more granulation tissue starting to try to build up but I think the scar tissue here is going to be the biggest slowing factor with regard to healing. That was discussed with the patient and his wife today. Nonetheless I think we want to switch up things based on what I see at this point. 07/09/2021 upon evaluation today patient appears to be doing about the same in regard to his ankle ulcer. He has been tolerating the dressing changes without complication. Fortunately there does not appear to be any signs of active infection. No fevers, chills, nausea, vomiting, or diarrhea. 07/16/2021 upon evaluation today patient appears to be doing better to some degree in regard to his wound on ankle region. He actually showed some signs of new granulation growth which is actually a good thing. Fortunately there does not appear to be any evidence of active infection and wound did not appear to be too dry upon evaluation today which is also good news. No fevers, chills, nausea, vomiting, or diarrhea. 07/23/2021 upon evaluation today patient appears to be doing a little better in regard to his wound. He has been tolerating the dressing changes without complication. Fortunately  there does not appear to be any signs of active infection which is great news. No fever chills noted we  are still waiting on the results back from the TheraSkin investigation to see whether or not he qualifies for this treatment option. 07/30/2021 upon evaluation today patient appears to be doing a little better again in regard to his wound. He is showing signs of improvement which is great news. I am very pleased with where things stand. I do believe that honestly all things considered he is making good progress but were just not able to do anything as far as advanced modalities to speed this up as insurance has literally devout denied everything including a snap VAC, Apligraf, PuraPly, and TheraSkin. Again I am not really certain what else to do otherwise other than just try to continue along the path we are as far as getting this wound to heal. 08/06/2021 upon evaluation today patient appears to be doing well currently he is of peering to make progress slowly but nonetheless this is at least making progress at this point. There does not appear to be any signs of active infection at this time. No fevers, chills, nausea, vomiting, or diarrhea. With that being said I do think that we are headed in the right direction with what I am seeing currently. I do wish we could do the snap VAC but to be honest I think this is the best we can do with the collagen as it stands currently. 08/13/2021 upon evaluation today patient unfortunately appears to be doing a little bit more poorly in regard to the fact that he is having some pain on the side of his ankle. This does have me a little bit concerned this is on the edge of the wound. There is a very hard area under this that I presume to be more of a calcification although I cannot be certain this is not just a superficial bony protrusion. I do feel it in the base of the wound but again I assumed more calcification due to inflammation from prior surgery but again that may not actually be the case. I do think that we probably need to go forward with an x-ray at this  point to see if there is anything going on that could be a more significant issue here. Depending on the results of the x-ray we may be looking at an MRI as well. 08/17/2021 upon evaluation today patient appears to be doing well with regard to his ankle ulcer. This actually is showing some signs of improvement which is great news and overall very pleased in that regard. Fortunately there does not appear to be any signs of active infection which is great news as well. No fevers, chills, nausea, vomiting, or diarrhea. With that being said the x-ray showed some surgical abnormalities and along the lateral ankle as well there was some mention of an irregularity. With that being said there was nothing specifically to indicate osteomyelitis as a probable diagnosis here. With that being said I did discuss this with the patient and his wife today their opinion is to hold off on an MRI at this point especially since his wound seems to be showing signs of healing. Fortunately there is no evidence of active infection systemically at this point. 09/03/2021 upon evaluation today patient appears to be doing well with regard to his wound. He has been tolerating the dressing changes without complication. Fortunately there does not appear to be any signs of active infection which  is also great news. In general I am very happy with where we stand. 09/17/2021 upon evaluation today patient appears to be doing well currently in regard to his wound. He has been tolerating the dressing changes without complication. Fortunately there does not appear to be any evidence of active infection which is great news. No fevers, chills, nausea, vomiting, or diarrhea. 10/01/2021 upon evaluation today patient appears to be doing well with regard to his ankle ulcer. He has been tolerating the dressing changes without complication. Fortunately there does not appear to be any signs of infection which is great and overall I am extremely pleased  with where we stand today. No fevers, chills, nausea, vomiting, or diarrhea. DEONTRAY, HUNNICUTT ([id number]) 10/22/2021 upon evaluation today patient appears to be doing well with regard to his wound on the ankle region. Fortunately there does not appear to be any signs of active infection at this time. I think that he has made significant improvement since he was last here and this is great news. 11/12/2021 upon evaluation today patient appears to be doing very well in regard to his wound. In fact this appears to be very close to complete resolution which is great news. I think that he has come a very long way in the time that we have been seeing him. His wife is taking great care of this. 12/03/2021 upon evaluation today patient appears to be doing okay in regard to his wound although its not quite as good as I was hoping for. He still has significant issues here with some drainage and there is a small opening towards the ankle region that has me mostly concerned at this point. I discussed this with the patient and his wife today. She helps take care of them. Fortunately there does not appear to be any signs of active infection locally nor systemically at this point. No fevers, chills, nausea, vomiting, or diarrhea. 12/30; patient with a traumatic wound on the right lateral malleolus in the setting of scar tissue from a remote injury and surgery in this area. He has been using silver collagen changing 3 times a week but moistening this only with water/distilled water. He came in with quite a bit of eschar on the wound surface 01/07/2022 upon evaluation today patient appears to be doing well with regard to his wound. In fact this is mostly skin covered there is just a very small sinus tract which actually extends down to the area that appears to be bone on the lateral aspect of the ankle. There really is not much of a space here other than for me to get a small skinny probe it and only goes down  about 3 mm. Nonetheless this is where it seems to be draining from only and this is the 1 area that has been of concern to be all along. Previously we discussed an MRI but the patient and his wife did not want to do that at that time. Currently they are inquiring as to whether or not we could try an antibiotic and see if that would help to get this last little placed to seal up I can definitely give this a shot although I am not certain it is can make the improvement overall that we want to see here or not. Electronic Signature(s) Signed: 01/07/2022 3:05:18 PM By: Worthy Keeler PA-C Entered By: Worthy Keeler on 01/07/2022 15:05:18 [name] ([id number]) -------------------------------------------------------------------------------- Physical Exam Details Patient Name: [name] Date of Service: 01/07/2022  2:00 PM Medical Record Number: [id number] Patient Account Number: [id number] Date of Birth/Sex: 02-11-1961 (60 y.o. M) Treating RN: Carlene Coria Primary Care Provider: Halina Maidens Other Clinician: Referring Provider: Halina Maidens Treating Provider/Extender: Skipper Cliche in Treatment: 67 Constitutional Well-nourished and well-hydrated in no acute distress. Respiratory normal breathing without difficulty. Psychiatric this patient is able to make decisions and demonstrates good insight into disease process. Alert and Oriented x 3. pleasant and cooperative. Notes Upon inspection patient's wound bed actually showed signs of good granulation and epithelization at this point in fact there is just a very small pinpoint area where I can fit a skinny probe in there really does not appear to be anything significant other than this everything else there is new skin covering. Electronic Signature(s) Signed: 01/07/2022 3:05:37 PM By: Worthy Keeler PA-C Entered By: Worthy Keeler on 01/07/2022 15:05:37 [name]  ([id number]) -------------------------------------------------------------------------------- Physician Orders Details Patient Name: [name] Date of Service: 01/07/2022 2:00 PM Medical Record Number: [id number] Patient Account Number: [id number] Date of Birth/Sex: 11-07-1961 (60 y.o. M) Treating RN: Carlene Coria Primary Care Provider: Halina Maidens Other Clinician: Referring Provider: Halina Maidens Treating Provider/Extender: Skipper Cliche in Treatment: 29 Verbal / Phone Orders: No Diagnosis Coding ICD-10 Coding Code Description F02.637C Laceration without foreign body, right ankle, initial encounter L97.312 Non-pressure chronic ulcer of right ankle with fat layer exposed [place] (primary) hypertension Follow-up Appointments o Return Appointment in 2 weeks. Bathing/ Shower/ Hygiene o May shower; gently cleanse wound with antibacterial soap, rinse and pat dry prior to dressing wounds Edema Control - Lymphedema / Segmental Compressive Device / Other o Elevate, Exercise Daily and Avoid Standing for Long Periods of Time. o Elevate legs to the level of the heart and pump ankles as often as possible o Elevate leg(s) parallel to the floor when sitting. Wound Treatment Wound #1 - Ankle Wound Laterality: Right, Lateral Cleanser: Soap and Water 3 x Per Day/30 Days Discharge Instructions: Gently cleanse wound with antibacterial soap, rinse and pat dry prior to dressing wounds Primary Dressing: Silvercel Small 2x2 (in/in) 3 x Per Day/30 Days Discharge Instructions: Apply Silvercel Small 2x2 (in/in) as instructed Secondary Dressing: Zetuvit Plus Silicone Border Dressing 4x4 (in/in) 3 x Per Day/30 Days Patient Medications Allergies: No Known Drug Allergies Notifications Medication Indication Start End doxycycline hyclate 01/07/2022 DOSE 1 - oral 100 mg capsule - 1 capsule oral taken 2 times per day for 21 days Electronic Signature(s) Signed: 01/07/2022 3:09:55  PM By: Worthy Keeler PA-C Previous Signature: 01/07/2022 2:48:44 PM Version By: Carlene Coria RN Entered By: Worthy Keeler on 01/07/2022 15:09:54 [name] ([id number]) -------------------------------------------------------------------------------- Problem List Details Patient Name: [name] Date of Service: 01/07/2022 2:00 PM Medical Record Number: [id number] Patient Account Number: [id number] Date of Birth/Sex: May 07, 1961 (60 y.o. M) Treating RN: Carlene Coria Primary Care Provider: Halina Maidens Other Clinician: Referring Provider: Halina Maidens Treating Provider/Extender: Skipper Cliche in Treatment: 29 Active Problems ICD-10 Encounter Code Description Active Date MDM Diagnosis S91.011A Laceration without foreign body, right ankle, initial encounter 06/18/2021 No Yes L97.312 Non-pressure chronic ulcer of right ankle with fat layer exposed 06/18/2021 No Yes I10 Essential (primary) hypertension 06/18/2021 No Yes Inactive Problems Resolved Problems Electronic Signature(s) Signed: 01/07/2022 2:02:45 PM By: Worthy Keeler PA-C Entered By: Worthy Keeler on 01/07/2022 14:02:45 [name] ([id number]) -------------------------------------------------------------------------------- Progress Note Details Patient Name: [name] Date of Service: 01/07/2022 2:00 PM Medical Record Number: [id number] Patient Account Number: [id number]  Date of Birth/Sex: September 28, 1961 (61 y.o. M) Treating RN: Carlene Coria Primary Care Provider: Halina Maidens Other Clinician: Referring Provider: Halina Maidens Treating Provider/Extender: Skipper Cliche in Treatment: 29 Subjective Chief Complaint Information obtained from Patient Right ankle laceration History of Present Illness (HPI) 06/14/2021 upon evaluation today patient appears to be doing somewhat poorly in regard to the wound on his right ankle region. This was an injury that apparently occurred at work  according to what he is telling me today. He is seen with his wife in the office at this point. Nonetheless the patient appears to have a circular opening with necrotic tissue in the base of the wound. This does not appear to be doing nearly as good as I would hope. Fortunately there does not appear to be any signs of infection though he does have a significant amount of necrotic tissue this is going to take some time to heal. He also has previously had surgery in this area there is a lot of scar tissue that will again complicate things and extended healing process. The patient does have hypertension otherwise no major medical problems. 06/25/2021 upon evaluation today patient presents for reevaluation here in our clinic he actually does seem to be doing better in regard to his wound on the right lateral ankle. He has been tolerating the dressing changes without complication. Fortunately I do not see any signs of infection which is great news. 07/02/2021 upon evaluation today patient appears to be doing about the same in regard to his wound. With that being said I do believe that this is cleaner there was a little bit of slough noted we have been using Iodoflex. I was able to see a little bit more granulation tissue starting to try to build up but I think the scar tissue here is going to be the biggest slowing factor with regard to healing. That was discussed with the patient and his wife today. Nonetheless I think we want to switch up things based on what I see at this point. 07/09/2021 upon evaluation today patient appears to be doing about the same in regard to his ankle ulcer. He has been tolerating the dressing changes without complication. Fortunately there does not appear to be any signs of active infection. No fevers, chills, nausea, vomiting, or diarrhea. 07/16/2021 upon evaluation today patient appears to be doing better to some degree in regard to his wound on ankle region. He actually  showed some signs of new granulation growth which is actually a good thing. Fortunately there does not appear to be any evidence of active infection and wound did not appear to be too dry upon evaluation today which is also good news. No fevers, chills, nausea, vomiting, or diarrhea. 07/23/2021 upon evaluation today patient appears to be doing a little better in regard to his wound. He has been tolerating the dressing changes without complication. Fortunately there does not appear to be any signs of active infection which is great news. No fever chills noted we are still waiting on the results back from the TheraSkin investigation to see whether or not he qualifies for this treatment option. 07/30/2021 upon evaluation today patient appears to be doing a little better again in regard to his wound. He is showing signs of improvement which is great news. I am very pleased with where things stand. I do believe that honestly all things considered he is making good progress but were just not able to do anything as far as advanced modalities to  speed this up as insurance has literally devout denied everything including a snap VAC, Apligraf, PuraPly, and TheraSkin. Again I am not really certain what else to do otherwise other than just try to continue along the path we are as far as getting this wound to heal. 08/06/2021 upon evaluation today patient appears to be doing well currently he is of peering to make progress slowly but nonetheless this is at least making progress at this point. There does not appear to be any signs of active infection at this time. No fevers, chills, nausea, vomiting, or diarrhea. With that being said I do think that we are headed in the right direction with what I am seeing currently. I do wish we could do the snap VAC but to be honest I think this is the best we can do with the collagen as it stands currently. 08/13/2021 upon evaluation today patient unfortunately appears to be doing a  little bit more poorly in regard to the fact that he is having some pain on the side of his ankle. This does have me a little bit concerned this is on the edge of the wound. There is a very hard area under this that I presume to be more of a calcification although I cannot be certain this is not just a superficial bony protrusion. I do feel it in the base of the wound but again I assumed more calcification due to inflammation from prior surgery but again that may not actually be the case. I do think that we probably need to go forward with an x-ray at this point to see if there is anything going on that could be a more significant issue here. Depending on the results of the x-ray we may be looking at an MRI as well. 08/17/2021 upon evaluation today patient appears to be doing well with regard to his ankle ulcer. This actually is showing some signs of improvement which is great news and overall very pleased in that regard. Fortunately there does not appear to be any signs of active infection which is great news as well. No fevers, chills, nausea, vomiting, or diarrhea. With that being said the x-ray showed some surgical abnormalities and along the lateral ankle as well there was some mention of an irregularity. With that being said there was nothing specifically to indicate osteomyelitis as a probable diagnosis here. With that being said I did discuss this with the patient and his wife today their opinion is to hold off on an MRI at this point especially since his wound seems to be showing signs of healing. Fortunately there is no evidence of active infection systemically at this point. 09/03/2021 upon evaluation today patient appears to be doing well with regard to his wound. He has been tolerating the dressing changes without complication. Fortunately there does not appear to be any signs of active infection which is also great news. In general I am very happy with where we stand. 09/17/2021 upon  evaluation today patient appears to be doing well currently in regard to his wound. He has been tolerating the dressing changes without complication. Fortunately there does not appear to be any evidence of active infection which is great news. No fevers, chills, nausea, Nordquist, Ghazi G. ([id number]) vomiting, or diarrhea. 10/01/2021 upon evaluation today patient appears to be doing well with regard to his ankle ulcer. He has been tolerating the dressing changes without complication. Fortunately there does not appear to be any signs of infection which is great  and overall I am extremely pleased with where we stand today. No fevers, chills, nausea, vomiting, or diarrhea. 10/22/2021 upon evaluation today patient appears to be doing well with regard to his wound on the ankle region. Fortunately there does not appear to be any signs of active infection at this time. I think that he has made significant improvement since he was last here and this is great news. 11/12/2021 upon evaluation today patient appears to be doing very well in regard to his wound. In fact this appears to be very close to complete resolution which is great news. I think that he has come a very long way in the time that we have been seeing him. His wife is taking great care of this. 12/03/2021 upon evaluation today patient appears to be doing okay in regard to his wound although its not quite as good as I was hoping for. He still has significant issues here with some drainage and there is a small opening towards the ankle region that has me mostly concerned at this point. I discussed this with the patient and his wife today. She helps take care of them. Fortunately there does not appear to be any signs of active infection locally nor systemically at this point. No fevers, chills, nausea, vomiting, or diarrhea. 12/30; patient with a traumatic wound on the right lateral malleolus in the setting of scar tissue from a remote injury and  surgery in this area. He has been using silver collagen changing 3 times a week but moistening this only with water/distilled water. He came in with quite a bit of eschar on the wound surface 01/07/2022 upon evaluation today patient appears to be doing well with regard to his wound. In fact this is mostly skin covered there is just a very small sinus tract which actually extends down to the area that appears to be bone on the lateral aspect of the ankle. There really is not much of a space here other than for me to get a small skinny probe it and only goes down about 3 mm. Nonetheless this is where it seems to be draining from only and this is the 1 area that has been of concern to be all along. Previously we discussed an MRI but the patient and his wife did not want to do that at that time. Currently they are inquiring as to whether or not we could try an antibiotic and see if that would help to get this last little placed to seal up I can definitely give this a shot although I am not certain it is can make the improvement overall that we want to see here or not. Objective Constitutional Well-nourished and well-hydrated in no acute distress. Vitals Time Taken: 2:05 PM, Height: 66 in, Weight: 161 lbs, BMI: 26, Temperature: 98.1 F, Pulse: 77 bpm, Respiratory Rate: 18 breaths/min, Blood Pressure: 154/98 mmHg. Respiratory normal breathing without difficulty. Psychiatric this patient is able to make decisions and demonstrates good insight into disease process. Alert and Oriented x 3. pleasant and cooperative. General Notes: Upon inspection patient's wound bed actually showed signs of good granulation and epithelization at this point in fact there is just a very small pinpoint area where I can fit a skinny probe in there really does not appear to be anything significant other than this everything else there is new skin covering. Integumentary (Hair, Skin) Wound #1 status is Open. Original cause of  wound was Trauma. The date acquired was: 05/09/2021. The wound has  been in treatment 29 weeks. The wound is located on the Right,Lateral Ankle. The wound measures 1cm length x 0.8cm width x 0.2cm depth; 0.628cm^2 area and 0.126cm^3 volume. There is no tunneling or undermining noted. There is a medium amount of serosanguineous drainage noted. There is medium (34-66%) pink granulation within the wound bed. There is a medium (34-66%) amount of necrotic tissue within the wound bed including Eschar. Assessment Active Problems ICD-10 Laceration without foreign body, right ankle, initial encounter Non-pressure chronic ulcer of right ankle with fat layer exposed Essential (primary) hypertension [name], [name] ([id number]) Plan Follow-up Appointments: Return Appointment in 2 weeks. Bathing/ Shower/ Hygiene: May shower; gently cleanse wound with antibacterial soap, rinse and pat dry prior to dressing wounds Edema Control - Lymphedema / Segmental Compressive Device / Other: Elevate, Exercise Daily and Avoid Standing for Long Periods of Time. Elevate legs to the level of the heart and pump ankles as often as possible Elevate leg(s) parallel to the floor when sitting. The following medication(s) was prescribed: doxycycline hyclate oral 100 mg capsule 1 1 capsule oral taken 2 times per day for 21 days starting 01/07/2022 WOUND #1: - Ankle Wound Laterality: Right, Lateral Cleanser: Soap and Water 3 x Per Day/30 Days Discharge Instructions: Gently cleanse wound with antibacterial soap, rinse and pat dry prior to dressing wounds Primary Dressing: Silvercel Small 2x2 (in/in) 3 x Per Day/30 Days Discharge Instructions: Apply Silvercel Small 2x2 (in/in) as instructed Secondary Dressing: Zetuvit Plus Silicone Border Dressing 4x4 (in/in) 3 x Per Day/30 Days 1. Based on what I am seeing currently I Georgina Peer go ahead and initiate treatment with a antibiotic for the next 3 weeks see if we get this to clear. There  could be some evidence of a bone infection but again on x-ray back in August this was not showing to be the case and again at that time they did not want to proceed with MRI. If that changes and things do not get better with this antibiotics and I think we probably need to go forward with the MRI. 2. I would recommend we continue with the silver alginate dressing to cover along with a Zetuvit border foam dressing. We will see patient back for reevaluation in 1 week here in the clinic. If anything worsens or changes patient will contact our office for additional recommendations. Electronic Signature(s) Signed: 01/07/2022 3:10:08 PM By: Worthy Keeler PA-C Previous Signature: 01/07/2022 3:06:29 PM Version By: Worthy Keeler PA-C Entered By: Worthy Keeler on 01/07/2022 15:10:08 [name] ([id number]) -------------------------------------------------------------------------------- SuperBill Details Patient Name: [name] Date of Service: 01/07/2022 Medical Record Number: [id number] Patient Account Number: [id number] Date of Birth/Sex: [birth year] (61 y.o. M) Treating RN: Carlene Coria Primary Care Provider: Halina Maidens Other Clinician: Referring Provider: Halina Maidens Treating Provider/Extender: Skipper Cliche in Treatment: 29 Diagnosis Coding ICD-10 Codes Code Description S91.011A Laceration without foreign body, right ankle, initial encounter L97.312 Non-pressure chronic ulcer of right ankle with fat layer exposed [place] (primary) hypertension Facility Procedures CPT4 Code: 53664403 Description: 99213 - WOUND CARE VISIT-LEV 3 EST PT Modifier: Quantity: 1 Physician Procedures CPT4 Code: 4742595 Description: 63875 - WC PHYS LEVEL 4 - EST PT Modifier: Quantity: 1 CPT4 Code: Description: ICD-10 Diagnosis Description I43.329J Laceration without foreign body, right ankle, initial encounter L97.312 Non-pressure chronic ulcer of right ankle with fat layer  exposed I10 Essential (primary) hypertension Modifier: Quantity: Electronic Signature(s) Signed: 01/07/2022 4:21:50 PM By: Carlene Coria RN Previous Signature: 01/07/2022 3:11:19 PM Version By:  Melburn Hake, Kashlynn Kundert PA-C Entered By: Carlene Coria on 01/07/2022 16:21:49

## 2022-01-07 NOTE — Progress Notes (Addendum)
BRODRIC, SCHAUER ([id number]) Visit Report for 01/07/2022 Arrival Information Details Patient Name: [name], [name] Date of Service: 01/07/2022 2:00 PM Medical Record Number: [id number] Patient Account Number: [id number] Date of Birth/Sex: 15-Dec-1961 (61 y.o. M) Treating RN: Carlene Coria Primary Care Marlaine Arey: Halina Maidens Other Clinician: Referring Ramal Eckhardt: Halina Maidens Treating Kavari Parrillo/Extender: Skipper Cliche in Treatment: 29 Visit Information History Since Last Visit All ordered tests and consults were completed: No Patient Arrived: Ambulatory Added or deleted any medications: No Arrival Time: 14:02 Any new allergies or adverse reactions: No Accompanied By: daughter Had a fall or experienced change in No Transfer Assistance: None activities of daily living that may affect Patient Identification Verified: Yes risk of falls: Secondary Verification Process Completed: Yes Signs or symptoms of abuse/neglect since last visito No Patient Requires Transmission-Based Precautions: No Hospitalized since last visit: No Patient Has Alerts: Yes Implantable device outside of the clinic excluding No Patient Alerts: NOT diabetic cellular tissue based products placed in the center since last visit: Has Dressing in Place as Prescribed: Yes Pain Present Now: No Electronic Signature(s) Signed: 01/07/2022 4:26:12 PM By: Carlene Coria RN Entered By: Carlene Coria on 01/07/2022 14:05:39 [name] ([id number]) -------------------------------------------------------------------------------- Clinic Level of Care Assessment Details Patient Name: [name] Date of Service: 01/07/2022 2:00 PM Medical Record Number: [id number] Patient Account Number: [id number] Date of Birth/Sex: Apr 02, 1961 (61 y.o. M) Treating RN: Carlene Coria Primary Care Kalyb Pemble: Halina Maidens Other Clinician: Referring Jaylin Benzel: Halina Maidens Treating Janely Gullickson/Extender: Skipper Cliche in  Treatment: 29 Clinic Level of Care Assessment Items TOOL 4 Quantity Score X - Use when only an EandM is performed on FOLLOW-UP visit 1 0 ASSESSMENTS - Nursing Assessment / Reassessment X - Reassessment of Co-morbidities (includes updates in patient status) 1 10 X- 1 5 Reassessment of Adherence to Treatment Plan ASSESSMENTS - Wound and Skin Assessment / Reassessment X - Simple Wound Assessment / Reassessment - one wound 1 5 []  - 0 Complex Wound Assessment / Reassessment - multiple wounds []  - 0 Dermatologic / Skin Assessment (not related to wound area) ASSESSMENTS - Focused Assessment []  - Circumferential Edema Measurements - multi extremities 0 []  - 0 Nutritional Assessment / Counseling / Intervention []  - 0 Lower Extremity Assessment (monofilament, tuning fork, pulses) []  - 0 Peripheral Arterial Disease Assessment (using hand held doppler) ASSESSMENTS - Ostomy and/or Continence Assessment and Care []  - Incontinence Assessment and Management 0 []  - 0 Ostomy Care Assessment and Management (repouching, etc.) PROCESS - Coordination of Care X - Simple Patient / Family Education for ongoing care 1 15 []  - 0 Complex (extensive) Patient / Family Education for ongoing care []  - 0 Staff obtains Programmer, systems, Records, Test Results / Process Orders []  - 0 Staff telephones HHA, Nursing Homes / Clarify orders / etc []  - 0 Routine Transfer to another Facility (non-emergent condition) []  - 0 Routine Hospital Admission (non-emergent condition) []  - 0 New Admissions / Biomedical engineer / Ordering NPWT, Apligraf, etc. []  - 0 Emergency Hospital Admission (emergent condition) X- 1 10 Simple Discharge Coordination []  - 0 Complex (extensive) Discharge Coordination PROCESS - Special Needs []  - Pediatric / Minor Patient Management 0 []  - 0 Isolation Patient Management []  - 0 Hearing / Language / Visual special needs []  - 0 Assessment of Community assistance (transportation, D/C  planning, etc.) []  - 0 Additional assistance / Altered mentation []  - 0 Support Surface(s) Assessment (bed, cushion, seat, etc.) INTERVENTIONS - Wound Cleansing / Measurement BELLAMY, JUDSON. ([id number]) X- 1 5  Simple Wound Cleansing - one wound []  - 0 Complex Wound Cleansing - multiple wounds X- 1 5 Wound Imaging (photographs - any number of wounds) []  - 0 Wound Tracing (instead of photographs) X- 1 5 Simple Wound Measurement - one wound []  - 0 Complex Wound Measurement - multiple wounds INTERVENTIONS - Wound Dressings X - Small Wound Dressing one or multiple wounds 1 10 []  - 0 Medium Wound Dressing one or multiple wounds []  - 0 Large Wound Dressing one or multiple wounds X- 1 5 Application of Medications - topical []  - 0 Application of Medications - injection INTERVENTIONS - Miscellaneous []  - External ear exam 0 []  - 0 Specimen Collection (cultures, biopsies, blood, body fluids, etc.) []  - 0 Specimen(s) / Culture(s) sent or taken to Lab for analysis []  - 0 Patient Transfer (multiple staff / Civil Service fast streamer / Similar devices) []  - 0 Simple Staple / Suture removal (25 or less) []  - 0 Complex Staple / Suture removal (26 or more) []  - 0 Hypo / Hyperglycemic Management (close monitor of Blood Glucose) []  - 0 Ankle / Brachial Index (ABI) - do not check if billed separately X- 1 5 Vital Signs Has the patient been seen at the hospital within the last three years: Yes Total Score: 80 Level Of Care: New/Established - Level 3 Electronic Signature(s) Signed: 01/07/2022 4:26:12 PM By: Carlene Coria RN Entered By: Carlene Coria on 01/07/2022 16:21:39 [name] ([id number]) -------------------------------------------------------------------------------- Encounter Discharge Information Details Patient Name: [name] Date of Service: 01/07/2022 2:00 PM Medical Record Number: [id number] Patient Account Number: [id number] Date of Birth/Sex: [birth year] (61 y.o.  M) Treating RN: Carlene Coria Primary Care Liyana Suniga: Halina Maidens Other Clinician: Referring Tikia Skilton: Halina Maidens Treating Maitri Schnoebelen/Extender: Skipper Cliche in Treatment: 29 Encounter Discharge Information Items Discharge Condition: Stable Ambulatory Status: Ambulatory Discharge Destination: Home Transportation: Private Auto Accompanied By: self Schedule Follow-up Appointment: Yes Clinical Summary of Care: Patient Declined Electronic Signature(s) Signed: 01/07/2022 3:20:33 PM By: Carlene Coria RN Entered By: Carlene Coria on 01/07/2022 15:20:33 [name] ([id number]) -------------------------------------------------------------------------------- Lower Extremity Assessment Details Patient Name: [name] Date of Service: 01/07/2022 2:00 PM Medical Record Number: [id number] Patient Account Number: [id number] Date of Birth/Sex: July 19, 1961 (60 y.o. M) Treating RN: Carlene Coria Primary Care Hailyn Zarr: Halina Maidens Other Clinician: Referring Dakisha Schoof: Halina Maidens Treating Sae Handrich/Extender: Jeri Cos Weeks in Treatment: 29 Vascular Assessment Pulses: Dorsalis Pedis Palpable: [Right:Yes] Electronic Signature(s) Signed: 01/07/2022 2:47:56 PM By: Carlene Coria RN Entered By: Carlene Coria on 01/07/2022 14:47:55 [name] ([id number]) -------------------------------------------------------------------------------- Multi Wound Chart Details Patient Name: [name] Date of Service: 01/07/2022 2:00 PM Medical Record Number: [id number] Patient Account Number: [id number] Date of Birth/Sex: 06-Mar-1961 (60 y.o. M) Treating RN: Carlene Coria Primary Care Daleigh Pollinger: Halina Maidens Other Clinician: Referring Donterrius Santucci: Halina Maidens Treating Denecia Brunette/Extender: Skipper Cliche in Treatment: 29 Vital Signs Height(in): 66 Pulse(bpm): 78 Weight(lbs): 161 Blood Pressure(mmHg): 154/98 Body Mass Index(BMI): 26 Temperature(F): 98.1 Respiratory  Rate(breaths/min): 18 Photos: [N/A:N/A] Wound Location: Right, Lateral Ankle N/A N/A Wounding Event: Trauma N/A N/A Primary Etiology: Trauma, Other N/A N/A Comorbid History: Hypertension N/A N/A Date Acquired: 05/09/2021 N/A N/A Weeks of Treatment: 29 N/A N/A Wound Status: Open N/A N/A Measurements L x W x D (cm) 1x0.8x0.2 N/A N/A Area (cm) : 0.628 N/A N/A Volume (cm) : 0.126 N/A N/A % Reduction in Area: 20.00% N/A N/A % Reduction in Volume: 67.90% N/A N/A Classification: Full Thickness Without Exposed N/A N/A Support Structures Exudate Amount: Medium N/A  N/A Exudate Type: Serosanguineous N/A N/A Exudate Color: red, brown N/A N/A Granulation Amount: Medium (34-66%) N/A N/A Granulation Quality: Pink N/A N/A Necrotic Amount: Medium (34-66%) N/A N/A Necrotic Tissue: Eschar N/A N/A Exposed Structures: Fascia: No N/A N/A Fat Layer (Subcutaneous Tissue): No Tendon: No Muscle: No Joint: No Bone: No Epithelialization: None N/A N/A Treatment Notes Electronic Signature(s) Signed: 01/07/2022 2:48:15 PM By: Carlene Coria RN Entered By: Carlene Coria on 01/07/2022 14:48:15 [name] ([id number]) -------------------------------------------------------------------------------- Multi-Disciplinary Care Plan Details Patient Name: [name] Date of Service: 01/07/2022 2:00 PM Medical Record Number: [id number] Patient Account Number: [id number] Date of Birth/Sex: 04-24-1961 (60 y.o. M) Treating RN: Carlene Coria Primary Care Delvis Kau: Halina Maidens Other Clinician: Referring Lilyahna Sirmon: Halina Maidens Treating Eman Rynders/Extender: Skipper Cliche in Treatment: 29 Active Inactive Wound/Skin Impairment Nursing Diagnoses: Knowledge deficit related to ulceration/compromised skin integrity Goals: Patient/caregiver will verbalize understanding of skin care regimen Date Initiated: 06/18/2021 Target Resolution Date: 09/18/2021 Goal Status: Active Ulcer/skin breakdown will have  a volume reduction of 30% by week 4 Date Initiated: 06/18/2021 Date Inactivated: 07/16/2021 Target Resolution Date: 07/18/2021 Goal Status: Unmet Unmet Reason: comorbities Ulcer/skin breakdown will have a volume reduction of 50% by week 8 Date Initiated: 06/18/2021 Date Inactivated: 08/20/2021 Target Resolution Date: 08/18/2021 Goal Status: Unmet Unmet Reason: comorbites Ulcer/skin breakdown will have a volume reduction of 80% by week 12 Date Initiated: 06/18/2021 Date Inactivated: 10/01/2021 Target Resolution Date: 09/18/2021 Goal Status: Met Ulcer/skin breakdown will heal within 14 weeks Date Initiated: 06/18/2021 Target Resolution Date: 10/18/2021 Goal Status: Active Interventions: Assess patient/caregiver ability to obtain necessary supplies Assess patient/caregiver ability to perform ulcer/skin care regimen upon admission and as needed Assess ulceration(s) every visit Notes: Electronic Signature(s) Signed: 01/07/2022 2:48:04 PM By: Carlene Coria RN Entered By: Carlene Coria on 01/07/2022 14:48:03 [name] ([id number]) -------------------------------------------------------------------------------- Pain Assessment Details Patient Name: [name] Date of Service: 01/07/2022 2:00 PM Medical Record Number: [id number] Patient Account Number: [id number] Date of Birth/Sex: January 28, 1961 (60 y.o. M) Treating RN: Carlene Coria Primary Care Nyisha Clippard: Halina Maidens Other Clinician: Referring Lamont Tant: Halina Maidens Treating Lurleen Soltero/Extender: Skipper Cliche in Treatment: 29 Active Problems Location of Pain Severity and Description of Pain Patient Has Paino No Site Locations Pain Management and Medication Current Pain Management: Electronic Signature(s) Signed: 01/07/2022 4:26:12 PM By: Carlene Coria RN Entered By: Carlene Coria on 01/07/2022 14:06:05 [name]  ([id number]) -------------------------------------------------------------------------------- Patient/Caregiver Education Details Patient Name: [name] Date of Service: 01/07/2022 2:00 PM Medical Record Number: [id number] Patient Account Number: [id number] Date of Birth/Gender: [birth year] (61 y.o. M) Treating RN: Carlene Coria Primary Care Physician: Halina Maidens Other Clinician: Referring Physician: Halina Maidens Treating Physician/Extender: Skipper Cliche in Treatment: 29 Education Assessment Education Provided To: Patient Education Topics Provided Wound/Skin Impairment: Methods: Explain/Verbal Responses: State content correctly Electronic Signature(s) Signed: 01/07/2022 4:26:12 PM By: Carlene Coria RN Entered By: Carlene Coria on 01/07/2022 16:22:27 [name] ([id number]) -------------------------------------------------------------------------------- Wound Assessment Details Patient Name: [name] Date of Service: 01/07/2022 2:00 PM Medical Record Number: [id number] Patient Account Number: [id number] Date of Birth/Sex: 06/01/1961 (60 y.o. M) Treating RN: Carlene Coria Primary Care Bianca Raneri: Halina Maidens Other Clinician: Referring Solace Manwarren: Halina Maidens Treating Tommie Bohlken/Extender: Jeri Cos Weeks in Treatment: 29 Wound Status Wound Number: 1 Primary Etiology: Trauma, Other Wound Location: Right, Lateral Ankle Wound Status: Open Wounding Event: Trauma Comorbid History: Hypertension Date Acquired: 05/09/2021 Weeks Of Treatment: 29 Clustered Wound: No Photos Wound Measurements Length: (cm) 1 Width: (cm) 0.8 Depth: (cm) 0.2 Area: (  cm) 0.628 Volume: (cm) 0.126 % Reduction in Area: 20% % Reduction in Volume: 67.9% Epithelialization: None Tunneling: No Undermining: No Wound Description Classification: Full Thickness Without Exposed Support Structures Exudate Amount: Medium Exudate Type: Serosanguineous Exudate Color: red,  brown Foul Odor After Cleansing: No Slough/Fibrino Yes Wound Bed Granulation Amount: Medium (34-66%) Exposed Structure Granulation Quality: Pink Fascia Exposed: No Necrotic Amount: Medium (34-66%) Fat Layer (Subcutaneous Tissue) Exposed: No Necrotic Quality: Eschar Tendon Exposed: No Muscle Exposed: No Joint Exposed: No Bone Exposed: No Treatment Notes Wound #1 (Ankle) Wound Laterality: Right, Lateral Cleanser Soap and Water Discharge Instruction: Gently cleanse wound with antibacterial soap, rinse and pat dry prior to dressing wounds [place], Labaron G. ([id number]) Topical Primary Dressing Silvercel Small 2x2 (in/in) Discharge Instruction: Apply Silvercel Small 2x2 (in/in) as instructed Secondary Dressing Zetuvit Plus Silicone Border Dressing 4x4 (in/in) Secured With Compression Wrap Compression Stockings Add-Ons Electronic Signature(s) Signed: 01/07/2022 4:26:12 PM By: Carlene Coria RN Entered By: Carlene Coria on 01/07/2022 14:12:11 [name] ([id number]) -------------------------------------------------------------------------------- Vitals Details Patient Name: [name] Date of Service: 01/07/2022 2:00 PM Medical Record Number: [id number] Patient Account Number: [id number] Date of Birth/Sex: [birth year] (60 y.o. M) Treating RN: Carlene Coria Primary Care Allanah Mcfarland: Halina Maidens Other Clinician: Referring Haeli Gerlich: Halina Maidens Treating Lelia Jons/Extender: Skipper Cliche in Treatment: 29 Vital Signs Time Taken: 14:05 Temperature (F): 98.1 Height (in): 66 Pulse (bpm): 77 Weight (lbs): 161 Respiratory Rate (breaths/min): 18 Body Mass Index (BMI): 26 Blood Pressure (mmHg): 154/98 Reference Range: 80 - 120 mg / dl Electronic Signature(s) Signed: 01/07/2022 4:26:12 PM By: Carlene Coria RN Entered By: Carlene Coria on 01/07/2022 14:05:57

## 2022-01-28 ENCOUNTER — Encounter: Payer: 59 | Attending: Physician Assistant | Admitting: Physician Assistant

## 2022-01-28 ENCOUNTER — Other Ambulatory Visit: Payer: Self-pay

## 2022-01-28 DIAGNOSIS — S91011A Laceration without foreign body, right ankle, initial encounter: Secondary | ICD-10-CM | POA: Insufficient documentation

## 2022-01-28 DIAGNOSIS — L97312 Non-pressure chronic ulcer of right ankle with fat layer exposed: Secondary | ICD-10-CM | POA: Diagnosis not present

## 2022-01-28 DIAGNOSIS — I10 Essential (primary) hypertension: Secondary | ICD-10-CM | POA: Diagnosis not present

## 2022-01-28 DIAGNOSIS — X58XXXA Exposure to other specified factors, initial encounter: Secondary | ICD-10-CM | POA: Diagnosis not present

## 2022-01-28 NOTE — Progress Notes (Addendum)
KRESTON, AHRENDT ([id number]) Visit Report for 01/28/2022 Chief Complaint Document Details Patient Name: [name], [name]. Date of Service: 01/28/2022 2:45 PM Medical Record Number: [id number] Patient Account Number: [id number] Date of Birth/Sex: [birth year] (61 y.o. M) Treating RN: Cornell Barman Primary Care Provider: Halina Maidens Other Clinician: Referring Provider: Halina Maidens Treating Provider/Extender: Skipper Cliche in Treatment: 15 Information Obtained from: Patient Chief Complaint Right ankle laceration Electronic Signature(s) Signed: 01/28/2022 2:56:22 PM By: Worthy Keeler PA-C Entered By: Worthy Keeler on 01/28/2022 14:56:22 [name] ([id number]) -------------------------------------------------------------------------------- HPI Details Patient Name: [name] Date of Service: 01/28/2022 2:45 PM Medical Record Number: [id number] Patient Account Number: [id number] Date of Birth/Sex: 1961-02-12 (61 y.o. M) Treating RN: Cornell Barman Primary Care Provider: Halina Maidens Other Clinician: Referring Provider: Halina Maidens Treating Provider/Extender: Skipper Cliche in Treatment: 32 History of Present Illness HPI Description: 06/14/2021 upon evaluation today patient appears to be doing somewhat poorly in regard to the wound on his right ankle region. This was an injury that apparently occurred at work according to what he is telling me today. He is seen with his wife in the office at this point. Nonetheless the patient appears to have a circular opening with necrotic tissue in the base of the wound. This does not appear to be doing nearly as good as I would hope. Fortunately there does not appear to be any signs of infection though he does have a significant amount of necrotic tissue this is going to take some time to heal. He also has previously had surgery in this area there is a lot of scar tissue that will again complicate things and extended healing  process. The patient does have hypertension otherwise no major medical problems. 06/25/2021 upon evaluation today patient presents for reevaluation here in our clinic he actually does seem to be doing better in regard to his wound on the right lateral ankle. He has been tolerating the dressing changes without complication. Fortunately I do not see any signs of infection which is great news. 07/02/2021 upon evaluation today patient appears to be doing about the same in regard to his wound. With that being said I do believe that this is cleaner there was a little bit of slough noted we have been using Iodoflex. I was able to see a little bit more granulation tissue starting to try to build up but I think the scar tissue here is going to be the biggest slowing factor with regard to healing. That was discussed with the patient and his wife today. Nonetheless I think we want to switch up things based on what I see at this point. 07/09/2021 upon evaluation today patient appears to be doing about the same in regard to his ankle ulcer. He has been tolerating the dressing changes without complication. Fortunately there does not appear to be any signs of active infection. No fevers, chills, nausea, vomiting, or diarrhea. 07/16/2021 upon evaluation today patient appears to be doing better to some degree in regard to his wound on ankle region. He actually showed some signs of new granulation growth which is actually a good thing. Fortunately there does not appear to be any evidence of active infection and wound did not appear to be too dry upon evaluation today which is also good news. No fevers, chills, nausea, vomiting, or diarrhea. 07/23/2021 upon evaluation today patient appears to be doing a little better in regard to his wound. He has been tolerating the dressing changes without complication. Fortunately  there does not appear to be any signs of active infection which is great news. No fever chills noted we are  still waiting on the results back from the TheraSkin investigation to see whether or not he qualifies for this treatment option. 07/30/2021 upon evaluation today patient appears to be doing a little better again in regard to his wound. He is showing signs of improvement which is great news. I am very pleased with where things stand. I do believe that honestly all things considered he is making good progress but were just not able to do anything as far as advanced modalities to speed this up as insurance has literally devout denied everything including a snap VAC, Apligraf, PuraPly, and TheraSkin. Again I am not really certain what else to do otherwise other than just try to continue along the path we are as far as getting this wound to heal. 08/06/2021 upon evaluation today patient appears to be doing well currently he is of peering to make progress slowly but nonetheless this is at least making progress at this point. There does not appear to be any signs of active infection at this time. No fevers, chills, nausea, vomiting, or diarrhea. With that being said I do think that we are headed in the right direction with what I am seeing currently. I do wish we could do the snap VAC but to be honest I think this is the best we can do with the collagen as it stands currently. 08/13/2021 upon evaluation today patient unfortunately appears to be doing a little bit more poorly in regard to the fact that he is having some pain on the side of his ankle. This does have me a little bit concerned this is on the edge of the wound. There is a very hard area under this that I presume to be more of a calcification although I cannot be certain this is not just a superficial bony protrusion. I do feel it in the base of the wound but again I assumed more calcification due to inflammation from prior surgery but again that may not actually be the case. I do think that we probably need to go forward with an x-ray at this point  to see if there is anything going on that could be a more significant issue here. Depending on the results of the x-ray we may be looking at an MRI as well. 08/17/2021 upon evaluation today patient appears to be doing well with regard to his ankle ulcer. This actually is showing some signs of improvement which is great news and overall very pleased in that regard. Fortunately there does not appear to be any signs of active infection which is great news as well. No fevers, chills, nausea, vomiting, or diarrhea. With that being said the x-ray showed some surgical abnormalities and along the lateral ankle as well there was some mention of an irregularity. With that being said there was nothing specifically to indicate osteomyelitis as a probable diagnosis here. With that being said I did discuss this with the patient and his wife today their opinion is to hold off on an MRI at this point especially since his wound seems to be showing signs of healing. Fortunately there is no evidence of active infection systemically at this point. 09/03/2021 upon evaluation today patient appears to be doing well with regard to his wound. He has been tolerating the dressing changes without complication. Fortunately there does not appear to be any signs of active infection which  is also great news. In general I am very happy with where we stand. 09/17/2021 upon evaluation today patient appears to be doing well currently in regard to his wound. He has been tolerating the dressing changes without complication. Fortunately there does not appear to be any evidence of active infection which is great news. No fevers, chills, nausea, vomiting, or diarrhea. 10/01/2021 upon evaluation today patient appears to be doing well with regard to his ankle ulcer. He has been tolerating the dressing changes without complication. Fortunately there does not appear to be any signs of infection which is great and overall I am extremely pleased with  where we stand today. No fevers, chills, nausea, vomiting, or diarrhea. HENDRIX, YURKOVICH ([id number]) 10/22/2021 upon evaluation today patient appears to be doing well with regard to his wound on the ankle region. Fortunately there does not appear to be any signs of active infection at this time. I think that he has made significant improvement since he was last here and this is great news. 11/12/2021 upon evaluation today patient appears to be doing very well in regard to his wound. In fact this appears to be very close to complete resolution which is great news. I think that he has come a very long way in the time that we have been seeing him. His wife is taking great care of this. 12/03/2021 upon evaluation today patient appears to be doing okay in regard to his wound although its not quite as good as I was hoping for. He still has significant issues here with some drainage and there is a small opening towards the ankle region that has me mostly concerned at this point. I discussed this with the patient and his wife today. She helps take care of them. Fortunately there does not appear to be any signs of active infection locally nor systemically at this point. No fevers, chills, nausea, vomiting, or diarrhea. 12/30; patient with a traumatic wound on the right lateral malleolus in the setting of scar tissue from a remote injury and surgery in this area. He has been using silver collagen changing 3 times a week but moistening this only with water/distilled water. He came in with quite a bit of eschar on the wound surface 01/07/2022 upon evaluation today patient appears to be doing well with regard to his wound. In fact this is mostly skin covered there is just a very small sinus tract which actually extends down to the area that appears to be bone on the lateral aspect of the ankle. There really is not much of a space here other than for me to get a small skinny probe it and only goes down about 3  mm. Nonetheless this is where it seems to be draining from only and this is the 1 area that has been of concern to be all along. Previously we discussed an MRI but the patient and his wife did not want to do that at that time. Currently they are inquiring as to whether or not we could try an antibiotic and see if that would help to get this last little placed to seal up I can definitely give this a shot although I am not certain it is can make the improvement overall that we want to see here or not. 02/14/2022 upon evaluation today patient appears to be doing well with regard to his wound all things considered. This is actually showing signs of good improvement in my opinion I am very pleased in that regard.  I do not see any signs of active infection which is great news. Electronic Signature(s) Signed: 01/28/2022 3:14:14 PM By: Worthy Keeler PA-C Entered By: Worthy Keeler on 01/28/2022 15:14:14 [name] ([id number]) -------------------------------------------------------------------------------- Physical Exam Details Patient Name: [name] Date of Service: 01/28/2022 2:45 PM Medical Record Number: [id number] Patient Account Number: [id number] Date of Birth/Sex: 04/12/1961 (61 y.o. M) Treating RN: Cornell Barman Primary Care Provider: Halina Maidens Other Clinician: Referring Provider: Halina Maidens Treating Provider/Extender: Jeri Cos Weeks in Treatment: 76 Constitutional Well-nourished and well-hydrated in no acute distress. Respiratory normal breathing without difficulty. Psychiatric this patient is able to make decisions and demonstrates good insight into disease process. Alert and Oriented x 3. pleasant and cooperative. Notes Upon inspection patient's wound bed actually showed signs of good improvement which is great news. I am very pleased with where we stand today. I do not see any signs of active infection I do think that this area is almost completely healed. I  think just using a small dab of mupirocin ointment would be an okay thing to do just to try to help keep the area moist and allow it to hopefully heal and I am hoping that this will be done in the matter of just a few weeks completely. Electronic Signature(s) Signed: 01/28/2022 3:16:30 PM By: Worthy Keeler PA-C Entered By: Worthy Keeler on 01/28/2022 15:16:30 [name] ([id number]) -------------------------------------------------------------------------------- Physician Orders Details Patient Name: [name] Date of Service: 01/28/2022 2:45 PM Medical Record Number: [id number] Patient Account Number: [id number] Date of Birth/Sex: Aug 22, 1961 (61 y.o. M) Treating RN: Cornell Barman Primary Care Provider: Halina Maidens Other Clinician: Referring Provider: Halina Maidens Treating Provider/Extender: Skipper Cliche in Treatment: 28 Verbal / Phone Orders: No Diagnosis Coding ICD-10 Coding Code Description Y60.630Z Laceration without foreign body, right ankle, initial encounter L97.312 Non-pressure chronic ulcer of right ankle with fat layer exposed I10 Essential (primary) hypertension Follow-up Appointments o Return Appointment in 3 weeks. - per patient request Bathing/ Shower/ Hygiene o May shower; gently cleanse wound with antibacterial soap, rinse and pat dry prior to dressing wounds Edema Control - Lymphedema / Segmental Compressive Device / Other o Elevate, Exercise Daily and Avoid Standing for Long Periods of Time. o Elevate legs to the level of the heart and pump ankles as often as possible o Elevate leg(s) parallel to the floor when sitting. Additional Orders / Instructions o Other: - Out of work today, return tomorrow. Wound Treatment Wound #1 - Ankle Wound Laterality: Right, Lateral Cleanser: Soap and Water 3 x Per Day/30 Days Discharge Instructions: Gently cleanse wound with antibacterial soap, rinse and pat dry prior to dressing  wounds Topical: Mupirocin Ointment (Generic) 3 x Per Day/30 Days Discharge Instructions: Apply a small dab to the open wound area then cover with alginate per below Primary Dressing: Silvercel Small 2x2 (in/in) 3 x Per Day/30 Days Discharge Instructions: Apply Silvercel Small 2x2 (in/in) as instructed Secondary Dressing: Zetuvit Plus Silicone Border Dressing 4x4 (in/in) 3 x Per Day/30 Days Patient Medications Allergies: No Known Drug Allergies Notifications Medication Indication Start End mupirocin 01/28/2022 DOSE topical 2 % ointment - ointment topical applied in a small dab to the wound opening as directed in the clinic 3 time per week Electronic Signature(s) Signed: 01/28/2022 4:48:07 PM By: Worthy Keeler PA-C Signed: 02/01/2022 9:26:54 AM By: Gretta Cool, BSN, RN, CWS, Kim RN, BSN Previous Signature: 01/28/2022 3:16:17 PM Version By: Worthy Keeler PA-C Previous Signature: 01/28/2022 3:12:50 PM Version By:  Melburn Hake, Railynn Ballo PA-C Entered By: Gretta Cool, BSN, RN, CWS, Kim on 01/28/2022 15:23:01 [name] ([id number]) -------------------------------------------------------------------------------- Problem List Details Patient Name: [name] Date of Service: 01/28/2022 2:45 PM Medical Record Number: [id number] Patient Account Number: [id number] Date of Birth/Sex: 1961-06-24 (61 y.o. M) Treating RN: Cornell Barman Primary Care Provider: Halina Maidens Other Clinician: Referring Provider: Halina Maidens Treating Provider/Extender: Skipper Cliche in Treatment: 32 Active Problems ICD-10 Encounter Code Description Active Date MDM Diagnosis S91.011A Laceration without foreign body, right ankle, initial encounter 06/18/2021 No Yes L97.312 Non-pressure chronic ulcer of right ankle with fat layer exposed 06/18/2021 No Yes I10 Essential (primary) hypertension 06/18/2021 No Yes Inactive Problems Resolved Problems Electronic Signature(s) Signed: 01/28/2022 2:56:17 PM By: Worthy Keeler  PA-C Entered By: Worthy Keeler on 01/28/2022 14:56:17 [name] ([id number]) -------------------------------------------------------------------------------- Progress Note Details Patient Name: [name] Date of Service: 01/28/2022 2:45 PM Medical Record Number: [id number] Patient Account Number: [id number] Date of Birth/Sex: [birth year] (61 y.o. M) Treating RN: Cornell Barman Primary Care Provider: Halina Maidens Other Clinician: Referring Provider: Halina Maidens Treating Provider/Extender: Skipper Cliche in Treatment: 32 Subjective Chief Complaint Information obtained from Patient Right ankle laceration History of Present Illness (HPI) 06/14/2021 upon evaluation today patient appears to be doing somewhat poorly in regard to the wound on his right ankle region. This was an injury that apparently occurred at work according to what he is telling me today. He is seen with his wife in the office at this point. Nonetheless the patient appears to have a circular opening with necrotic tissue in the base of the wound. This does not appear to be doing nearly as good as I would hope. Fortunately there does not appear to be any signs of infection though he does have a significant amount of necrotic tissue this is going to take some time to heal. He also has previously had surgery in this area there is a lot of scar tissue that will again complicate things and extended healing process. The patient does have hypertension otherwise no major medical problems. 06/25/2021 upon evaluation today patient presents for reevaluation here in our clinic he actually does seem to be doing better in regard to his wound on the right lateral ankle. He has been tolerating the dressing changes without complication. Fortunately I do not see any signs of infection which is great news. 07/02/2021 upon evaluation today patient appears to be doing about the same in regard to his wound. With that being said I do  believe that this is cleaner there was a little bit of slough noted we have been using Iodoflex. I was able to see a little bit more granulation tissue starting to try to build up but I think the scar tissue here is going to be the biggest slowing factor with regard to healing. That was discussed with the patient and his wife today. Nonetheless I think we want to switch up things based on what I see at this point. 07/09/2021 upon evaluation today patient appears to be doing about the same in regard to his ankle ulcer. He has been tolerating the dressing changes without complication. Fortunately there does not appear to be any signs of active infection. No fevers, chills, nausea, vomiting, or diarrhea. 07/16/2021 upon evaluation today patient appears to be doing better to some degree in regard to his wound on ankle region. He actually showed some signs of new granulation growth which is actually a good thing. Fortunately there does not  appear to be any evidence of active infection and wound did not appear to be too dry upon evaluation today which is also good news. No fevers, chills, nausea, vomiting, or diarrhea. 07/23/2021 upon evaluation today patient appears to be doing a little better in regard to his wound. He has been tolerating the dressing changes without complication. Fortunately there does not appear to be any signs of active infection which is great news. No fever chills noted we are still waiting on the results back from the TheraSkin investigation to see whether or not he qualifies for this treatment option. 07/30/2021 upon evaluation today patient appears to be doing a little better again in regard to his wound. He is showing signs of improvement which is great news. I am very pleased with where things stand. I do believe that honestly all things considered he is making good progress but were just not able to do anything as far as advanced modalities to speed this up as insurance has  literally devout denied everything including a snap VAC, Apligraf, PuraPly, and TheraSkin. Again I am not really certain what else to do otherwise other than just try to continue along the path we are as far as getting this wound to heal. 08/06/2021 upon evaluation today patient appears to be doing well currently he is of peering to make progress slowly but nonetheless this is at least making progress at this point. There does not appear to be any signs of active infection at this time. No fevers, chills, nausea, vomiting, or diarrhea. With that being said I do think that we are headed in the right direction with what I am seeing currently. I do wish we could do the snap VAC but to be honest I think this is the best we can do with the collagen as it stands currently. 08/13/2021 upon evaluation today patient unfortunately appears to be doing a little bit more poorly in regard to the fact that he is having some pain on the side of his ankle. This does have me a little bit concerned this is on the edge of the wound. There is a very hard area under this that I presume to be more of a calcification although I cannot be certain this is not just a superficial bony protrusion. I do feel it in the base of the wound but again I assumed more calcification due to inflammation from prior surgery but again that may not actually be the case. I do think that we probably need to go forward with an x-ray at this point to see if there is anything going on that could be a more significant issue here. Depending on the results of the x-ray we may be looking at an MRI as well. 08/17/2021 upon evaluation today patient appears to be doing well with regard to his ankle ulcer. This actually is showing some signs of improvement which is great news and overall very pleased in that regard. Fortunately there does not appear to be any signs of active infection which is great news as well. No fevers, chills, nausea, vomiting, or  diarrhea. With that being said the x-ray showed some surgical abnormalities and along the lateral ankle as well there was some mention of an irregularity. With that being said there was nothing specifically to indicate osteomyelitis as a probable diagnosis here. With that being said I did discuss this with the patient and his wife today their opinion is to hold off on an MRI at this point especially  since his wound seems to be showing signs of healing. Fortunately there is no evidence of active infection systemically at this point. 09/03/2021 upon evaluation today patient appears to be doing well with regard to his wound. He has been tolerating the dressing changes without complication. Fortunately there does not appear to be any signs of active infection which is also great news. In general I am very happy with where we stand. 09/17/2021 upon evaluation today patient appears to be doing well currently in regard to his wound. He has been tolerating the dressing changes without complication. Fortunately there does not appear to be any evidence of active infection which is great news. No fevers, chills, nausea, Wichert, Trentan G. ([id number]) vomiting, or diarrhea. 10/01/2021 upon evaluation today patient appears to be doing well with regard to his ankle ulcer. He has been tolerating the dressing changes without complication. Fortunately there does not appear to be any signs of infection which is great and overall I am extremely pleased with where we stand today. No fevers, chills, nausea, vomiting, or diarrhea. 10/22/2021 upon evaluation today patient appears to be doing well with regard to his wound on the ankle region. Fortunately there does not appear to be any signs of active infection at this time. I think that he has made significant improvement since he was last here and this is great news. 11/12/2021 upon evaluation today patient appears to be doing very well in regard to his wound. In fact this  appears to be very close to complete resolution which is great news. I think that he has come a very long way in the time that we have been seeing him. His wife is taking great care of this. 12/03/2021 upon evaluation today patient appears to be doing okay in regard to his wound although its not quite as good as I was hoping for. He still has significant issues here with some drainage and there is a small opening towards the ankle region that has me mostly concerned at this point. I discussed this with the patient and his wife today. She helps take care of them. Fortunately there does not appear to be any signs of active infection locally nor systemically at this point. No fevers, chills, nausea, vomiting, or diarrhea. 12/30; patient with a traumatic wound on the right lateral malleolus in the setting of scar tissue from a remote injury and surgery in this area. He has been using silver collagen changing 3 times a week but moistening this only with water/distilled water. He came in with quite a bit of eschar on the wound surface 01/07/2022 upon evaluation today patient appears to be doing well with regard to his wound. In fact this is mostly skin covered there is just a very small sinus tract which actually extends down to the area that appears to be bone on the lateral aspect of the ankle. There really is not much of a space here other than for me to get a small skinny probe it and only goes down about 3 mm. Nonetheless this is where it seems to be draining from only and this is the 1 area that has been of concern to be all along. Previously we discussed an MRI but the patient and his wife did not want to do that at that time. Currently they are inquiring as to whether or not we could try an antibiotic and see if that would help to get this last little placed to seal up I can definitely give this  a shot although I am not certain it is can make the improvement overall that we want to see here  or not. 02/14/2022 upon evaluation today patient appears to be doing well with regard to his wound all things considered. This is actually showing signs of good improvement in my opinion I am very pleased in that regard. I do not see any signs of active infection which is great news. Objective Constitutional Well-nourished and well-hydrated in no acute distress. Vitals Time Taken: 2:51 PM, Height: 66 in, Weight: 161 lbs, BMI: 26, Temperature: 98.2 F, Pulse: 94 bpm, Respiratory Rate: 16 breaths/min, Blood Pressure: 156/106 mmHg. Respiratory normal breathing without difficulty. Psychiatric this patient is able to make decisions and demonstrates good insight into disease process. Alert and Oriented x 3. pleasant and cooperative. General Notes: Upon inspection patient's wound bed actually showed signs of good improvement which is great news. I am very pleased with where we stand today. I do not see any signs of active infection I do think that this area is almost completely healed. I think just using a small dab of mupirocin ointment would be an okay thing to do just to try to help keep the area moist and allow it to hopefully heal and I am hoping that this will be done in the matter of just a few weeks completely. Integumentary (Hair, Skin) Wound #1 status is Open. Original cause of wound was Trauma. The date acquired was: 05/09/2021. The wound has been in treatment 32 weeks. The wound is located on the Right,Lateral Ankle. The wound measures 0.1cm length x 0.1cm width x 0.3cm depth; 0.008cm^2 area and 0.002cm^3 volume. There is a medium amount of serosanguineous drainage noted. There is large (67-100%) pink granulation within the wound bed. There is a small (1-33%) amount of necrotic tissue within the wound bed including Adherent Slough. Assessment Active Problems ICD-10 Laceration without foreign body, right ankle, initial encounter RALSTON, VENUS ([id number]) Non-pressure chronic ulcer  of right ankle with fat layer exposed Essential (primary) hypertension Plan Follow-up Appointments: Return Appointment in 3 weeks. - per patient requrest Bathing/ Shower/ Hygiene: May shower; gently cleanse wound with antibacterial soap, rinse and pat dry prior to dressing wounds Edema Control - Lymphedema / Segmental Compressive Device / Other: Elevate, Exercise Daily and Avoid Standing for Long Periods of Time. Elevate legs to the level of the heart and pump ankles as often as possible Elevate leg(s) parallel to the floor when sitting. The following medication(s) was prescribed: mupirocin topical 2 % ointment ointment topical applied in a small dab to the wound opening as directed in the clinic 3 time per week starting 01/28/2022 WOUND #1: - Ankle Wound Laterality: Right, Lateral Cleanser: Soap and Water 3 x Per Day/30 Days Discharge Instructions: Gently cleanse wound with antibacterial soap, rinse and pat dry prior to dressing wounds Topical: Mupirocin Ointment (Generic) 3 x Per Day/30 Days Discharge Instructions: Apply a small dab to the open wound area then cover with alginate per below Primary Dressing: Silvercel Small 2x2 (in/in) 3 x Per Day/30 Days Discharge Instructions: Apply Silvercel Small 2x2 (in/in) as instructed Secondary Dressing: Zetuvit Plus Silicone Border Dressing 4x4 (in/in) 3 x Per Day/30 Days 1. Would recommend currently that we going to continue with the wound care measures as before and the patient is in agreement with plan. This includes the use of the silver alginate dressing that we will put just a small dab of mupirocin ointment over the opening of the wound underneath. 2. Would  recommend as well that we continue with the Zetuvit border foam dressing to cover. 3. We will also continue to have the patient elevate his legs much as possible and use the Tubigrip which I think is doing decently well. We will see patient back for reevaluation in 3 weeks here in the  clinic. If anything worsens or changes patient will contact our office for additional recommendations. Electronic Signature(s) Signed: 01/28/2022 3:17:14 PM By: Worthy Keeler PA-C Entered By: Worthy Keeler on 01/28/2022 15:17:14 [name] ([id number]) -------------------------------------------------------------------------------- SuperBill Details Patient Name: [name] Date of Service: 01/28/2022 Medical Record Number: [id number] Patient Account Number: [id number] Date of Birth/Sex: 10/04/1961 (61 y.o. M) Treating RN: Cornell Barman Primary Care Provider: Halina Maidens Other Clinician: Referring Provider: Halina Maidens Treating Provider/Extender: Skipper Cliche in Treatment: 32 Diagnosis Coding ICD-10 Codes Code Description [phone] Laceration without foreign body, right ankle, initial encounter T51.761 Non-pressure chronic ulcer of right ankle with fat layer exposed [address] (primary) hypertension Facility Procedures CPT4 Code: 60737106 Description: 99213 - WOUND CARE VISIT-LEV 3 EST PT Modifier: Quantity: 1 Physician Procedures CPT4 Code: [id number] Description: 62703 - WC PHYS LEVEL 4 - EST PT Modifier: Quantity: 1 CPT4 Code: Description: ICD-10 Diagnosis Description J00.938H Laceration without foreign body, right ankle, initial encounter W29.937 Non-pressure chronic ulcer of right ankle with fat layer exposed I10 Essential (primary) hypertension Modifier: Quantity: Electronic Signature(s) Signed: 01/28/2022 4:48:07 PM By: Worthy Keeler PA-C Signed: 02/01/2022 9:26:54 AM By: Gretta Cool, BSN, RN, CWS, Kim RN, BSN Previous Signature: 01/28/2022 3:17:23 PM Version By: Worthy Keeler PA-C Entered By: Gretta Cool, BSN, RN, CWS, Kim on 01/28/2022 15:20:10

## 2022-02-01 NOTE — Progress Notes (Signed)
[name], [name] ([id number]) Visit Report for 01/28/2022 Arrival Information Details Patient Name: [name], [name] Date of Service: 01/28/2022 2:45 PM Medical Record Number: [id number] Patient Account Number: [id number] Date of Birth/Sex: January 04, 1961 (61 y.o. M) Treating RN: Cornell Barman Primary Care Pailynn Vahey: Halina Maidens Other Clinician: Referring Wendi Lastra: Halina Maidens Treating Zeda Gangwer/Extender: Skipper Cliche in Treatment: 20 Visit Information History Since Last Visit Pain Present Now: Yes Patient Arrived: Ambulatory Arrival Time: 14:49 Transfer Assistance: None Patient Identification Verified: Yes Secondary Verification Process Completed: Yes Patient Requires Transmission-Based Precautions: No Patient Has Alerts: Yes Patient Alerts: NOT diabetic Electronic Signature(s) Signed: 02/01/2022 9:26:54 AM By: Gretta Cool, BSN, RN, CWS, Kim RN, BSN Entered By: Gretta Cool, BSN, RN, CWS, Kim on 01/28/2022 14:50:10 [name] ([id number]) -------------------------------------------------------------------------------- Clinic Level of Care Assessment Details Patient Name: [name] Date of Service: 01/28/2022 2:45 PM Medical Record Number: [id number] Patient Account Number: [id number] Date of Birth/Sex: Jan 09, 1961 (61 y.o. M) Treating RN: Cornell Barman Primary Care Nilay Mangrum: Halina Maidens Other Clinician: Referring Dewayne Severe: Halina Maidens Treating Tabbatha Bordelon/Extender: Skipper Cliche in Treatment: 24 Clinic Level of Care Assessment Items TOOL 4 Quantity Score [] - Use when only an EandM is performed on FOLLOW-UP visit 0 ASSESSMENTS - Nursing Assessment / Reassessment X - Reassessment of Co-morbidities (includes updates in patient status) 1 10 X- 1 5 Reassessment of Adherence to Treatment Plan ASSESSMENTS - Wound and Skin Assessment / Reassessment X - Simple Wound Assessment / Reassessment - one wound 1 5 [] - 0 Complex Wound Assessment / Reassessment - multiple  wounds [] - 0 Dermatologic / Skin Assessment (not related to wound area) ASSESSMENTS - Focused Assessment [] - Circumferential Edema Measurements - multi extremities 0 [] - 0 Nutritional Assessment / Counseling / Intervention [] - 0 Lower Extremity Assessment (monofilament, tuning fork, pulses) [] - 0 Peripheral Arterial Disease Assessment (using hand held doppler) ASSESSMENTS - Ostomy and/or Continence Assessment and Care [] - Incontinence Assessment and Management 0 [] - 0 Ostomy Care Assessment and Management (repouching, etc.) PROCESS - Coordination of Care X - Simple Patient / Family Education for ongoing care 1 15 [] - 0 Complex (extensive) Patient / Family Education for ongoing care [] - 0 Staff obtains Programmer, systems, Records, Test Results / Process Orders [] - 0 Staff telephones HHA, Nursing Homes / Clarify orders / etc [] - 0 Routine Transfer to another Facility (non-emergent condition) [] - 0 Routine Hospital Admission (non-emergent condition) [] - 0 New Admissions / Biomedical engineer / Ordering NPWT, Apligraf, etc. [] - 0 Emergency Hospital Admission (emergent condition) X- 1 10 Simple Discharge Coordination [] - 0 Complex (extensive) Discharge Coordination PROCESS - Special Needs [] - Pediatric / Minor Patient Management 0 [] - 0 Isolation Patient Management [] - 0 Hearing / Language / Visual special needs [] - 0 Assessment of Community assistance (transportation, D/C planning, etc.) [] - 0 Additional assistance / Altered mentation [] - 0 Support Surface(s) Assessment (bed, cushion, seat, etc.) INTERVENTIONS - Wound Cleansing / Measurement [name], [name]. ([id number]) X- 1 5 Simple Wound Cleansing - one wound [] - 0 Complex Wound Cleansing - multiple wounds X- 1 5 Wound Imaging (photographs - any number of wounds) [] - 0 Wound Tracing (instead of photographs) X- 1 5 Simple Wound Measurement - one wound [] - 0 Complex Wound Measurement -  multiple wounds INTERVENTIONS - Wound Dressings X - Small Wound Dressing one or multiple wounds 1 10 [] - 0 Medium Wound Dressing one or multiple  wounds [] - 0 Large Wound Dressing one or multiple wounds X- 1 5 Application of Medications - topical [] - 0 Application of Medications - injection INTERVENTIONS - Miscellaneous [] - External ear exam 0 [] - 0 Specimen Collection (cultures, biopsies, blood, body fluids, etc.) [] - 0 Specimen(s) / Culture(s) sent or taken to Lab for analysis [] - 0 Patient Transfer (multiple staff / Civil Service fast streamer / Similar devices) [] - 0 Simple Staple / Suture removal (25 or less) [] - 0 Complex Staple / Suture removal (26 or more) [] - 0 Hypo / Hyperglycemic Management (close monitor of Blood Glucose) [] - 0 Ankle / Brachial Index (ABI) - do not check if billed separately X- 1 5 Vital Signs Has the patient been seen at the hospital within the last three years: Yes Total Score: 80 Level Of Care: New/Established - Level 3 Electronic Signature(s) Signed: 02/01/2022 9:26:54 AM By: Gretta Cool, BSN, RN, CWS, Kim RN, BSN Entered By: Gretta Cool, BSN, RN, CWS, Kim on 01/28/2022 15:20:01 [name] ([id number]) -------------------------------------------------------------------------------- Encounter Discharge Information Details Patient Name: [name] Date of Service: 01/28/2022 2:45 PM Medical Record Number: [id number] Patient Account Number: [id number] Date of Birth/Sex: Jul 31, 1961 (61 y.o. M) Treating RN: Cornell Barman Primary Care Provider: Halina Maidens Other Clinician: Referring Provider: Halina Maidens Treating Provider/Extender: Skipper Cliche in Treatment: 32 Encounter Discharge Information Items Discharge Condition: Stable Ambulatory Status: Ambulatory Discharge Destination: Home Transportation: Private Auto Accompanied By: wife Schedule Follow-up Appointment: Yes Clinical Summary of Care: Electronic Signature(s) Signed: 02/01/2022  9:26:54 AM By: Gretta Cool, BSN, RN, CWS, Kim RN, BSN Entered By: Gretta Cool, BSN, RN, CWS, Kim on 01/28/2022 15:23:54 [name] ([id number]) -------------------------------------------------------------------------------- Lower Extremity Assessment Details Patient Name: [name] Date of Service: 01/28/2022 2:45 PM Medical Record Number: [id number] Patient Account Number: [id number] Date of Birth/Sex: Jul 06, 1961 (61 y.o. M) Treating RN: Cornell Barman Primary Care Provider: Halina Maidens Other Clinician: Referring Provider: Halina Maidens Treating Provider/Extender: Jeri Cos Weeks in Treatment: 32 Edema Assessment Assessed: Shirlyn Goltz: No] Patrice Paradise: No] Edema: [Left: N] [Right: o] Vascular Assessment Pulses: Dorsalis Pedis Palpable: [Right:Yes] Electronic Signature(s) Signed: 02/01/2022 9:26:54 AM By: Gretta Cool, BSN, RN, CWS, Kim RN, BSN Entered By: Gretta Cool, BSN, RN, CWS, Kim on 01/28/2022 14:59:15 [name] ([id number]) -------------------------------------------------------------------------------- Multi Wound Chart Details Patient Name: [name] Date of Service: 01/28/2022 2:45 PM Medical Record Number: [id number] Patient Account Number: [id number] Date of Birth/Sex: 1961/10/08 (61 y.o. M) Treating RN: Cornell Barman Primary Care Provider: Halina Maidens Other Clinician: Referring Provider: Halina Maidens Treating Provider/Extender: Skipper Cliche in Treatment: 32 Vital Signs Height(in): 66 Pulse(bpm): 94 Weight(lbs): 161 Blood Pressure(mmHg): 156/106 Body Mass Index(BMI): 26 Temperature(F): 98.2 Respiratory Rate(breaths/min): 16 Photos: [N/A:N/A] Wound Location: Right, Lateral Ankle N/A N/A Wounding Event: Trauma N/A N/A Primary Etiology: Trauma, Other N/A N/A Comorbid History: Hypertension N/A N/A Date Acquired: 05/09/2021 N/A N/A Weeks of Treatment: 32 N/A N/A Wound Status: Open N/A N/A Wound Recurrence: No N/A N/A Measurements L x W x D (cm)  0.1x0.1x0.3 N/A N/A Area (cm) : 0.008 N/A N/A Volume (cm) : 0.002 N/A N/A % Reduction in Area: 99.00% N/A N/A % Reduction in Volume: 99.50% N/A N/A Classification: Full Thickness Without Exposed N/A N/A Support Structures Exudate Amount: Medium N/A N/A Exudate Type: Serosanguineous N/A N/A Exudate Color: red, brown N/A N/A Granulation Amount: Large (67-100%) N/A N/A Granulation Quality: Pink N/A N/A Necrotic Amount: Small (1-33%) N/A N/A Exposed Structures: Fascia: No N/A N/A Fat Layer (Subcutaneous Tissue): No  Tendon: No Muscle: No Joint: No Bone: No Epithelialization: None N/A N/A Treatment Notes Electronic Signature(s) Signed: 02/01/2022 9:26:54 AM By: Gretta Cool, BSN, RN, CWS, Kim RN, BSN Entered By: Gretta Cool, BSN, RN, CWS, Kim on 01/28/2022 15:16:24 [name] ([id number]) -------------------------------------------------------------------------------- [place] Details Patient Name: [name] Date of Service: 01/28/2022 2:45 PM Medical Record Number: [id number] Patient Account Number: [id number] Date of Birth/Sex: [birth year] (61 y.o. M) Treating RN: Cornell Barman Primary Care Elius Etheredge: Halina Maidens Other Clinician: Referring Jayceon Troy: Halina Maidens Treating Jakory Matsuo/Extender: Skipper Cliche in Treatment: 32 Active Inactive Wound/Skin Impairment Nursing Diagnoses: Knowledge deficit related to ulceration/compromised skin integrity Goals: Patient/caregiver will verbalize understanding of skin care regimen Date Initiated: 06/18/2021 Target Resolution Date: 09/18/2021 Goal Status: Active Ulcer/skin breakdown will have a volume reduction of 30% by week 4 Date Initiated: 06/18/2021 Date Inactivated: 07/16/2021 Target Resolution Date: 07/18/2021 Goal Status: Unmet Unmet Reason: comorbities Ulcer/skin breakdown will have a volume reduction of 50% by week 8 Date Initiated: 06/18/2021 Date Inactivated: 08/20/2021 Target Resolution Date:  08/18/2021 Goal Status: Unmet Unmet Reason: comorbites Ulcer/skin breakdown will have a volume reduction of 80% by week 12 Date Initiated: 06/18/2021 Date Inactivated: 10/01/2021 Target Resolution Date: 09/18/2021 Goal Status: Met Ulcer/skin breakdown will heal within 14 weeks Date Initiated: 06/18/2021 Target Resolution Date: 10/18/2021 Goal Status: Active Interventions: Assess patient/caregiver ability to obtain necessary supplies Assess patient/caregiver ability to perform ulcer/skin care regimen upon admission and as needed Assess ulceration(s) every visit Notes: Electronic Signature(s) Signed: 02/01/2022 9:26:54 AM By: Gretta Cool, BSN, RN, CWS, Kim RN, BSN Entered By: Gretta Cool, BSN, RN, CWS, Kim on 01/28/2022 15:16:16 [name] ([id number]) -------------------------------------------------------------------------------- Pain Assessment Details Patient Name: [name] Date of Service: 01/28/2022 2:45 PM Medical Record Number: [id number] Patient Account Number: [id number] Date of Birth/Sex: Sep 23, 1961 (61 y.o. M) Treating RN: Cornell Barman Primary Care Assad Harbeson: Halina Maidens Other Clinician: Referring Judyth Demarais: Halina Maidens Treating Janelle Culton/Extender: Skipper Cliche in Treatment: 32 Active Problems Location of Pain Severity and Description of Pain Patient Has Paino No Site Locations Rate the pain. Current Pain Level: 3 Character of Pain Describe the Pain: Throbbing Pain Management and Medication Current Pain Management: Electronic Signature(s) Signed: 02/01/2022 9:26:54 AM By: Gretta Cool, BSN, RN, CWS, Kim RN, BSN Entered By: Gretta Cool, BSN, RN, CWS, Kim on 01/28/2022 14:52:24 [name] ([id number]) -------------------------------------------------------------------------------- Patient/Caregiver Education Details Patient Name: [name] Date of Service: 01/28/2022 2:45 PM Medical Record Number: [id number] Patient Account Number: [id number] Date of  Birth/Gender: 10-02-1961 (61 y.o. M) Treating RN: Cornell Barman Primary Care Physician: Halina Maidens Other Clinician: Referring Physician: Halina Maidens Treating Physician/Extender: Skipper Cliche in Treatment: 77 Education Assessment Education Provided To: Patient Education Topics Provided Wound/Skin Impairment: Handouts: Caring for Your Ulcer, Other: wound care as prescribed Methods: Demonstration, Explain/Verbal Responses: State content correctly Electronic Signature(s) Signed: 02/01/2022 9:26:54 AM By: Gretta Cool, BSN, RN, CWS, Kim RN, BSN Entered By: Gretta Cool, BSN, RN, CWS, Kim on 01/28/2022 15:20:56 [name] ([id number]) -------------------------------------------------------------------------------- Wound Assessment Details Patient Name: [name] Date of Service: 01/28/2022 2:45 PM Medical Record Number: [id number] Patient Account Number: [id number] Date of Birth/Sex: Aug 08, 1961 (61 y.o. M) Treating RN: Cornell Barman Primary Care Bobbyjo Marulanda: Halina Maidens Other Clinician: Referring [place] Weekly: Halina Maidens Treating Francesco Provencal/Extender: Jeri Cos Weeks in Treatment: 32 Wound Status Wound Number: 1 Primary Etiology: Trauma, Other Wound Location: Right, Lateral Ankle Wound Status: Open Wounding Event: Trauma Comorbid History: Hypertension Date Acquired: 05/09/2021 Weeks Of Treatment: 32 Clustered Wound: No Photos Wound Measurements Length: (cm)  0.1 Width: (cm) 0.1 Depth: (cm) 0.3 Area: (cm) 0.008 Volume: (cm) 0.002 % Reduction in Area: 99% % Reduction in Volume: 99.5% Epithelialization: None Wound Description Classification: Full Thickness Without Exposed Support Structures Exudate Amount: Medium Exudate Type: Serosanguineous Exudate Color: red, brown Foul Odor After Cleansing: No Slough/Fibrino Yes Wound Bed Granulation Amount: Large (67-100%) Exposed Structure Granulation Quality: Pink Fascia Exposed: No Necrotic Amount: Small (1-33%) Fat  Layer (Subcutaneous Tissue) Exposed: No Necrotic Quality: Adherent Slough Tendon Exposed: No Muscle Exposed: No Joint Exposed: No Bone Exposed: No Treatment Notes Wound #1 (Ankle) Wound Laterality: Right, Lateral Cleanser Soap and Water Discharge Instruction: Gently cleanse wound with antibacterial soap, rinse and pat dry prior to dressing wounds Peri-Wound Care [name], [name] ([id number]) Topical Mupirocin Ointment Discharge Instruction: Apply a small dab to the open wound area then cover with alginate per below Primary Dressing Silvercel Small 2x2 (in/in) Discharge Instruction: Apply Silvercel Small 2x2 (in/in) as instructed Secondary Dressing Zetuvit Plus Silicone Border Dressing 4x4 (in/in) Secured With Compression Wrap Compression Stockings Environmental education officer) Signed: 02/01/2022 9:26:54 AM By: Gretta Cool, BSN, RN, CWS, Kim RN, BSN Entered By: Gretta Cool, BSN, RN, CWS, Kim on 01/28/2022 14:58:49 [name] ([id number]) -------------------------------------------------------------------------------- [place] Details Patient Name: [name] Date of Service: 01/28/2022 2:45 PM Medical Record Number: [id number] Patient Account Number: [id number] Date of Birth/Sex: 05/27/1961 (61 y.o. M) Treating RN: Cornell Barman Primary Care Zyah Gomm: Halina Maidens Other Clinician: Referring Quincey Nored: Halina Maidens Treating Brianca Fortenberry/Extender: Skipper Cliche in Treatment: 32 Vital Signs Time Taken: 14:51 Temperature (F): 98.2 Height (in): 66 Pulse (bpm): 94 Weight (lbs): 161 Respiratory Rate (breaths/min): 16 Body Mass Index (BMI): 26 Blood Pressure (mmHg): 156/106 Reference Range: 80 - 120 mg / dl Electronic Signature(s) Signed: 02/01/2022 9:26:54 AM By: Gretta Cool, BSN, RN, CWS, Kim RN, BSN Entered By: Gretta Cool, BSN, RN, CWS, Kim on 01/28/2022 14:51:46

## 2022-02-18 ENCOUNTER — Encounter: Payer: 59 | Admitting: Physician Assistant

## 2022-02-18 ENCOUNTER — Other Ambulatory Visit: Payer: Self-pay

## 2022-02-18 DIAGNOSIS — S91011A Laceration without foreign body, right ankle, initial encounter: Secondary | ICD-10-CM | POA: Diagnosis not present

## 2022-02-18 NOTE — Progress Notes (Signed)
THOMOS, DOMINE ([id number]) Visit Report for 02/18/2022 Chief Complaint Document Details Patient Name: [name], [name]. Date of Service: 02/18/2022 2:00 PM Medical Record Number: [id number] Patient Account Number: [id number] Date of Birth/Sex: 1961/06/19 (61 y.o. M) Treating RN: Carlene Coria Primary Care Provider: Halina Maidens Other Clinician: Referring Provider: Halina Maidens Treating Provider/Extender: Skipper Cliche in Treatment: 46 Information Obtained from: Patient Chief Complaint Right ankle laceration Electronic Signature(s) Signed: 02/18/2022 2:24:24 PM By: Worthy Keeler PA-C Entered By: Worthy Keeler on 02/18/2022 14:24:24 [name] ([id number]) -------------------------------------------------------------------------------- HPI Details Patient Name: [name] Date of Service: 02/18/2022 2:00 PM Medical Record Number: [id number] Patient Account Number: [id number] Date of Birth/Sex: [birth year] (61 y.o. M) Treating RN: Carlene Coria Primary Care Provider: Halina Maidens Other Clinician: Referring Provider: Halina Maidens Treating Provider/Extender: Skipper Cliche in Treatment: 35 History of Present Illness HPI Description: 06/14/2021 upon evaluation today patient appears to be doing somewhat poorly in regard to the wound on his right ankle region. This was an injury that apparently occurred at work according to what he is telling me today. He is seen with his wife in the office at this point. Nonetheless the patient appears to have a circular opening with necrotic tissue in the base of the wound. This does not appear to be doing nearly as good as I would hope. Fortunately there does not appear to be any signs of infection though he does have a significant amount of necrotic tissue this is going to take some time to heal. He also has previously had surgery in this area there is a lot of scar tissue that will again complicate things and extended  healing process. The patient does have hypertension otherwise no major medical problems. 06/25/2021 upon evaluation today patient presents for reevaluation here in our clinic he actually does seem to be doing better in regard to his wound on the right lateral ankle. He has been tolerating the dressing changes without complication. Fortunately I do not see any signs of infection which is great news. 07/02/2021 upon evaluation today patient appears to be doing about the same in regard to his wound. With that being said I do believe that this is cleaner there was a little bit of slough noted we have been using Iodoflex. I was able to see a little bit more granulation tissue starting to try to build up but I think the scar tissue here is going to be the biggest slowing factor with regard to healing. That was discussed with the patient and his wife today. Nonetheless I think we want to switch up things based on what I see at this point. 07/09/2021 upon evaluation today patient appears to be doing about the same in regard to his ankle ulcer. He has been tolerating the dressing changes without complication. Fortunately there does not appear to be any signs of active infection. No fevers, chills, nausea, vomiting, or diarrhea. 07/16/2021 upon evaluation today patient appears to be doing better to some degree in regard to his wound on ankle region. He actually showed some signs of new granulation growth which is actually a good thing. Fortunately there does not appear to be any evidence of active infection and wound did not appear to be too dry upon evaluation today which is also good news. No fevers, chills, nausea, vomiting, or diarrhea. 07/23/2021 upon evaluation today patient appears to be doing a little better in regard to his wound. He has been tolerating the dressing changes without complication. Fortunately  there does not appear to be any signs of active infection which is great news. No fever chills noted we  are still waiting on the results back from the TheraSkin investigation to see whether or not he qualifies for this treatment option. 07/30/2021 upon evaluation today patient appears to be doing a little better again in regard to his wound. He is showing signs of improvement which is great news. I am very pleased with where things stand. I do believe that honestly all things considered he is making good progress but were just not able to do anything as far as advanced modalities to speed this up as insurance has literally devout denied everything including a snap VAC, Apligraf, PuraPly, and TheraSkin. Again I am not really certain what else to do otherwise other than just try to continue along the path we are as far as getting this wound to heal. 08/06/2021 upon evaluation today patient appears to be doing well currently he is of peering to make progress slowly but nonetheless this is at least making progress at this point. There does not appear to be any signs of active infection at this time. No fevers, chills, nausea, vomiting, or diarrhea. With that being said I do think that we are headed in the right direction with what I am seeing currently. I do wish we could do the snap VAC but to be honest I think this is the best we can do with the collagen as it stands currently. 08/13/2021 upon evaluation today patient unfortunately appears to be doing a little bit more poorly in regard to the fact that he is having some pain on the side of his ankle. This does have me a little bit concerned this is on the edge of the wound. There is a very hard area under this that I presume to be more of a calcification although I cannot be certain this is not just a superficial bony protrusion. I do feel it in the base of the wound but again I assumed more calcification due to inflammation from prior surgery but again that may not actually be the case. I do think that we probably need to go forward with an x-ray at this  point to see if there is anything going on that could be a more significant issue here. Depending on the results of the x-ray we may be looking at an MRI as well. 08/17/2021 upon evaluation today patient appears to be doing well with regard to his ankle ulcer. This actually is showing some signs of improvement which is great news and overall very pleased in that regard. Fortunately there does not appear to be any signs of active infection which is great news as well. No fevers, chills, nausea, vomiting, or diarrhea. With that being said the x-ray showed some surgical abnormalities and along the lateral ankle as well there was some mention of an irregularity. With that being said there was nothing specifically to indicate osteomyelitis as a probable diagnosis here. With that being said I did discuss this with the patient and his wife today their opinion is to hold off on an MRI at this point especially since his wound seems to be showing signs of healing. Fortunately there is no evidence of active infection systemically at this point. 09/03/2021 upon evaluation today patient appears to be doing well with regard to his wound. He has been tolerating the dressing changes without complication. Fortunately there does not appear to be any signs of active infection which  is also great news. In general I am very happy with where we stand. 09/17/2021 upon evaluation today patient appears to be doing well currently in regard to his wound. He has been tolerating the dressing changes without complication. Fortunately there does not appear to be any evidence of active infection which is great news. No fevers, chills, nausea, vomiting, or diarrhea. 10/01/2021 upon evaluation today patient appears to be doing well with regard to his ankle ulcer. He has been tolerating the dressing changes without complication. Fortunately there does not appear to be any signs of infection which is great and overall I am extremely pleased  with where we stand today. No fevers, chills, nausea, vomiting, or diarrhea. [name], [name] ([id number]) 10/22/2021 upon evaluation today patient appears to be doing well with regard to his wound on the ankle region. Fortunately there does not appear to be any signs of active infection at this time. I think that he has made significant improvement since he was last here and this is great news. 11/12/2021 upon evaluation today patient appears to be doing very well in regard to his wound. In fact this appears to be very close to complete resolution which is great news. I think that he has come a very long way in the time that we have been seeing him. His wife is taking great care of this. 12/03/2021 upon evaluation today patient appears to be doing okay in regard to his wound although its not quite as good as I was hoping for. He still has significant issues here with some drainage and there is a small opening towards the ankle region that has me mostly concerned at this point. I discussed this with the patient and his wife today. She helps take care of them. Fortunately there does not appear to be any signs of active infection locally nor systemically at this point. No fevers, chills, nausea, vomiting, or diarrhea. 12/30; patient with a traumatic wound on the right lateral malleolus in the setting of scar tissue from a remote injury and surgery in this area. He has been using silver collagen changing 3 times a week but moistening this only with water/distilled water. He came in with quite a bit of eschar on the wound surface 01/07/2022 upon evaluation today patient appears to be doing well with regard to his wound. In fact this is mostly skin covered there is just a very small sinus tract which actually extends down to the area that appears to be bone on the lateral aspect of the ankle. There really is not much of a space here other than for me to get a small skinny probe it and only goes down  about 3 mm. Nonetheless this is where it seems to be draining from only and this is the 1 area that has been of concern to be all along. Previously we discussed an MRI but the patient and his wife did not want to do that at that time. Currently they are inquiring as to whether or not we could try an antibiotic and see if that would help to get this last little placed to seal up I can definitely give this a shot although I am not certain it is can make the improvement overall that we want to see here or not. 01/28/2022 upon evaluation today patient appears to be doing well with regard to his wound all things considered. This is actually showing signs of good improvement in my opinion I am very pleased in that regard.  I do not see any signs of active infection which is great news. 02/18/2022 upon evaluation today patient unfortunately really did not show any signs of improvement even compared to when I saw him last. We really are doing about the same compared to then. Nonetheless my biggest concern at this point is simply the fact that the patient is not showing signs of significant improvement he did seem to get better while on the doxycycline but it did not completely clear. We discussed the possibility of going forward with an MRI although he is really not interested in that currently. This is something I discussed with him several times in the past. With that being said his injury is so old that he feels like that really would not affect this at this point. Nonetheless with the recent open wound that he had I feel like that is where the potential for infection would have come from. Electronic Signature(s) Signed: 02/18/2022 2:57:51 PM By: Worthy Keeler PA-C Entered By: Worthy Keeler on 02/18/2022 14:57:51 [name] ([id number]) -------------------------------------------------------------------------------- Physical Exam Details Patient Name: [name] Date of Service:  02/18/2022 2:00 PM Medical Record Number: [id number] Patient Account Number: [id number] Date of Birth/Sex: 18-Mar-1961 (61 y.o. M) Treating RN: Carlene Coria Primary Care Provider: Halina Maidens Other Clinician: Referring Provider: Halina Maidens Treating Provider/Extender: Skipper Cliche in Treatment: 35 Constitutional patient is hypotensive.. Well-nourished and well-hydrated in no acute distress. Respiratory normal breathing without difficulty. Psychiatric this patient is able to make decisions and demonstrates good insight into disease process. Alert and Oriented x 3. pleasant and cooperative. Notes Upon inspection patient's wound bed actually showed signs of good granulation and epithelization for the most part. There is just a very small pinpoint opening unfortunately this is not even enough for me to be able to obtain a culture. That would be ideal if we could identify what bacteria was causing the problem here. Unfortunately that is just not the case. For that reason I may try 1 more antibiotic to see if this could be beneficial for him. He is in agreement with the plan. Electronic Signature(s) Signed: 02/18/2022 2:58:34 PM By: Worthy Keeler PA-C Entered By: Worthy Keeler on 02/18/2022 14:58:34 [name] ([id number]) -------------------------------------------------------------------------------- Physician Orders Details Patient Name: [name] Date of Service: 02/18/2022 2:00 PM Medical Record Number: [id number] Patient Account Number: [id number] Date of Birth/Sex: 05-09-1961 (61 y.o. M) Treating RN: Carlene Coria Primary Care Provider: Halina Maidens Other Clinician: Referring Provider: Halina Maidens Treating Provider/Extender: Skipper Cliche in Treatment: 12 Verbal / Phone Orders: No Diagnosis Coding ICD-10 Coding Code Description U63.335K Laceration without foreign body, right ankle, initial encounter L97.312 Non-pressure chronic ulcer of right  ankle with fat layer exposed [place] (primary) hypertension Follow-up Appointments o Return Appointment in 3 weeks. - per patient request Bathing/ Shower/ Hygiene o May shower; gently cleanse wound with antibacterial soap, rinse and pat dry prior to dressing wounds Edema Control - Lymphedema / Segmental Compressive Device / Other o Elevate, Exercise Daily and Avoid Standing for Long Periods of Time. o Elevate legs to the level of the heart and pump ankles as often as possible o Elevate leg(s) parallel to the floor when sitting. Additional Orders / Instructions o Other: - Out of work today, return tomorrow. Wound Treatment Wound #1 - Ankle Wound Laterality: Right, Lateral Cleanser: Soap and Water 3 x Per Day/30 Days Discharge Instructions: Gently cleanse wound with antibacterial soap, rinse and pat dry prior to dressing  wounds Topical: Mupirocin Ointment (Generic) 3 x Per Day/30 Days Discharge Instructions: Apply a small dab to the open wound area then cover with alginate per below Primary Dressing: Silvercel Small 2x2 (in/in) 3 x Per Day/30 Days Discharge Instructions: Apply Silvercel Small 2x2 (in/in) as instructed Secondary Dressing: Zetuvit Plus Silicone Border Dressing 4x4 (in/in) 3 x Per Day/30 Days Patient Medications Allergies: No Known Drug Allergies Notifications Medication Indication Start End Levaquin 02/18/2022 DOSE 1 - oral 500 mg tablet - 1 tablet oral taken 1 time per day for 21 days Electronic Signature(s) Signed: 02/18/2022 2:59:49 PM By: Worthy Keeler PA-C Entered By: Worthy Keeler on 02/18/2022 14:59:49 [name] ([id number]) -------------------------------------------------------------------------------- Problem List Details Patient Name: [name] Date of Service: 02/18/2022 2:00 PM Medical Record Number: [id number] Patient Account Number: [id number] Date of Birth/Sex: 1961-02-02 (61 y.o. M) Treating RN: Carlene Coria Primary  Care Provider: Halina Maidens Other Clinician: Referring Provider: Halina Maidens Treating Provider/Extender: Skipper Cliche in Treatment: 35 Active Problems ICD-10 Encounter Code Description Active Date MDM Diagnosis S91.011A Laceration without foreign body, right ankle, initial encounter 06/18/2021 No Yes L97.312 Non-pressure chronic ulcer of right ankle with fat layer exposed 06/18/2021 No Yes I10 Essential (primary) hypertension 06/18/2021 No Yes Inactive Problems Resolved Problems Electronic Signature(s) Signed: 02/18/2022 2:24:12 PM By: Worthy Keeler PA-C Entered By: Worthy Keeler on 02/18/2022 14:24:12 [name] ([id number]) -------------------------------------------------------------------------------- Progress Note Details Patient Name: [name] Date of Service: 02/18/2022 2:00 PM Medical Record Number: [id number] Patient Account Number: [id number] Date of Birth/Sex: 09-02-1961 (61 y.o. M) Treating RN: Carlene Coria Primary Care Provider: Halina Maidens Other Clinician: Referring Provider: Halina Maidens Treating Provider/Extender: Skipper Cliche in Treatment: 3 Subjective Chief Complaint Information obtained from Patient Right ankle laceration History of Present Illness (HPI) 06/14/2021 upon evaluation today patient appears to be doing somewhat poorly in regard to the wound on his right ankle region. This was an injury that apparently occurred at work according to what he is telling me today. He is seen with his wife in the office at this point. Nonetheless the patient appears to have a circular opening with necrotic tissue in the base of the wound. This does not appear to be doing nearly as good as I would hope. Fortunately there does not appear to be any signs of infection though he does have a significant amount of necrotic tissue this is going to take some time to heal. He also has previously had surgery in this area there is a lot of scar  tissue that will again complicate things and extended healing process. The patient does have hypertension otherwise no major medical problems. 06/25/2021 upon evaluation today patient presents for reevaluation here in our clinic he actually does seem to be doing better in regard to his wound on the right lateral ankle. He has been tolerating the dressing changes without complication. Fortunately I do not see any signs of infection which is great news. 07/02/2021 upon evaluation today patient appears to be doing about the same in regard to his wound. With that being said I do believe that this is cleaner there was a little bit of slough noted we have been using Iodoflex. I was able to see a little bit more granulation tissue starting to try to build up but I think the scar tissue here is going to be the biggest slowing factor with regard to healing. That was discussed with the patient and his wife today. Nonetheless I think we  want to switch up things based on what I see at this point. 07/09/2021 upon evaluation today patient appears to be doing about the same in regard to his ankle ulcer. He has been tolerating the dressing changes without complication. Fortunately there does not appear to be any signs of active infection. No fevers, chills, nausea, vomiting, or diarrhea. 07/16/2021 upon evaluation today patient appears to be doing better to some degree in regard to his wound on ankle region. He actually showed some signs of new granulation growth which is actually a good thing. Fortunately there does not appear to be any evidence of active infection and wound did not appear to be too dry upon evaluation today which is also good news. No fevers, chills, nausea, vomiting, or diarrhea. 07/23/2021 upon evaluation today patient appears to be doing a little better in regard to his wound. He has been tolerating the dressing changes without complication. Fortunately there does not appear to be any signs of active  infection which is great news. No fever chills noted we are still waiting on the results back from the TheraSkin investigation to see whether or not he qualifies for this treatment option. 07/30/2021 upon evaluation today patient appears to be doing a little better again in regard to his wound. He is showing signs of improvement which is great news. I am very pleased with where things stand. I do believe that honestly all things considered he is making good progress but were just not able to do anything as far as advanced modalities to speed this up as insurance has literally devout denied everything including a snap VAC, Apligraf, PuraPly, and TheraSkin. Again I am not really certain what else to do otherwise other than just try to continue along the path we are as far as getting this wound to heal. 08/06/2021 upon evaluation today patient appears to be doing well currently he is of peering to make progress slowly but nonetheless this is at least making progress at this point. There does not appear to be any signs of active infection at this time. No fevers, chills, nausea, vomiting, or diarrhea. With that being said I do think that we are headed in the right direction with what I am seeing currently. I do wish we could do the snap VAC but to be honest I think this is the best we can do with the collagen as it stands currently. 08/13/2021 upon evaluation today patient unfortunately appears to be doing a little bit more poorly in regard to the fact that he is having some pain on the side of his ankle. This does have me a little bit concerned this is on the edge of the wound. There is a very hard area under this that I presume to be more of a calcification although I cannot be certain this is not just a superficial bony protrusion. I do feel it in the base of the wound but again I assumed more calcification due to inflammation from prior surgery but again that may not actually be the case. I do think  that we probably need to go forward with an x-ray at this point to see if there is anything going on that could be a more significant issue here. Depending on the results of the x-ray we may be looking at an MRI as well. 08/17/2021 upon evaluation today patient appears to be doing well with regard to his ankle ulcer. This actually is showing some signs of improvement which is great news and  overall very pleased in that regard. Fortunately there does not appear to be any signs of active infection which is great news as well. No fevers, chills, nausea, vomiting, or diarrhea. With that being said the x-ray showed some surgical abnormalities and along the lateral ankle as well there was some mention of an irregularity. With that being said there was nothing specifically to indicate osteomyelitis as a probable diagnosis here. With that being said I did discuss this with the patient and his wife today their opinion is to hold off on an MRI at this point especially since his wound seems to be showing signs of healing. Fortunately there is no evidence of active infection systemically at this point. 09/03/2021 upon evaluation today patient appears to be doing well with regard to his wound. He has been tolerating the dressing changes without complication. Fortunately there does not appear to be any signs of active infection which is also great news. In general I am very happy with where we stand. 09/17/2021 upon evaluation today patient appears to be doing well currently in regard to his wound. He has been tolerating the dressing changes without complication. Fortunately there does not appear to be any evidence of active infection which is great news. No fevers, chills, nausea, [name], [name] ([id number]) vomiting, or diarrhea. 10/01/2021 upon evaluation today patient appears to be doing well with regard to his ankle ulcer. He has been tolerating the dressing changes without complication. Fortunately there  does not appear to be any signs of infection which is great and overall I am extremely pleased with where we stand today. No fevers, chills, nausea, vomiting, or diarrhea. 10/22/2021 upon evaluation today patient appears to be doing well with regard to his wound on the ankle region. Fortunately there does not appear to be any signs of active infection at this time. I think that he has made significant improvement since he was last here and this is great news. 11/12/2021 upon evaluation today patient appears to be doing very well in regard to his wound. In fact this appears to be very close to complete resolution which is great news. I think that he has come a very long way in the time that we have been seeing him. His wife is taking great care of this. 12/03/2021 upon evaluation today patient appears to be doing okay in regard to his wound although its not quite as good as I was hoping for. He still has significant issues here with some drainage and there is a small opening towards the ankle region that has me mostly concerned at this point. I discussed this with the patient and his wife today. She helps take care of them. Fortunately there does not appear to be any signs of active infection locally nor systemically at this point. No fevers, chills, nausea, vomiting, or diarrhea. 12/30; patient with a traumatic wound on the right lateral malleolus in the setting of scar tissue from a remote injury and surgery in this area. He has been using silver collagen changing 3 times a week but moistening this only with water/distilled water. He came in with quite a bit of eschar on the wound surface 01/07/2022 upon evaluation today patient appears to be doing well with regard to his wound. In fact this is mostly skin covered there is just a very small sinus tract which actually extends down to the area that appears to be bone on the lateral aspect of the ankle. There really is not much of a space  here other  than for me to get a small skinny probe it and only goes down about 3 mm. Nonetheless this is where it seems to be draining from only and this is the 1 area that has been of concern to be all along. Previously we discussed an MRI but the patient and his wife did not want to do that at that time. Currently they are inquiring as to whether or not we could try an antibiotic and see if that would help to get this last little placed to seal up I can definitely give this a shot although I am not certain it is can make the improvement overall that we want to see here or not. 01/28/2022 upon evaluation today patient appears to be doing well with regard to his wound all things considered. This is actually showing signs of good improvement in my opinion I am very pleased in that regard. I do not see any signs of active infection which is great news. 02/18/2022 upon evaluation today patient unfortunately really did not show any signs of improvement even compared to when I saw him last. We really are doing about the same compared to then. Nonetheless my biggest concern at this point is simply the fact that the patient is not showing signs of significant improvement he did seem to get better while on the doxycycline but it did not completely clear. We discussed the possibility of going forward with an MRI although he is really not interested in that currently. This is something I discussed with him several times in the past. With that being said his injury is so old that he feels like that really would not affect this at this point. Nonetheless with the recent open wound that he had I feel like that is where the potential for infection would have come from. Objective Constitutional patient is hypotensive.. Well-nourished and well-hydrated in no acute distress. Vitals Time Taken: 2:29 PM, Height: 66 in, Weight: 161 lbs, BMI: 26, Temperature: 98.4 F, Pulse: 75 bpm, Respiratory Rate: 18 breaths/min, Blood Pressure:  167/93 mmHg. Respiratory normal breathing without difficulty. Psychiatric this patient is able to make decisions and demonstrates good insight into disease process. Alert and Oriented x 3. pleasant and cooperative. General Notes: Upon inspection patient's wound bed actually showed signs of good granulation and epithelization for the most part. There is just a very small pinpoint opening unfortunately this is not even enough for me to be able to obtain a culture. That would be ideal if we could identify what bacteria was causing the problem here. Unfortunately that is just not the case. For that reason I may try 1 more antibiotic to see if this could be beneficial for him. He is in agreement with the plan. Integumentary (Hair, Skin) Wound #1 status is Open. Original cause of wound was Trauma. The date acquired was: 05/09/2021. The wound has been in treatment 35 weeks. The wound is located on the Right,Lateral Ankle. The wound measures 0.1cm length x 0.1cm width x 0.2cm depth; 0.008cm^2 area and 0.002cm^3 volume. There is no tunneling or undermining noted. There is a medium amount of serosanguineous drainage noted. There is large (67-100%) pink granulation within the wound bed. There is a small (1-33%) amount of necrotic tissue within the wound bed including Adherent Slough. [name], [name] ([id number]) Assessment Active Problems ICD-10 Laceration without foreign body, right ankle, initial encounter Non-pressure chronic ulcer of right ankle with fat layer exposed Essential (primary) hypertension Plan Follow-up Appointments: Return Appointment  in 3 weeks. - per patient request Bathing/ Shower/ Hygiene: May shower; gently cleanse wound with antibacterial soap, rinse and pat dry prior to dressing wounds Edema Control - Lymphedema / Segmental Compressive Device / Other: Elevate, Exercise Daily and Avoid Standing for Long Periods of Time. Elevate legs to the level of the heart and pump ankles  as often as possible Elevate leg(s) parallel to the floor when sitting. Additional Orders / Instructions: Other: - Out of work today, return tomorrow. The following medication(s) was prescribed: Levaquin oral 500 mg tablet 1 1 tablet oral taken 1 time per day for 21 days starting 02/18/2022 WOUND #1: - Ankle Wound Laterality: Right, Lateral Cleanser: Soap and Water 3 x Per Day/30 Days Discharge Instructions: Gently cleanse wound with antibacterial soap, rinse and pat dry prior to dressing wounds Topical: Mupirocin Ointment (Generic) 3 x Per Day/30 Days Discharge Instructions: Apply a small dab to the open wound area then cover with alginate per below Primary Dressing: Silvercel Small 2x2 (in/in) 3 x Per Day/30 Days Discharge Instructions: Apply Silvercel Small 2x2 (in/in) as instructed Secondary Dressing: Zetuvit Plus Silicone Border Dressing 4x4 (in/in) 3 x Per Day/30 Days 1. We will go ahead and send in a prescription for Levaquin today. That was discussed with the patient and his wife. Unfortunately I definitely think that if this is not working that the neck step is going to be for [place] to go forward with a MRI. I doubt that something that he has been somewhat resistant to and even now really does not want to proceed with that though I explained that having a wound this small that in 3 weeks has not closed is not a normal scenario. They did listen to me although I am not certain that he was really convinced. Nonetheless in the end I am good recommend that we going to continue with the mupirocin and will also be using the Levaquin to see how things do over the next several weeks. 2. I am also can recommend at this time that we have the patient continue to monitor for any signs of worsening or infection if any redness or irritation appears he should let me know soon as possible. Otherwise the biggest issue that I see is simply that we probably need to proceed with an MRI if indeed he does not  respond to the current antibiotic regimen that I will start him on. Again this may mean that even hyperbarics could be a possibility for him. I am not sure whether he would want to do this or not but in the end it would definitely be an option to try to get this healed. We will see patient back for reevaluation in 3 weeks here in the clinic. If anything worsens or changes patient will contact our office for additional recommendations. Electronic Signature(s) Signed: 02/18/2022 3:00:29 PM By: Worthy Keeler PA-C Entered By: Worthy Keeler on 02/18/2022 15:00:29 [name] ([id number]) -------------------------------------------------------------------------------- SuperBill Details Patient Name: [name] Date of Service: 02/18/2022 Medical Record Number: [id number] Patient Account Number: [id number] Date of Birth/Sex: [birth year] (61 y.o. M) Treating RN: Carlene Coria Primary Care Provider: Halina Maidens Other Clinician: Referring Provider: Halina Maidens Treating Provider/Extender: Skipper Cliche in Treatment: 35 Diagnosis Coding ICD-10 Codes Code Description [phone] Laceration without foreign body, right ankle, initial encounter L97.312 Non-pressure chronic ulcer of right ankle with fat layer exposed Clifford (primary) hypertension Facility Procedures CPT4 Code: 44315400 Description: [phone] - WOUND CARE VISIT-LEV 2 EST PT Modifier:  Quantity: 1 Physician Procedures CPT4 Code: 9233007 Description: 62263 - WC PHYS LEVEL 4 - EST PT Modifier: Quantity: 1 CPT4 Code: Description: ICD-10 Diagnosis Description F35.456Y Laceration without foreign body, right ankle, initial encounter B63.893 Non-pressure chronic ulcer of right ankle with fat layer exposed I10 Essential (primary) hypertension Modifier: Quantity: Electronic Signature(s) Signed: 02/18/2022 3:01:09 PM By: Worthy Keeler PA-C Entered By: Worthy Keeler on 02/18/2022 15:01:09

## 2022-02-18 NOTE — Progress Notes (Addendum)
[name], [name] ([id number]) Visit Report for 02/18/2022 Arrival Information Details Patient Name: [name], [name] Date of Service: 02/18/2022 2:00 PM Medical Record Number: [id number] Patient Account Number: [id number] Date of Birth/Sex: 1961-09-24 (61 y.o. M) Treating RN: Carlene Coria Primary Care Burdett Pinzon: Halina Maidens Other Clinician: Referring Matsuko Kretz: Halina Maidens Treating Annaliesa Blann/Extender: Skipper Cliche in Treatment: 74 Visit Information History Since Last Visit All ordered tests and consults were completed: No Patient Arrived: Ambulatory Added or deleted any medications: No Arrival Time: 14:25 Any new allergies or adverse reactions: No Accompanied By: self Had a fall or experienced change in No Transfer Assistance: None activities of daily living that may affect Patient Identification Verified: Yes risk of falls: Secondary Verification Process Completed: Yes Signs or symptoms of abuse/neglect since last visito No Patient Requires Transmission-Based Precautions: No Hospitalized since last visit: No Patient Has Alerts: Yes Implantable device outside of the clinic excluding No Patient Alerts: NOT diabetic cellular tissue based products placed in the center since last visit: Has Dressing in Place as Prescribed: Yes Pain Present Now: No Electronic Signature(s) Signed: 02/21/2022 8:01:43 AM By: Carlene Coria RN Entered By: Carlene Coria on 02/18/2022 14:29:01 [name] ([id number]) -------------------------------------------------------------------------------- Clinic Level of Care Assessment Details Patient Name: [name] Date of Service: 02/18/2022 2:00 PM Medical Record Number: [id number] Patient Account Number: [id number] Date of Birth/Sex: [birth year] (61 y.o. M) Treating RN: Carlene Coria Primary Care Kashlynn Kundert: Halina Maidens Other Clinician: Referring Jasani Dolney: Halina Maidens Treating Syrena Burges/Extender: Skipper Cliche in Treatment:  35 Clinic Level of Care Assessment Items TOOL 4 Quantity Score X - Use when only an EandM is performed on FOLLOW-UP visit 1 0 ASSESSMENTS - Nursing Assessment / Reassessment X - Reassessment of Co-morbidities (includes updates in patient status) 1 10 X- 1 5 Reassessment of Adherence to Treatment Plan ASSESSMENTS - Wound and Skin Assessment / Reassessment X - Simple Wound Assessment / Reassessment - one wound 1 5 _0  - 0 Complex Wound Assessment / Reassessment - multiple wounds _1  - 0 Dermatologic / Skin Assessment (not related to wound area) ASSESSMENTS - Focused Assessment _2  - Circumferential Edema Measurements - multi extremities 0 _3  - 0 Nutritional Assessment / Counseling / Intervention _4  - 0 Lower Extremity Assessment (monofilament, tuning fork, pulses) _5  - 0 Peripheral Arterial Disease Assessment (using hand held doppler) ASSESSMENTS - Ostomy and/or Continence Assessment and Care _6  - Incontinence Assessment and Management 0 _7  - 0 Ostomy Care Assessment and Management (repouching, etc.) PROCESS - Coordination of Care X - Simple Patient / Family Education for ongoing care 1 15 _8  - 0 Complex (extensive) Patient / Family Education for ongoing care _9  - 0 Staff obtains Programmer, systems, Records, Test Results / Process Orders _10  - 0 Staff telephones HHA, Nursing Homes / Clarify orders / etc _11  - 0 Routine Transfer to another Facility (non-emergent condition) _12  - 0 Routine Hospital Admission (non-emergent condition) _13  - 0 New Admissions / Biomedical engineer / Ordering NPWT, Apligraf, etc. _14  - 0 Emergency Hospital Admission (emergent condition) X- 1 10 Simple Discharge Coordination _15  - 0 Complex (extensive) Discharge Coordination PROCESS - Special Needs _16  - Pediatric / Minor Patient Management 0 _17  - 0 Isolation Patient Management _18  - 0 Hearing / Language / Visual special needs _19  - 0 Assessment of Community assistance (transportation, D/C planning,  etc.) _20  - 0 Additional assistance / Altered mentation _21  - 0 Support Surface(s) Assessment (bed, cushion, seat, etc.) INTERVENTIONS - Wound Cleansing / Measurement [name], [name]. ([id number]) X- 1 5  Simple Wound Cleansing - one wound _0  - 0 Complex Wound Cleansing - multiple wounds X- 1 5 Wound Imaging (photographs - any number of wounds) _1  - 0 Wound Tracing (instead of photographs) X- 1 5 Simple Wound Measurement - one wound _2  - 0 Complex Wound Measurement - multiple wounds INTERVENTIONS - Wound Dressings X - Small Wound Dressing one or multiple wounds 1 10 _3  - 0 Medium Wound Dressing one or multiple wounds _4  - 0 Large Wound Dressing one or multiple wounds <KDTOIZTIWPYKDXIP>_3<\/ASNKNLZJQBHALPFX>_9  - 0 Application of Medications - topical <KWIOXBDZHGDJMEQA>_8<\/TMHDQQIWLNLGXQJJ>_9  - 0 Application of Medications - injection INTERVENTIONS - Miscellaneous _7  - External ear exam 0 _8  - 0 Specimen Collection (cultures, biopsies, blood, body fluids, etc.) _9  - 0 Specimen(s) / Culture(s) sent or taken to Lab for analysis _10  - 0 Patient Transfer (multiple staff / Civil Service fast streamer / Similar devices) _11  - 0 Simple Staple / Suture removal (25 or less) _12  - 0 Complex Staple / Suture removal (26 or more) _13  - 0 Hypo / Hyperglycemic Management (close monitor of Blood Glucose) _14  - 0 Ankle / Brachial Index (ABI) - do not check if billed separately X- 1 5 Vital Signs Has the patient been seen at the hospital within the last three years: Yes Total Score: 75 Level Of Care: New/Established - Level 2 Electronic Signature(s) Signed: 02/21/2022 8:01:43 AM By: Carlene Coria RN Entered By: Carlene Coria on 02/18/2022 14:54:35 [name] ([id number]) -------------------------------------------------------------------------------- Encounter Discharge Information Details Patient Name: [name] Date of Service: 02/18/2022 2:00 PM Medical Record Number: [id number] Patient Account Number: [id number] Date of Birth/Sex: 1961-11-05 (61 y.o. M) Treating  RN: Carlene Coria Primary Care Iasiah Ozment: Halina Maidens Other Clinician: Referring Donabelle Molden: Halina Maidens Treating Darnette Lampron/Extender: Skipper Cliche in Treatment: 35 Encounter Discharge Information Items Discharge Condition: Stable Ambulatory Status: Ambulatory Discharge Destination: Home Transportation: Private Auto Accompanied By: wife Schedule Follow-up Appointment: Yes Clinical Summary of Care: Electronic Signature(s) Signed: 02/21/2022 8:01:43 AM By: Carlene Coria RN Entered By: Carlene Coria on 02/18/2022 [place], [place]. ([id number]) -------------------------------------------------------------------------------- Lower Extremity Assessment Details Patient Name: [name] Date of Service: 02/18/2022 2:00 PM Medical Record Number: [id number] Patient Account Number: [id number] Date of Birth/Sex: 03-Apr-1961 (62 y.o. M) Treating RN: Carlene Coria Primary Care Meher Kucinski: Halina Maidens Other Clinician: Referring Refugia Laneve: Halina Maidens Treating Malya Cirillo/Extender: Jeri Cos Weeks in Treatment: 35 Electronic Signature(s) Signed: 02/21/2022 8:01:43 AM By: Carlene Coria RN Entered By: Carlene Coria on 02/18/2022 14:37:41 [name] ([id number]) -------------------------------------------------------------------------------- Multi Wound Chart Details Patient Name: [name] Date of Service: 02/18/2022 2:00 PM Medical Record Number: [id number] Patient Account Number: [id number] Date of Birth/Sex: Jun 18, 1961 (61 y.o. M) Treating RN: Carlene Coria Primary Care Yaffa Seckman: Halina Maidens Other Clinician: Referring Pawan Knechtel: Halina Maidens Treating Lonita Debes/Extender: Skipper Cliche in Treatment: 35 Vital Signs Height(in): 66 Pulse(bpm): 75 Weight(lbs): 161 Blood Pressure(mmHg): 167/93 Body Mass Index(BMI): 26 Temperature(F): 98.4 Respiratory Rate(breaths/min): 18 Photos: [N/A:N/A] Wound Location: Right, Lateral Ankle N/A N/A Wounding  Event: Trauma N/A N/A Primary Etiology: Trauma, Other N/A N/A Comorbid History: Hypertension N/A N/A Date Acquired: 05/09/2021 N/A N/A Weeks of Treatment: 35 N/A N/A Wound Status: Open N/A N/A Wound Recurrence: No N/A N/A Measurements L x W x D (cm) 0.1x0.1x0.2 N/A N/A Area (cm) : 0.008 N/A N/A Volume (cm) : 0.002 N/A N/A % Reduction in Area: 99.00% N/A N/A % Reduction in Volume: 99.50% N/A N/A Classification: Full Thickness Without Exposed N/A N/A Support Structures Exudate Amount: Medium N/A N/A Exudate Type: Serosanguineous  N/A N/A Exudate Color: red, brown N/A N/A Granulation Amount: Large (67-100%) N/A N/A Granulation Quality: Pink N/A N/A Necrotic Amount: Small (1-33%) N/A N/A Exposed Structures: Fascia: No N/A N/A Fat Layer (Subcutaneous Tissue): No Tendon: No Muscle: No Joint: No Bone: No Epithelialization: None N/A N/A Treatment Notes Electronic Signature(s) Signed: 02/21/2022 8:01:43 AM By: Carlene Coria RN Entered By: Carlene Coria on 02/18/2022 14:38:30 [name] ([id number]) -------------------------------------------------------------------------------- [place] Details Patient Name: [name] Date of Service: 02/18/2022 2:00 PM Medical Record Number: [id number] Patient Account Number: [id number] Date of Birth/Sex: [birth year] (62 y.o. M) Treating RN: Carlene Coria Primary Care Matison Nuccio: Halina Maidens Other Clinician: Referring Kam Kushnir: Halina Maidens Treating Kaija Kovacevic/Extender: Skipper Cliche in Treatment: 35 Active Inactive Wound/Skin Impairment Nursing Diagnoses: Knowledge deficit related to ulceration/compromised skin integrity Goals: Patient/caregiver will verbalize understanding of skin care regimen Date Initiated: 06/18/2021 Target Resolution Date: 03/18/2022 Goal Status: Active Ulcer/skin breakdown will have a volume reduction of 30% by week 4 Date Initiated: 06/18/2021 Date Inactivated: 07/16/2021 Target  Resolution Date: 07/18/2021 Goal Status: Unmet Unmet Reason: comorbities Ulcer/skin breakdown will have a volume reduction of 50% by week 8 Date Initiated: 06/18/2021 Date Inactivated: 08/20/2021 Target Resolution Date: 08/18/2021 Goal Status: Unmet Unmet Reason: comorbites Ulcer/skin breakdown will have a volume reduction of 80% by week 12 Date Initiated: 06/18/2021 Date Inactivated: 10/01/2021 Target Resolution Date: 09/18/2021 Goal Status: Met Ulcer/skin breakdown will heal within 14 weeks Date Initiated: 06/18/2021 Date Inactivated: 02/18/2022 Target Resolution Date: 10/18/2021 Goal Status: Unmet Unmet Reason: comobities Interventions: Assess patient/caregiver ability to obtain necessary supplies Assess patient/caregiver ability to perform ulcer/skin care regimen upon admission and as needed Assess ulceration(s) every visit Notes: Electronic Signature(s) Signed: 02/21/2022 8:01:43 AM By: Carlene Coria RN Entered By: Carlene Coria on 02/18/2022 14:38:17 [name] ([id number]) -------------------------------------------------------------------------------- Pain Assessment Details Patient Name: [name] Date of Service: 02/18/2022 2:00 PM Medical Record Number: [id number] Patient Account Number: [id number] Date of Birth/Sex: 02-06-1961 (61 y.o. M) Treating RN: Carlene Coria Primary Care Chidera Thivierge: Halina Maidens Other Clinician: Referring Zakira Ressel: Halina Maidens Treating Ifeanyichukwu Wickham/Extender: Skipper Cliche in Treatment: 35 Active Problems Location of Pain Severity and Description of Pain Patient Has Paino No Site Locations Pain Management and Medication Current Pain Management: Electronic Signature(s) Signed: 02/21/2022 8:01:43 AM By: Carlene Coria RN Entered By: Carlene Coria on 02/18/2022 14:29:31 [name] ([id number]) -------------------------------------------------------------------------------- Patient/Caregiver Education Details Patient Name:  [name] Date of Service: 02/18/2022 2:00 PM Medical Record Number: [id number] Patient Account Number: [id number] Date of Birth/Gender: 1961/09/10 (61 y.o. M) Treating RN: Carlene Coria Primary Care Physician: Halina Maidens Other Clinician: Referring Physician: Halina Maidens Treating Physician/Extender: Skipper Cliche in Treatment: 47 Education Assessment Education Provided To: Patient Education Topics Provided Wound/Skin Impairment: Methods: Explain/Verbal Responses: State content correctly Electronic Signature(s) Signed: 02/21/2022 8:01:43 AM By: Carlene Coria RN Entered By: Carlene Coria on 02/18/2022 14:54:51 [name] ([id number]) -------------------------------------------------------------------------------- Wound Assessment Details Patient Name: [name] Date of Service: 02/18/2022 2:00 PM Medical Record Number: [id number] Patient Account Number: [id number] Date of Birth/Sex: [birth year] (61 y.o. M) Treating RN: Carlene Coria Primary Care Ferdinando Lodge: Halina Maidens Other Clinician: Referring Giovannina Mun: Halina Maidens Treating Bentlie Withem/Extender: Jeri Cos Weeks in Treatment: 35 Wound Status Wound Number: 1 Primary Etiology: Trauma, Other Wound Location: Right, Lateral Ankle Wound Status: Open Wounding Event: Trauma Comorbid History: Hypertension Date Acquired: 05/09/2021 Weeks Of Treatment: 35 Clustered Wound: No Photos Wound Measurements Length: (cm) 0.1 Width: (cm) 0.1 Depth: (cm) 0.2 Area: (cm) 0.008 Volume: (  cm) 0.002 % Reduction in Area: 99% % Reduction in Volume: 99.5% Epithelialization: None Tunneling: No Undermining: No Wound Description Classification: Full Thickness Without Exposed Support Structures Exudate Amount: Medium Exudate Type: Serosanguineous Exudate Color: red, brown Foul Odor After Cleansing: No Slough/Fibrino Yes Wound Bed Granulation Amount: Large (67-100%) Exposed Structure Granulation Quality:  Pink Fascia Exposed: No Necrotic Amount: Small (1-33%) Fat Layer (Subcutaneous Tissue) Exposed: No Necrotic Quality: Adherent Slough Tendon Exposed: No Muscle Exposed: No Joint Exposed: No Bone Exposed: No Treatment Notes Wound #1 (Ankle) Wound Laterality: Right, Lateral Cleanser Soap and Water Discharge Instruction: Gently cleanse wound with antibacterial soap, rinse and pat dry prior to dressing wounds Peri-Wound Care [name], [name] ([id number]) Topical Mupirocin Ointment Discharge Instruction: Apply a small dab to the open wound area then cover with alginate per below Primary Dressing Silvercel Small 2x2 (in/in) Discharge Instruction: Apply Silvercel Small 2x2 (in/in) as instructed Secondary Dressing Zetuvit Plus Silicone Border Dressing 4x4 (in/in) Secured With Compression Wrap Compression Stockings Add-Ons Electronic Signature(s) Signed: 02/21/2022 8:01:43 AM By: Carlene Coria RN Entered By: Carlene Coria on 02/18/2022 14:37:18 [name] ([id number]) -------------------------------------------------------------------------------- Vitals Details Patient Name: [name] Date of Service: 02/18/2022 2:00 PM Medical Record Number: [id number] Patient Account Number: [id number] Date of Birth/Sex: February 10, 1961 (61 y.o. M) Treating RN: Carlene Coria Primary Care Jaiona Simien: Halina Maidens Other Clinician: Referring Braxon Suder: Halina Maidens Treating Izzak Fries/Extender: Skipper Cliche in Treatment: 35 Vital Signs Time Taken: 14:29 Temperature (F): 98.4 Height (in): 66 Pulse (bpm): 75 Weight (lbs): 161 Respiratory Rate (breaths/min): 18 Body Mass Index (BMI): 26 Blood Pressure (mmHg): 167/93 Reference Range: 80 - 120 mg / dl Electronic Signature(s) Signed: 02/21/2022 8:01:43 AM By: Carlene Coria RN Entered By: Carlene Coria on 02/18/2022 14:29:21

## 2022-03-04 ENCOUNTER — Encounter: Payer: Self-pay | Admitting: Internal Medicine

## 2022-03-04 ENCOUNTER — Other Ambulatory Visit: Payer: Self-pay

## 2022-03-04 ENCOUNTER — Ambulatory Visit (INDEPENDENT_AMBULATORY_CARE_PROVIDER_SITE_OTHER): Payer: 59 | Admitting: Internal Medicine

## 2022-03-04 VITALS — BP 118/78 | HR 67 | Ht 66.0 in | Wt 165.4 lb

## 2022-03-04 DIAGNOSIS — R7303 Prediabetes: Secondary | ICD-10-CM | POA: Diagnosis not present

## 2022-03-04 DIAGNOSIS — C61 Malignant neoplasm of prostate: Secondary | ICD-10-CM | POA: Diagnosis not present

## 2022-03-04 DIAGNOSIS — R7989 Other specified abnormal findings of blood chemistry: Secondary | ICD-10-CM

## 2022-03-04 DIAGNOSIS — I10 Essential (primary) hypertension: Secondary | ICD-10-CM

## 2022-03-04 DIAGNOSIS — E782 Mixed hyperlipidemia: Secondary | ICD-10-CM | POA: Diagnosis not present

## 2022-03-04 DIAGNOSIS — S81801A Unspecified open wound, right lower leg, initial encounter: Secondary | ICD-10-CM | POA: Insufficient documentation

## 2022-03-04 NOTE — Progress Notes (Signed)
? ? ?Date:  03/04/2022  ? ?Name:  [name]   DOB:  01/12/1961   MRN:  [id number] ? ? ?Chief Complaint: Diabetes and Hypertension ? ?Hypertension ?This is a chronic problem. The problem is controlled. Pertinent negatives include no chest pain, headaches, palpitations or shortness of breath. Past treatments include angiotensin blockers. The current treatment provides significant improvement. There are no compliance problems.   ?Diabetes ?He presents for his follow-up diabetic visit. Diabetes type: prediabetes. His disease course has been improving. Pertinent negatives for hypoglycemia include no headaches or tremors. Pertinent negatives for diabetes include no chest pain, no fatigue, no polydipsia and no polyuria. Current diabetic treatment includes diet (he has cut out soda and trying to limit other carbs). He is compliant with treatment most of the time.  ?Prostate cancer - never returned to Urology for repeat biopsy.  Last PSA was slightly lower.  Gleason score 3+3=6 25%.  He says he feels well.  He is very reluctant to return to Urology. ? ?Lab Results  ?Component Value Date  ? PSA1 5.0 (H) 10/29/2021  ? PSA1 6.3 (H) 06/22/2020  ? PSA1 4.2 (H) 10/16/2019  ? ? ? ?Lab Results  ?Component Value Date  ? NA 142 10/29/2021  ? K 4.7 10/29/2021  ? CO2 24 10/29/2021  ? GLUCOSE 104 (H) 10/29/2021  ? BUN 16 10/29/2021  ? CREATININE 1.06 10/29/2021  ? CALCIUM 9.7 10/29/2021  ? EGFR 79 10/29/2021  ? GFRNONAA 76 10/23/2020  ? ?Lab Results  ?Component Value Date  ? CHOL 188 10/29/2021  ? HDL 43 10/29/2021  ? LDLCALC 119 (H) 10/29/2021  ? TRIG 148 10/29/2021  ? CHOLHDL 4.4 10/29/2021  ? ?Lab Results  ?Component Value Date  ? TSH 26.900 (H) 10/23/2020  ? ?Lab Results  ?Component Value Date  ? HGBA1C 6.5 (H) 10/29/2021  ? ?Lab Results  ?Component Value Date  ? WBC 8.0 10/29/2021  ? HGB 15.6 10/29/2021  ? HCT 46.2 10/29/2021  ? MCV 88 10/29/2021  ? PLT 261 10/29/2021  ? ?Lab Results  ?Component Value Date  ? ALT 41 10/29/2021   ? AST 29 10/29/2021  ? ALKPHOS 64 10/29/2021  ? BILITOT 0.5 10/29/2021  ? ?No results found for: 25OHVITD2, 25OHVITD3, VD25OH  ? ?Review of Systems  ?Constitutional:  Negative for appetite change, fatigue and unexpected weight change.  ?Eyes:  Negative for visual disturbance.  ?Respiratory:  Negative for cough, shortness of breath and wheezing.   ?Cardiovascular:  Negative for chest pain, palpitations and leg swelling.  ?Gastrointestinal:  Negative for abdominal pain and blood in stool.  ?Endocrine: Negative for polydipsia and polyuria.  ?Genitourinary:  Negative for dysuria and hematuria.  ?Skin:  Negative for color change and rash.  ?Neurological:  Negative for tremors, numbness and headaches.  ?Psychiatric/Behavioral:  Negative for dysphoric mood.   ? ?Patient Active Problem List  ? Diagnosis Date Noted  ? Prediabetes 11/04/2020  ? Carpal tunnel syndrome, bilateral 10/23/2020  ? Tobacco use disorder, severe, in sustained remission 10/16/2019  ? Gastroesophageal reflux disease 01/28/2019  ? 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 Prostate cancer (HCC) 07/27/2018  ? Hyperlipidemia, mixed 01/27/2018  ? Nocturia 10/09/2017  ? Essential hypertension 10/09/2017  ? ? ?No Known Allergies ? ?Past Surgical History:  ?Procedure Laterality Date  ? lung collapse  2013  ? ? ?Social History  ? ?Tobacco Use  ? Smoking status: Former  ?  Packs/day: 0.25  ?  Years: 40.00  ?  Pack years: 10.00  ?    Types: Cigarettes  ?  Quit date: 11/25/2017  ?  Years since quitting: 4.2  ? Smokeless tobacco: Never  ?Vaping Use  ? Vaping Use: Never used  ?Substance Use Topics  ? Alcohol use: No  ? Drug use: No  ? ? ? ?Medication list has been reviewed and updated. ? ?Current Meds  ?Medication Sig  ? atorvastatin (LIPITOR) 10 MG tablet TAKE 1 TABLET(10 MG) BY MOUTH DAILY  ? olmesartan (BENICAR) 20 MG tablet Take 1 tablet (20 mg total) by mouth daily.  ? ? ?PHQ 2/9 Scores 03/04/2022 10/29/2021 10/23/2020 10/16/2019  ?PHQ - 2 Score 0 0 0 0  ?PHQ- 9 Score 0 0  0 -  ? ? ?GAD 7 : Generalized Anxiety Score 03/04/2022 10/29/2021 10/23/2020  ?Nervous, Anxious, on Edge 0 0 0  ?Control/stop worrying 0 0 0  ?Worry too much - different things 0 0 0  ?Trouble relaxing 0 0 0  ?Restless 0 0 0  ?Easily annoyed or irritable 0 0 0  ?Afraid - awful might happen 0 0 0  ?Total GAD 7 Score 0 0 0  ?Anxiety Difficulty Not difficult at all Not difficult at all Not difficult at all  ? ? ?BP Readings from Last 3 Encounters:  ?03/04/22 118/78  ?10/29/21 122/78  ?06/12/21 140/83  ? ? ?Physical Exam ?Vitals and nursing note reviewed.  ?Constitutional:   ?   General: He is not in acute distress. ?   Appearance: He is well-developed.  ?HENT:  ?   Head: Normocephalic and atraumatic.  ?Neck:  ?   Vascular: No carotid bruit.  ?Cardiovascular:  ?   Rate and Rhythm: Normal rate and regular rhythm.  ?   Pulses: Normal pulses.  ?Pulmonary:  ?   Effort: Pulmonary effort is normal. No respiratory distress.  ?   Breath sounds: No wheezing or rhonchi.  ?Abdominal:  ?   General: Abdomen is flat.  ?   Palpations: Abdomen is soft.  ?   Tenderness: There is no abdominal tenderness.  ?Musculoskeletal:  ?   Cervical back: Normal range of motion.  ?   Right lower leg: No edema.  ?   Left lower leg: No edema.  ?Lymphadenopathy:  ?   Cervical: No cervical adenopathy.  ?Skin: ?   General: Skin is warm and dry.  ?   Findings: No rash.  ?   Comments: Wound of right ankle not inspected  ?Neurological:  ?   Mental Status: He is alert and oriented to person, place, and time.  ?Psychiatric:     ?   Mood and Affect: Mood normal.     ?   Behavior: Behavior normal.  ? ? ?Wt Readings from Last 3 Encounters:  ?03/04/22 165 lb 6.4 oz (75 kg)  ?10/29/21 162 lb (73.5 kg)  ?06/12/21 162 lb (73.5 kg)  ? ? ?BP 118/78   Pulse 67   Ht 5' 6" (1.676 m)   Wt 165 lb 6.4 oz (75 kg)   SpO2 95%   BMI 26.70 kg/m?  ? ?Assessment and Plan: ?1. Essential hypertension ?Clinically stable exam with well controlled BP. ?Tolerating medications without  side effects at this time. ?Pt to continue current regimen and low sodium diet; benefits of regular exercise as able discussed. ? ?2. Prediabetes ?Continue to limit carbs ?Will advise if medication is needed ?- Hemoglobin A1c ? ?3. Prostate cancer (HCC) ?Encouraged him to return to Urology but he declines ?Will repeat PSA and advise - if lower or   unchanged, will continue to monitor ?- PSA ? ?4. Hyperlipidemia, mixed ?On statin therapy ? ?5. Elevated TSH ?Repeat labs ?- TSH + free T4 ? ?6. Non-healing wound of right lower extremity ?Followed by wound clinic ?Currently on levofloxacin for 3 weeks ? ? ?Partially dictated using Dragon software. Any errors are unintentional. ? ?Laura Berglund, MD ?[facility] ?[facility] ? ?03/04/2022 ? ? ? ? ? ?

## 2022-03-05 LAB — HEMOGLOBIN A1C
Est. average glucose Bld gHb Est-mCnc: 140 mg/dL
Hgb A1c MFr Bld: 6.5 % — ABNORMAL HIGH (ref 4.8–5.6)

## 2022-03-05 LAB — PSA: Prostate Specific Ag, Serum: 4.8 ng/mL — ABNORMAL HIGH (ref 0.0–4.0)

## 2022-03-05 LAB — TSH+FREE T4
Free T4: 0.74 ng/dL — ABNORMAL LOW (ref 0.82–1.77)
TSH: 16.5 u[IU]/mL — ABNORMAL HIGH (ref 0.450–4.500)

## 2022-03-06 ENCOUNTER — Encounter: Payer: Self-pay | Admitting: Internal Medicine

## 2022-03-06 ENCOUNTER — Other Ambulatory Visit: Payer: Self-pay | Admitting: Internal Medicine

## 2022-03-06 DIAGNOSIS — E039 Hypothyroidism, unspecified: Secondary | ICD-10-CM

## 2022-03-06 MED ORDER — LEVOTHYROXINE SODIUM 25 MCG PO TABS: 25.0000 ug | ORAL_TABLET | Freq: Every day | ORAL | 1 refills | Status: DC

## 2022-03-18 ENCOUNTER — Encounter: Payer: 59 | Attending: Physician Assistant | Admitting: Physician Assistant

## 2022-03-18 ENCOUNTER — Other Ambulatory Visit: Payer: Self-pay

## 2022-03-18 DIAGNOSIS — X58XXXA Exposure to other specified factors, initial encounter: Secondary | ICD-10-CM | POA: Insufficient documentation

## 2022-03-18 DIAGNOSIS — L97312 Non-pressure chronic ulcer of right ankle with fat layer exposed: Secondary | ICD-10-CM | POA: Diagnosis present

## 2022-03-18 DIAGNOSIS — S91011A Laceration without foreign body, right ankle, initial encounter: Secondary | ICD-10-CM | POA: Insufficient documentation

## 2022-03-18 DIAGNOSIS — I10 Essential (primary) hypertension: Secondary | ICD-10-CM | POA: Diagnosis not present

## 2022-03-18 NOTE — Progress Notes (Addendum)
Ryan, [name] ([id number]) ?Visit Report for 03/18/2022 ?Chief Complaint Document Details ?Patient Name: [name], [name] ?Date of Service: 03/18/2022 2:00 PM ?Medical Record Number: [id number] ?Patient Account Number: [id number] ?Date of Birth/Sex: 1961/11/01 (61 y.o. M) ?Treating RN: Carlene Coria ?Primary Care Provider: Halina Maidens Other Clinician: ?Referring Provider: Halina Maidens ?Treating Provider/Extender: Jeri Cos ?Weeks in Treatment: 39 ?Information Obtained from: Patient ?Chief Complaint ?Right ankle laceration ?Electronic Signature(s) ?Signed: 03/18/2022 2:49:46 PM By: Worthy Keeler PA-C ?Entered By: Worthy Keeler on 03/18/2022 14:49:46 ?GAREY, ALLEVA ([id number]) ?-------------------------------------------------------------------------------- ?HPI Details ?Patient Name: Ryan, [name] ?Date of Service: 03/18/2022 2:00 PM ?Medical Record Number: [id number] ?Patient Account Number: [id number] ?Date of Birth/Sex: [birth year] (61 y.o. M) ?Treating RN: Carlene Coria ?Primary Care Provider: Halina Maidens Other Clinician: ?Referring Provider: Halina Maidens ?Treating Provider/Extender: Jeri Cos ?Weeks in Treatment: 39 ?History of Present Illness ?HPI Description: 06/14/2021 upon evaluation today patient appears to be doing somewhat poorly in regard to the wound on his right ankle ?region. This was an injury that apparently occurred at work according to what he is telling me today. He is seen with his wife in the office at this ?point. Nonetheless the patient appears to have a circular opening with necrotic tissue in the base of the wound. This does not appear to be doing ?nearly as good as I would hope. Fortunately there does not appear to be any signs of infection though he does have a significant amount of ?necrotic tissue this is going to take some time to heal. He also has previously had surgery in this area there is a lot of scar tissue that will again ?complicate things and extended  healing process. The patient does have hypertension otherwise no major medical problems. ?06/25/2021 upon evaluation today patient presents for reevaluation here in our clinic he actually does seem to be doing better in regard to his ?wound on the right lateral ankle. He has been tolerating the dressing changes without complication. Fortunately I do not see any signs of infection ?which is great news. ?07/02/2021 upon evaluation today patient appears to be doing about the same in regard to his wound. With that being said I do believe that this is ?cleaner there was a little bit of slough noted we have been using Iodoflex. I was able to see a little bit more granulation tissue starting to try to ?build up but I think the scar tissue here is going to be the biggest slowing factor with regard to healing. That was discussed with the patient and ?his wife today. Nonetheless I think we want to switch up things based on what I see at this point. ?07/09/2021 upon evaluation today patient appears to be doing about the same in regard to his ankle ulcer. He has been tolerating the dressing ?changes without complication. Fortunately there does not appear to be any signs of active infection. No fevers, chills, nausea, vomiting, or ?diarrhea. ?07/16/2021 upon evaluation today patient appears to be doing better to some degree in regard to his wound on ankle region. He actually showed ?some signs of new granulation growth which is actually a good thing. Fortunately there does not appear to be any evidence of active infection and ?wound did not appear to be too dry upon evaluation today which is also good news. No fevers, chills, nausea, vomiting, or diarrhea. ?07/23/2021 upon evaluation today patient appears to be doing a little better in regard to his wound. He has been tolerating the dressing changes ?without complication. Fortunately  there does not appear to be any signs of active infection which is great news. No fever chills noted we  are still ?waiting on the results back from the TheraSkin investigation to see whether or not he qualifies for this treatment option. ?07/30/2021 upon evaluation today patient appears to be doing a little better again in regard to his wound. He is showing signs of improvement ?which is great news. I am very pleased with where things stand. I do believe that honestly all things considered he is making good progress but ?were just not able to do anything as far as advanced modalities to speed this up as insurance has literally devout denied everything including a ?snap VAC, Apligraf, PuraPly, and TheraSkin. Again I am not really certain what else to do otherwise other than just try to continue along the path ?we are as far as getting this wound to heal. ?08/06/2021 upon evaluation today patient appears to be doing well currently he is of peering to make progress slowly but nonetheless this is at ?least making progress at this point. There does not appear to be any signs of active infection at this time. No fevers, chills, nausea, vomiting, or ?diarrhea. With that being said I do think that we are headed in the right direction with what I am seeing currently. I do wish we could do the snap ?VAC but to be honest I think this is the best we can do with the collagen as it stands currently. ?08/13/2021 upon evaluation today patient unfortunately appears to be doing a little bit more poorly in regard to the fact that he is having some ?pain on the side of his ankle. This does have me a little bit concerned this is on the edge of the wound. There is a very hard area under this that I ?presume to be more of a calcification although I cannot be certain this is not just a superficial bony protrusion. I do feel it in the base of the ?wound but again I assumed more calcification due to inflammation from prior surgery but again that may not actually be the case. I do think that ?we probably need to go forward with an x-ray at this  point to see if there is anything going on that could be a more significant issue here. ?Depending on the results of the x-ray we may be looking at an MRI as well. ?08/17/2021 upon evaluation today patient appears to be doing well with regard to his ankle ulcer. This actually is showing some signs of ?improvement which is great news and overall very pleased in that regard. Fortunately there does not appear to be any signs of active infection ?which is great news as well. No fevers, chills, nausea, vomiting, or diarrhea. With that being said the x-ray showed some surgical abnormalities ?and along the lateral ankle as well there was some mention of an irregularity. With that being said there was nothing specifically to indicate ?osteomyelitis as a probable diagnosis here. With that being said I did discuss this with the patient and his wife today their opinion is to hold off on ?an MRI at this point especially since his wound seems to be showing signs of healing. Fortunately there is no evidence of active infection ?systemically at this point. ?09/03/2021 upon evaluation today patient appears to be doing well with regard to his wound. He has been tolerating the dressing changes without ?complication. Fortunately there does not appear to be any signs of active infection which  is also great news. In general I am very happy with ?where we stand. ?09/17/2021 upon evaluation today patient appears to be doing well currently in regard to his wound. He has been tolerating the dressing changes ?without complication. Fortunately there does not appear to be any evidence of active infection which is great news. No fevers, chills, nausea, ?vomiting, or diarrhea. ?10/01/2021 upon evaluation today patient appears to be doing well with regard to his ankle ulcer. He has been tolerating the dressing changes ?without complication. Fortunately there does not appear to be any signs of infection which is great and overall I am extremely pleased  with where ?we stand today. No fevers, chills, nausea, vomiting, or diarrhea. PARTHIV, MUCCI ([id number]) ?10/22/2021 upon evaluation today patient appears to be doing well with regard to his wound

## 2022-03-22 NOTE — Progress Notes (Addendum)
[name], [name] ([id number]) ?Visit Report for 03/18/2022 ?Arrival Information Details ?Patient Name: [name], [name] ?Date of Service: 03/18/2022 2:00 PM ?Medical Record Number: [id number] ?Patient Account Number: [id number] ?Date of Birth/Sex: [birth year] (61 y.o. M) ?Treating RN: Carlene Coria ?Primary Care Chick Cousins: Halina Maidens Other Clinician: ?Referring Valley Ke: Halina Maidens ?Treating Cain Fitzhenry/Extender: Jeri Cos ?Weeks in Treatment: 39 ?Visit Information History Since Last Visit ?All ordered tests and consults were completed: No ?Patient Arrived: Ambulatory ?Added or deleted any medications: No ?Arrival Time: 13:52 ?Any new allergies or adverse reactions: No ?Accompanied By: wife ?Had a fall or experienced change in No ?Transfer Assistance: None ?activities of daily living that may affect ?Patient Identification Verified: Yes ?risk of falls: ?Secondary Verification Process Completed: Yes ?Signs or symptoms of abuse/neglect since last visito No ?Patient Requires Transmission-Based Precautions: No ?Hospitalized since last visit: No ?Patient Has Alerts: Yes ?Implantable device outside of the clinic excluding No ?Patient Alerts: NOT diabetic ?cellular tissue based products placed in the center ?since last visit: ?Has Dressing in Place as Prescribed: Yes ?Pain Present Now: No ?Electronic Signature(s) ?Signed: 03/22/2022 4:01:37 PM By: Carlene Coria RN ?Entered By: Carlene Coria on 03/18/2022 13:56:09 ?[name], [name] ([id number]) ?-------------------------------------------------------------------------------- ?Clinic Level of Care Assessment Details ?Patient Name: [name], [name] ?Date of Service: 03/18/2022 2:00 PM ?Medical Record Number: [id number] ?Patient Account Number: [id number] ?Date of Birth/Sex: [birth year] (61 y.o. M) ?Treating RN: Carlene Coria ?Primary Care Kymoni Lesperance: Halina Maidens Other Clinician: ?Referring Wren Pryce: Halina Maidens ?Treating Jadarion Halbig/Extender: Jeri Cos ?Weeks in Treatment:  39 ?Clinic Level of Care Assessment Items ?TOOL 4 Quantity Score ?X - Use when only an EandM is performed on FOLLOW-UP visit 1 0 ?ASSESSMENTS - Nursing Assessment / Reassessment ?X - Reassessment of Co-morbidities (includes updates in patient status) 1 10 ?X- 1 5 ?Reassessment of Adherence to Treatment Plan ?ASSESSMENTS - Wound and Skin Assessment / Reassessment ?X - Simple Wound Assessment / Reassessment - one wound 1 5 ?'[]'$  - 0 ?Complex Wound Assessment / Reassessment - multiple wounds ?'[]'$  - 0 ?Dermatologic / Skin Assessment (not related to wound area) ?ASSESSMENTS - Focused Assessment ?'[]'$  - Circumferential Edema Measurements - multi extremities 0 ?'[]'$  - 0 ?Nutritional Assessment / Counseling / Intervention ?'[]'$  - 0 ?Lower Extremity Assessment (monofilament, tuning fork, pulses) ?'[]'$  - 0 ?Peripheral Arterial Disease Assessment (using hand held doppler) ?ASSESSMENTS - Ostomy and/or Continence Assessment and Care ?'[]'$  - Incontinence Assessment and Management 0 ?'[]'$  - 0 ?Ostomy Care Assessment and Management (repouching, etc.) ?PROCESS - Coordination of Care ?X - Simple Patient / Family Education for ongoing care 1 15 ?'[]'$  - 0 ?Complex (extensive) Patient / Family Education for ongoing care ?'[]'$  - 0 ?Staff obtains Consents, Records, Test Results / Process Orders ?'[]'$  - 0 ?Staff telephones HHA, Nursing Homes / Clarify orders / etc ?'[]'$  - 0 ?Routine Transfer to another Facility (non-emergent condition) ?'[]'$  - 0 ?Routine Hospital Admission (non-emergent condition) ?'[]'$  - 0 ?New Admissions / Biomedical engineer / Ordering NPWT, Apligraf, etc. ?'[]'$  - 0 ?Emergency Hospital Admission (emergent condition) ?X- 1 10 ?Simple Discharge Coordination ?'[]'$  - 0 ?Complex (extensive) Discharge Coordination ?PROCESS - Special Needs ?'[]'$  - Pediatric / Minor Patient Management 0 ?'[]'$  - 0 ?Isolation Patient Management ?'[]'$  - 0 ?Hearing / Language / Visual special needs ?'[]'$  - 0 ?Assessment of Community assistance (transportation, D/C planning,  etc.) ?'[]'$  - 0 ?Additional assistance / Altered mentation ?'[]'$  - 0 ?Support Surface(s) Assessment (bed, cushion, seat, etc.) ?INTERVENTIONS - Wound Cleansing / Measurement ?[name], [name] ([id number]) ?X- 1 5 ?  Simple Wound Cleansing - one wound ?'[]'$  - 0 ?Complex Wound Cleansing - multiple wounds ?X- 1 5 ?Wound Imaging (photographs - any number of wounds) ?'[]'$  - 0 ?Wound Tracing (instead of photographs) ?X- 1 5 ?Simple Wound Measurement - one wound ?'[]'$  - 0 ?Complex Wound Measurement - multiple wounds ?INTERVENTIONS - Wound Dressings ?'[]'$  - Small Wound Dressing one or multiple wounds 0 ?'[]'$  - 0 ?Medium Wound Dressing one or multiple wounds ?'[]'$  - 0 ?Large Wound Dressing one or multiple wounds ?'[]'$  - 0 ?Application of Medications - topical ?'[]'$  - 0 ?Application of Medications - injection ?INTERVENTIONS - Miscellaneous ?'[]'$  - External ear exam 0 ?'[]'$  - 0 ?Specimen Collection (cultures, biopsies, blood, body fluids, etc.) ?'[]'$  - 0 ?Specimen(s) / Culture(s) sent or taken to Lab for analysis ?'[]'$  - 0 ?Patient Transfer (multiple staff / Civil Service fast streamer / Similar devices) ?'[]'$  - 0 ?Simple Staple / Suture removal (25 or less) ?'[]'$  - 0 ?Complex Staple / Suture removal (26 or more) ?'[]'$  - 0 ?Hypo / Hyperglycemic Management (close monitor of Blood Glucose) ?'[]'$  - 0 ?Ankle / Brachial Index (ABI) - do not check if billed separately ?X- 1 5 ?Vital Signs ?Has the patient been seen at the hospital within the last three years: Yes ?Total Score: 65 ?Level Of Care: New/Established - Level ?2 ?Electronic Signature(s) ?Signed: 03/22/2022 4:01:37 PM By: Carlene Coria RN ?Entered By: Carlene Coria on 03/18/2022 14:58:09 ?[name], [name] ([id number]) ?-------------------------------------------------------------------------------- ?Encounter Discharge Information Details ?Patient Name: [name], [name] ?Date of Service: 03/18/2022 2:00 PM ?Medical Record Number: [id number] ?Patient Account Number: [id number] ?Date of Birth/Sex: [birth year] (61 y.o. M) ?Treating  RN: Carlene Coria ?Primary Care Javonnie Illescas: Halina Maidens Other Clinician: ?Referring Temperance Kelemen: Halina Maidens ?Treating Simona Rocque/Extender: Jeri Cos ?Weeks in Treatment: 39 ?Encounter Discharge Information Items ?Discharge Condition: Stable ?Ambulatory Status: Ambulatory ?Discharge Destination: Home ?Transportation: Private Auto ?Accompanied By: wife ?Schedule Follow-up Appointment: Yes ?Clinical Summary of Care: Patient Declined ?Electronic Signature(s) ?Signed: 03/22/2022 4:01:37 PM By: Carlene Coria RN ?Entered By: Carlene Coria on 03/18/2022 15:00:02 ?[name], [name] ([id number]) ?-------------------------------------------------------------------------------- ?Lower Extremity Assessment Details ?Patient Name: [name], [name] ?Date of Service: 03/18/2022 2:00 PM ?Medical Record Number: [id number] ?Patient Account Number: [id number] ?Date of Birth/Sex: 1961-06-10 (61 y.o. M) ?Treating RN: Carlene Coria ?Primary Care Kamile Fassler: Halina Maidens Other Clinician: ?Referring Laraine Samet: Halina Maidens ?Treating Eric Morganti/Extender: Jeri Cos ?Weeks in Treatment: 39 ?Electronic Signature(s) ?Signed: 03/22/2022 4:01:37 PM By: Carlene Coria RN ?Entered By: Carlene Coria on 03/18/2022 14:56:33 ?[name], [name] ([id number]) ?-------------------------------------------------------------------------------- ?Multi Wound Chart Details ?Patient Name: [name], [name] ?Date of Service: 03/18/2022 2:00 PM ?Medical Record Number: [id number] ?Patient Account Number: [id number] ?Date of Birth/Sex: [birth year] (61 y.o. M) ?Treating RN: Carlene Coria ?Primary Care Lavell Supple: Halina Maidens Other Clinician: ?Referring Risa Auman: Halina Maidens ?Treating Hardin Hardenbrook/Extender: Jeri Cos ?Weeks in Treatment: 39 ?Vital Signs ?Height(in): 66 ?Pulse(bpm): 71 ?Weight(lbs): 161 ?Blood Pressure(mmHg): 129/75 ?Body Mass Index(BMI): 26 ?Temperature(??F): 98.7 ?Respiratory Rate(breaths/min): 18 ?Photos: [N/A:N/A] ?Wound Location: Right, Lateral Ankle N/A  N/A ?Wounding Event: Trauma N/A N/A ?Primary Etiology: Trauma, Other N/A N/A ?Comorbid History: Hypertension N/A N/A ?Date Acquired: 05/09/2021 N/A N/A ?Weeks of Treatment: 79 N/A N/A ?Wound Status: Healed - E

## 2022-04-08 ENCOUNTER — Ambulatory Visit: Payer: 59 | Admitting: Physician Assistant

## 2022-04-29 ENCOUNTER — Ambulatory Visit: Payer: 59 | Admitting: Internal Medicine

## 2022-07-15 ENCOUNTER — Ambulatory Visit: Payer: 59 | Admitting: Internal Medicine

## 2022-07-15 ENCOUNTER — Encounter: Payer: Self-pay | Admitting: Internal Medicine

## 2022-07-15 VITALS — BP 118/70 | HR 66 | Ht 66.0 in | Wt 162.0 lb

## 2022-07-15 DIAGNOSIS — R7303 Prediabetes: Secondary | ICD-10-CM

## 2022-07-15 DIAGNOSIS — E039 Hypothyroidism, unspecified: Secondary | ICD-10-CM | POA: Diagnosis not present

## 2022-07-15 DIAGNOSIS — I10 Essential (primary) hypertension: Secondary | ICD-10-CM

## 2022-07-15 NOTE — Progress Notes (Signed)
Date:  07/15/2022   Name:  [name]   DOB:  1961/08/08   MRN:  [id number]   Chief Complaint: Hypertension, Diabetes, and Hypothyroidism  Hypertension This is a chronic problem. The problem is controlled. Pertinent negatives include no chest pain, headaches, palpitations or shortness of breath. Past treatments include angiotensin blockers. The current treatment provides significant improvement. Identifiable causes of hypertension include a thyroid problem.  Thyroid Problem Presents for follow-up (new onset earlier this year) visit. Patient reports no constipation, diarrhea, fatigue, hoarse voice, leg swelling, palpitations or weight loss.  Diabetes He presents for his follow-up diabetic visit. Diabetes type: prediabetes going into type 2. Pertinent negatives for hypoglycemia include no dizziness or headaches. Pertinent negatives for diabetes include no chest pain, no fatigue, no weakness and no weight loss. Symptoms are stable. Current diabetic treatment includes diet. He is compliant with treatment most of the time.    Lab Results  Component Value Date   NA 142 10/29/2021   K 4.7 10/29/2021   CO2 24 10/29/2021   GLUCOSE 104 (H) 10/29/2021   BUN 16 10/29/2021   CREATININE 1.06 10/29/2021   CALCIUM 9.7 10/29/2021   EGFR 79 10/29/2021   GFRNONAA 76 10/23/2020   Lab Results  Component Value Date   CHOL 188 10/29/2021   HDL 43 10/29/2021   LDLCALC 119 (H) 10/29/2021   TRIG 148 10/29/2021   CHOLHDL 4.4 10/29/2021   Lab Results  Component Value Date   TSH 16.500 (H) 03/04/2022   Lab Results  Component Value Date   HGBA1C 6.5 (H) 03/04/2022   Lab Results  Component Value Date   WBC 8.0 10/29/2021   HGB 15.6 10/29/2021   HCT 46.2 10/29/2021   MCV 88 10/29/2021   PLT 261 10/29/2021   Lab Results  Component Value Date   ALT 41 10/29/2021   AST 29 10/29/2021   ALKPHOS 64 10/29/2021   BILITOT 0.5 10/29/2021   No results found for: "25OHVITD2", "25OHVITD3",  "VD25OH"   Review of Systems  Constitutional:  Negative for fatigue, unexpected weight change and weight loss.  HENT:  Negative for hoarse voice and nosebleeds.   Eyes:  Negative for visual disturbance.  Respiratory:  Negative for cough, chest tightness, shortness of breath and wheezing.   Cardiovascular:  Negative for chest pain, palpitations and leg swelling.  Gastrointestinal:  Negative for abdominal pain, constipation and diarrhea.  Neurological:  Negative for dizziness, weakness, light-headedness and headaches.    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  Current Meds  Medication Sig   atorvastatin (LIPITOR) 10 MG tablet TAKE 1 TABLET(10 MG) BY MOUTH DAILY   levothyroxine (SYNTHROID) 25 MCG tablet Take 1 tablet (25 mcg total) by mouth daily.   olmesartan (BENICAR) 20 MG tablet Take 1 tablet (20 mg total) by mouth daily.       07/15/2022    9:04 AM 03/04/2022    8:17 AM 10/29/2021    8:12 AM 10/23/2020    8:32 AM  GAD 7 : Generalized Anxiety Score   Nervous, Anxious, on Edge 0 0 0 0  Control/stop worrying 0 0 0 0  Worry too much - different things 0 0 0 0  Trouble relaxing 0 0 0 0  Restless 0 0 0 0  Easily annoyed or irritable 0 0 0 0  Afraid - awful might happen 0 0 0 0  Total GAD 7 Score 0 0 0 0  Anxiety Difficulty Not difficult at all Not difficult at all Not difficult at all Not difficult at all       07/15/2022    9:03 AM 03/04/2022    8:17 AM 10/29/2021    8:12 AM  Depression screen PHQ 2/9  Decreased Interest 0 0 0  Down, Depressed, Hopeless 0 0 0  PHQ - 2 Score 0 0 0  Altered sleeping 0 0 0  Tired, decreased energy 0 0 0  Change in appetite 0 0 0  Feeling bad or failure about yourself  0 0 0  Trouble concentrating 0 0 0  Moving slowly or fidgety/restless 0 0 0  Suicidal thoughts 0 0 0  PHQ-9 Score 0 0 0  Difficult doing work/chores Not difficult at all Not difficult at all Not difficult at all    BP Readings from Last 3 Encounters:  07/15/22 118/70  03/04/22 118/78  10/29/21 122/78    Physical Exam Vitals and nursing note reviewed.  Constitutional:      General: He is not in acute distress.    Appearance: He is well-developed.  HENT:     Head: Normocephalic and atraumatic.  Neck:     Vascular: No carotid bruit.  Cardiovascular:     Rate and Rhythm: Normal rate and regular rhythm.     Heart sounds: No murmur heard. Pulmonary:     Effort: Pulmonary effort is normal. No respiratory distress.     Breath sounds: No wheezing or rhonchi.  Musculoskeletal:     Cervical back: Normal range of motion. No tenderness.     Right lower leg: No edema.     Left lower leg: No edema.  Lymphadenopathy:     Cervical: No cervical adenopathy.  Skin:    General: Skin is warm and dry.     Capillary Refill: Capillary refill takes less than 2 seconds.     Findings: No rash.  Neurological:     General: No focal deficit present.     Mental Status: He is alert and oriented to person, place, and time.  Psychiatric:         Mood and Affect: Mood normal.        Behavior: Behavior normal.     Wt Readings from Last 3 Encounters:  07/15/22 162 lb (73.5 kg)  03/04/22 165 lb 6.4 oz (75 kg)  10/29/21 162 lb (73.5 kg)    BP 118/70   Pulse 66   Ht 5' 6"  (1.676 m)   Wt 162 lb (73.5 kg)  SpO2 96%   BMI 26.15 kg/m   Assessment and Plan: 1. Essential hypertension Clinically stable exam with well controlled BP. Tolerating medications without side effects at this time. Pt to continue current regimen and low sodium diet; benefits of regular exercise as able discussed. - Basic metabolic panel  2. Hypothyroidism (acquired) Taking medications most days - can change dosing to PM for better compliance - TSH + free T4  3. Prediabetes Discussed need to limit bread, rice and other carbs - Hemoglobin A1c   Partially dictated using Editor, commissioning. Any errors are unintentional.  Halina Maidens, MD [facility]  07/15/2022

## 2022-07-16 LAB — BASIC METABOLIC PANEL
BUN/Creatinine Ratio: 15 (ref 10–24)
BUN: 15 mg/dL (ref 8–27)
CO2: 18 mmol/L — ABNORMAL LOW (ref 20–29)
Calcium: 9.4 mg/dL (ref 8.6–10.2)
Chloride: 102 mmol/L (ref 96–106)
Creatinine, Ser: 1.03 mg/dL (ref 0.76–1.27)
Glucose: 111 mg/dL — ABNORMAL HIGH (ref 70–99)
Potassium: 4.8 mmol/L (ref 3.5–5.2)
Sodium: 137 mmol/L (ref 134–144)
eGFR: 82 mL/min/{1.73_m2} (ref >59)

## 2022-07-16 LAB — HEMOGLOBIN A1C
Est. average glucose Bld gHb Est-mCnc: 137 mg/dL
Hgb A1c MFr Bld: 6.4 % — ABNORMAL HIGH (ref 4.8–5.6)

## 2022-07-16 LAB — TSH+FREE T4
Free T4: 0.78 ng/dL — ABNORMAL LOW (ref 0.82–1.77)
TSH: 19.3 u[IU]/mL — ABNORMAL HIGH (ref 0.450–4.500)

## 2022-07-17 ENCOUNTER — Other Ambulatory Visit: Payer: Self-pay | Admitting: Internal Medicine

## 2022-07-17 DIAGNOSIS — E039 Hypothyroidism, unspecified: Secondary | ICD-10-CM

## 2022-07-17 MED ORDER — LEVOTHYROXINE SODIUM 75 MCG PO TABS: 75.0000 ug | ORAL_TABLET | Freq: Every day | ORAL | 1 refills | Status: DC

## 2022-09-12 ENCOUNTER — Other Ambulatory Visit: Payer: Self-pay | Admitting: Internal Medicine

## 2022-09-12 DIAGNOSIS — E039 Hypothyroidism, unspecified: Secondary | ICD-10-CM

## 2022-09-13 NOTE — Telephone Encounter (Signed)
No longer current dosing Requested Prescriptions  Pending Prescriptions Disp Refills  . levothyroxine (SYNTHROID) 25 MCG tablet [Pharmacy Med Name: LEVOTHYROXINE 0.'025MG'$  (25MCG) TAB] 90 tablet 1    Sig: TAKE 1 TABLET(25 MCG) BY MOUTH DAILY     Endocrinology:  Hypothyroid Agents Failed - 09/12/2022  3:35 AM      Failed - TSH in normal range and within 360 days    TSH  Date Value Ref Range Status  07/15/2022 19.300 (H) 0.450 - 4.500 uIU/mL Final         Passed - Valid encounter within last 12 months    Recent Outpatient Visits          2 months ago Essential hypertension   [place] [facility] at [facility], Jesse Sans, MD   6 months ago Essential hypertension    [facility] and [facility] Medicine at [facility], Jesse Sans, MD   10 months ago Annual physical exam   [facility] at [facility], Jesse Sans, MD   1 year ago Annual physical exam   [facility] at [facility], Jesse Sans, MD   2 years ago Annual physical exam   [facility] at [facility], Jesse Sans, MD      Future Appointments            In 1 month Army Melia, Jesse Sans, MD [place] [facility] at [facility], [facility]

## 2022-11-04 ENCOUNTER — Ambulatory Visit (INDEPENDENT_AMBULATORY_CARE_PROVIDER_SITE_OTHER): Payer: 59 | Admitting: Internal Medicine

## 2022-11-04 ENCOUNTER — Encounter: Payer: Self-pay | Admitting: Internal Medicine

## 2022-11-04 VITALS — BP 128/70 | HR 69 | Ht 66.0 in | Wt 164.0 lb

## 2022-11-04 DIAGNOSIS — Z1211 Encounter for screening for malignant neoplasm of colon: Secondary | ICD-10-CM | POA: Diagnosis not present

## 2022-11-04 DIAGNOSIS — R7303 Prediabetes: Secondary | ICD-10-CM

## 2022-11-04 DIAGNOSIS — I10 Essential (primary) hypertension: Secondary | ICD-10-CM

## 2022-11-04 DIAGNOSIS — Z0000 Encounter for general adult medical examination without abnormal findings: Secondary | ICD-10-CM

## 2022-11-04 DIAGNOSIS — E782 Mixed hyperlipidemia: Secondary | ICD-10-CM

## 2022-11-04 DIAGNOSIS — Z23 Encounter for immunization: Secondary | ICD-10-CM | POA: Diagnosis not present

## 2022-11-04 DIAGNOSIS — C61 Malignant neoplasm of prostate: Secondary | ICD-10-CM

## 2022-11-04 DIAGNOSIS — E039 Hypothyroidism, unspecified: Secondary | ICD-10-CM | POA: Diagnosis not present

## 2022-11-04 MED ORDER — OLMESARTAN MEDOXOMIL 20 MG PO TABS: 20.0000 mg | ORAL_TABLET | Freq: Every day | ORAL | 3 refills | Status: DC

## 2022-11-04 MED ORDER — ATORVASTATIN CALCIUM 10 MG PO TABS
ORAL_TABLET | ORAL | 3 refills | Status: DC
Start: 2022-11-04 — End: 2022-11-06

## 2022-11-04 MED ORDER — LEVOTHYROXINE SODIUM 75 MCG PO TABS: 75.0000 ug | ORAL_TABLET | Freq: Every day | ORAL | 1 refills | Status: DC

## 2022-11-04 NOTE — Progress Notes (Signed)
Date:  11/04/2022   Name:  [name]   DOB:  06/25/1961   MRN:  [id number]   Chief Complaint: Annual Exam [name] is a 61 y.o. male who presents today for his Complete Annual Exam. He feels fairly well. He reports exercising walking at work. He reports he is sleeping well.   Colonoscopy: FIT 10/2021  Immunization History  Administered Date(s) Administered   COVID-19, mRNA, vaccine(Comirnaty)12 years and older 11/04/2022   Influenza-Unspecified 09/15/2017, 09/15/2020, 10/09/2021, 10/06/2022   Moderna Sars-Covid-2 Vaccination 07/26/2020, 08/26/2020   Health Maintenance Due  Topic Date Due   TETANUS/TDAP  Never done   Zoster Vaccines- Shingrix (1 of 2) Never done   COLONOSCOPY (Pts 45-109yr Insurance coverage will need to be confirmed)  Never done    Lab Results  Component Value Date   PSA1 4.8 (H) 03/04/2022   PSA1 5.0 (H) 10/29/2021   PSA1 6.3 (H) 06/22/2020    Hypertension This is a chronic problem. The problem is controlled. Pertinent negatives include no chest pain, headaches, palpitations or shortness of breath. Past treatments include angiotensin blockers. The current treatment provides significant improvement. There is no history of kidney disease, CAD/MI or CVA. Identifiable causes of hypertension include a thyroid problem.  Hyperlipidemia This is a chronic problem. Recent lipid tests were reviewed and are high. Pertinent negatives include no chest pain, myalgias or shortness of breath. Current antihyperlipidemic treatment includes statins. The current treatment provides moderate improvement of lipids.  Thyroid Problem Presents for follow-up visit. Patient reports no anxiety, constipation, diaphoresis, diarrhea, fatigue or palpitations. The symptoms have been stable. His past medical history is significant for hyperlipidemia.  Prostate cancer - no repeat bx was done after initial dx.  He is reluctant to return to Urology.  PSA done in March was slightly  lower.  He denies LUTS.  Lab Results  Component Value Date   NA 137 07/15/2022   K 4.8 07/15/2022   CO2 18 (L) 07/15/2022   GLUCOSE 111 (H) 07/15/2022   BUN 15 07/15/2022   CREATININE 1.03 07/15/2022   CALCIUM 9.4 07/15/2022   EGFR 82 07/15/2022   GFRNONAA 76 10/23/2020   Lab Results  Component Value Date   CHOL 188 10/29/2021   HDL 43 10/29/2021   LDLCALC 119 (H) 10/29/2021   TRIG 148 10/29/2021   CHOLHDL 4.4 10/29/2021   Lab Results  Component Value Date   TSH 19.300 (H) 07/15/2022   Lab Results  Component Value Date   HGBA1C 6.4 (H) 07/15/2022   Lab Results  Component Value Date   WBC 8.0 10/29/2021   HGB 15.6 10/29/2021   HCT 46.2 10/29/2021   MCV 88 10/29/2021   PLT 261 10/29/2021   Lab Results  Component Value Date   ALT 41 10/29/2021   AST 29 10/29/2021   ALKPHOS 64 10/29/2021   BILITOT 0.5 10/29/2021   No results found for: "25OHVITD2", "25OHVITD3", "VD25OH"   Review of Systems  Constitutional:  Negative for appetite change, chills, diaphoresis, fatigue and unexpected weight change.  HENT:  Negative for hearing loss, tinnitus, trouble swallowing and voice change.   Eyes:  Negative for visual disturbance.  Respiratory:  Negative for choking, shortness of breath and wheezing.   Cardiovascular:  Negative for chest pain, palpitations and leg swelling.  Gastrointestinal:  Negative for abdominal pain, blood in stool, constipation and diarrhea.  Genitourinary:  Negative for difficulty urinating, dysuria and frequency.  Musculoskeletal:  Negative for arthralgias, back pain and myalgias.  Skin:  Negative for color change and rash.  Neurological:  Negative for dizziness, syncope and headaches.  Hematological:  Negative for adenopathy.  Psychiatric/Behavioral:  Negative for dysphoric mood and sleep disturbance. The patient is not nervous/anxious.     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  Current Meds  Medication Sig   atorvastatin (LIPITOR) 10 MG tablet TAKE 1 TABLET(10 MG) BY MOUTH DAILY   levothyroxine (SYNTHROID) 75 MCG tablet Take 1 tablet (75 mcg total) by mouth daily.   olmesartan (BENICAR) 20 MG tablet Take 1 tablet (20 mg total) by mouth daily.       11/04/2022    8:13 AM 07/15/2022    9:04 AM 03/04/2022    8:17 AM 10/29/2021    8:12 AM  GAD 7 : Generalized Anxiety Score  Nervous, Anxious, on Edge 0 0 0 0  Control/stop worrying 0 0 0 0  Worry too much - different things 0 0 0 0  Trouble relaxing 0 0 0 0  Restless 0 0 0 0  Easily annoyed or irritable 0 0 0 0  Afraid - awful might happen 0 0 0 0  Total GAD 7 Score 0 0 0 0  Anxiety Difficulty Not difficult at all Not difficult at all Not difficult at all Not difficult at all       11/04/2022    8:13 AM 07/15/2022    9:03 AM 03/04/2022    8:17 AM  Depression screen PHQ 2/9  Decreased Interest 0 0 0  Down, Depressed, Hopeless 0 0 0  PHQ - 2 Score 0 0 0  Altered sleeping 0 0 0  Tired, decreased energy 0 0 0  Change in appetite 0 0 0  Feeling bad  or failure about yourself  0 0 0  Trouble concentrating 0 0 0  Moving slowly or fidgety/restless 0 0 0  Suicidal thoughts 0 0 0  PHQ-9 Score 0 0 0  Difficult doing work/chores Not difficult at all Not difficult at all Not difficult at all    BP Readings from Last 3 Encounters:  11/04/22 128/70  07/15/22 118/70  03/04/22 118/78    Physical Exam Vitals and nursing note reviewed.  Constitutional:      Appearance: Normal appearance. He is well-developed.  HENT:     Head: Normocephalic.     Right Ear: Tympanic membrane, ear canal and external ear normal.     Left Ear: Tympanic membrane, ear canal  and external ear normal.     Nose: Nose normal.  Eyes:     Conjunctiva/sclera: Conjunctivae normal.     Pupils: Pupils are equal, round, and reactive to light.  Neck:     Thyroid: No thyromegaly.     Vascular: No carotid bruit.  Cardiovascular:     Rate and Rhythm: Normal rate and regular rhythm.     Heart sounds: Normal heart sounds.  Pulmonary:     Effort: Pulmonary effort is normal.     Breath sounds: Normal breath sounds. No wheezing.  Chest:  Breasts:    Right: No mass.     Left: No mass.  Abdominal:     General: Bowel sounds are normal.     Palpations: Abdomen is soft.     Tenderness: There is no abdominal tenderness.  Musculoskeletal:        General: Normal range of motion.     Cervical back: Normal range of motion and neck supple.  Lymphadenopathy:     Cervical: No cervical adenopathy.  Skin:    General: Skin is warm and dry.  Neurological:     Mental Status: He is alert and oriented to person, place, and time.     Deep Tendon Reflexes: Reflexes are normal and symmetric.  Psychiatric:        Attention and Perception: Attention normal.        Mood and Affect: Mood normal.        Thought Content: Thought content normal.     Wt Readings from Last 3 Encounters:  11/04/22 164 lb (74.4 kg)  07/15/22 162 lb (73.5 kg)  03/04/22 165 lb 6.4 oz (75 kg)    BP 128/70    Pulse 69   Ht 5' 6" (1.676 m)   Wt 164 lb (74.4 kg)   SpO2 96%   BMI 26.47 kg/m   Assessment and Plan: 1. Annual physical exam Normal exam Up to date on screenings and immunizations. - CBC with Differential/Platelet - Comprehensive metabolic panel - Hemoglobin A1c - Lipid panel - TSH + free T4  2. Colon cancer screening - Fecal occult blood, imunochemical  3. Essential hypertension Clinically stable exam with well controlled BP. Tolerating medications without side effects at this time. Pt to continue current regimen and low sodium diet; benefits of regular exercise as able discussed. - CBC with Differential/Platelet - Comprehensive metabolic panel - olmesartan (BENICAR) 20 MG tablet; Take 1 tablet (20 mg total) by mouth daily.  Dispense: 90 tablet; Refill: 3  4. Hyperlipidemia, mixed Tolerating statin medication without side effects at this time LDL is at goal of < 70 on current dose Continue same therapy without change at this time. - Lipid panel - atorvastatin (LIPITOR) 10 MG tablet; TAKE 1 TABLET(10 MG) BY MOUTH DAILY  Dispense: 90 tablet; Refill: 3  5. Hypothyroidism (acquired) supplemented - TSH + free T4 - levothyroxine (SYNTHROID) 75 MCG tablet; Take 1 tablet (75 mcg total) by mouth daily.  Dispense: 90 tablet; Refill: 1  6. Prediabetes Stable weight; check labs and advise - Hemoglobin A1c  7. Prostate cancer [facility] - ) DRE deferred; pt has minimal LUTS Will recheck PSA and refer back to Urology if higher - PSA  8. Encounter for immunization [employer] Fall 2023 Covid-19 Vaccine 74yr and older   Partially dictated using DEditor, commissioning Any errors are unintentional.  LHalina Maidens MD [facility]  11/04/2022

## 2022-11-05 LAB — COMPREHENSIVE METABOLIC PANEL
ALT: 59 IU/L — ABNORMAL HIGH (ref 0–44)
AST: 32 IU/L (ref 0–40)
Albumin/Globulin Ratio: 1.5 (ref 1.2–2.2)
Albumin: 4.5 g/dL (ref 3.9–4.9)
Alkaline Phosphatase: 79 IU/L (ref 44–121)
BUN/Creatinine Ratio: 14 (ref 10–24)
BUN: 17 mg/dL (ref 8–27)
Bilirubin Total: 0.4 mg/dL (ref 0.0–1.2)
CO2: 23 mmol/L (ref 20–29)
Calcium: 9.9 mg/dL (ref 8.6–10.2)
Chloride: 104 mmol/L (ref 96–106)
Creatinine, Ser: 1.19 mg/dL (ref 0.76–1.27)
Globulin, Total: 3 g/dL (ref 1.5–4.5)
Glucose: 101 mg/dL — ABNORMAL HIGH (ref 70–99)
Potassium: 5 mmol/L (ref 3.5–5.2)
Sodium: 144 mmol/L (ref 134–144)
Total Protein: 7.5 g/dL (ref 6.0–8.5)
eGFR: 69 mL/min/{1.73_m2} (ref >59)

## 2022-11-05 LAB — LIPID PANEL
Chol/HDL Ratio: 5.2 ratio — ABNORMAL HIGH (ref 0.0–5.0)
Cholesterol, Total: 201 mg/dL — ABNORMAL HIGH (ref 100–199)
HDL: 39 mg/dL — ABNORMAL LOW (ref >39)
LDL Chol Calc (NIH): 125 mg/dL — ABNORMAL HIGH (ref 0–99)
Triglycerides: 210 mg/dL — ABNORMAL HIGH (ref 0–149)
VLDL Cholesterol Cal: 37 mg/dL (ref 5–40)

## 2022-11-05 LAB — CBC WITH DIFFERENTIAL/PLATELET
Basophils Absolute: 0.1 10*3/uL (ref 0.0–0.2)
Basos: 1 %
EOS (ABSOLUTE): 0.3 10*3/uL (ref 0.0–0.4)
Eos: 5 %
Hematocrit: 51.2 % — ABNORMAL HIGH (ref 37.5–51.0)
Hemoglobin: 16.8 g/dL (ref 13.0–17.7)
Immature Grans (Abs): 0.1 10*3/uL (ref 0.0–0.1)
Immature Granulocytes: 1 %
Lymphocytes Absolute: 2.7 10*3/uL (ref 0.7–3.1)
Lymphs: 38 %
MCH: 29.2 pg (ref 26.6–33.0)
MCHC: 32.8 g/dL (ref 31.5–35.7)
MCV: 89 fL (ref 79–97)
Monocytes Absolute: 0.6 10*3/uL (ref 0.1–0.9)
Monocytes: 8 %
Neutrophils Absolute: 3.4 10*3/uL (ref 1.4–7.0)
Neutrophils: 47 %
Platelets: 261 10*3/uL (ref 150–450)
RBC: 5.76 x10E6/uL (ref 4.14–5.80)
RDW: 13.6 % (ref 11.6–15.4)
WBC: 7.1 10*3/uL (ref 3.4–10.8)

## 2022-11-05 LAB — PSA: Prostate Specific Ag, Serum: 4.8 ng/mL — ABNORMAL HIGH (ref 0.0–4.0)

## 2022-11-05 LAB — HEMOGLOBIN A1C
Est. average glucose Bld gHb Est-mCnc: 140 mg/dL
Hgb A1c MFr Bld: 6.5 % — ABNORMAL HIGH (ref 4.8–5.6)

## 2022-11-05 LAB — TSH+FREE T4
Free T4: 1.07 ng/dL (ref 0.82–1.77)
TSH: 7.97 u[IU]/mL — ABNORMAL HIGH (ref 0.450–4.500)

## 2022-11-06 ENCOUNTER — Other Ambulatory Visit: Payer: Self-pay | Admitting: Internal Medicine

## 2022-11-06 DIAGNOSIS — E782 Mixed hyperlipidemia: Secondary | ICD-10-CM

## 2022-11-06 MED ORDER — ATORVASTATIN CALCIUM 20 MG PO TABS: ORAL_TABLET | ORAL | 1 refills | Status: DC

## 2022-12-12 ENCOUNTER — Other Ambulatory Visit: Payer: Self-pay | Admitting: Internal Medicine

## 2022-12-12 DIAGNOSIS — I10 Essential (primary) hypertension: Secondary | ICD-10-CM

## 2023-01-27 ENCOUNTER — Other Ambulatory Visit: Payer: Self-pay | Admitting: Internal Medicine

## 2023-01-27 DIAGNOSIS — E039 Hypothyroidism, unspecified: Secondary | ICD-10-CM

## 2023-01-27 NOTE — Telephone Encounter (Signed)
Requested medications are due for refill today.  no  Requested medications are on the active medications list.  yes  Last refill. 11/04/2022 #90 1 rf  Future visit scheduled.   yes  Notes to clinic.  Too soon for RF. Abnormal labs.    Requested Prescriptions  Pending Prescriptions Disp Refills   levothyroxine (SYNTHROID) 75 MCG tablet [Pharmacy Med Name: LEVOTHYROXINE 0.'075MG'$  (75MCG) TABS] 90 tablet 1    Sig: TAKE 1 TABLET(75 MCG) BY MOUTH DAILY     Endocrinology:  Hypothyroid Agents Failed - 01/27/2023  5:24 PM      Failed - TSH in normal range and within 360 days    TSH  Date Value Ref Range Status  11/04/2022 7.970 (H) 0.450 - 4.500 uIU/mL Final         Passed - Valid encounter within last 12 months    Recent Outpatient Visits           2 months ago Annual physical exam   [place] [facility] at [facility], Jesse Sans, MD   6 months ago Essential hypertension   [place] [facility] at [facility], Jesse Sans, MD   10 months ago Essential hypertension   [place] [facility] at [facility], Jesse Sans, MD   1 year ago Annual physical exam   [place] at [facility], Jesse Sans, MD   2 years ago Annual physical exam   [place] at [facility], Jesse Sans, MD       Future Appointments             In 2 months Army Melia, Jesse Sans, MD [place] at [facility], [facility]   In 9 months Army Melia, Jesse Sans, MD [place] at [facility], [facility]

## 2023-03-14 ENCOUNTER — Ambulatory Visit: Payer: 59 | Admitting: Internal Medicine

## 2023-04-18 ENCOUNTER — Encounter: Payer: Self-pay | Admitting: Internal Medicine

## 2023-04-18 ENCOUNTER — Ambulatory Visit: Payer: 59 | Admitting: Internal Medicine

## 2023-04-18 VITALS — BP 130/72 | HR 59 | Ht 66.0 in | Wt 166.8 lb

## 2023-04-18 DIAGNOSIS — R7303 Prediabetes: Secondary | ICD-10-CM | POA: Diagnosis not present

## 2023-04-18 DIAGNOSIS — Z1211 Encounter for screening for malignant neoplasm of colon: Secondary | ICD-10-CM

## 2023-04-18 DIAGNOSIS — E039 Hypothyroidism, unspecified: Secondary | ICD-10-CM

## 2023-04-18 DIAGNOSIS — I10 Essential (primary) hypertension: Secondary | ICD-10-CM | POA: Diagnosis not present

## 2023-04-18 DIAGNOSIS — E782 Mixed hyperlipidemia: Secondary | ICD-10-CM

## 2023-04-18 DIAGNOSIS — C61 Malignant neoplasm of prostate: Secondary | ICD-10-CM

## 2023-04-18 MED ORDER — OLMESARTAN MEDOXOMIL 20 MG PO TABS: ORAL_TABLET | ORAL | 1 refills | Status: DC

## 2023-04-18 MED ORDER — LEVOTHYROXINE SODIUM 75 MCG PO TABS: 75.0000 ug | ORAL_TABLET | Freq: Every day | ORAL | 1 refills | Status: DC

## 2023-04-18 MED ORDER — ATORVASTATIN CALCIUM 20 MG PO TABS: ORAL_TABLET | ORAL | 1 refills | Status: DC

## 2023-04-18 NOTE — Assessment & Plan Note (Signed)
Lipids not controlled last visit Lipitor increased to 20 mg.

## 2023-04-18 NOTE — Assessment & Plan Note (Addendum)
Under active surveillance at this time Last PSA 6 mo ago was 4.8 and unchanged

## 2023-04-18 NOTE — Assessment & Plan Note (Signed)
Clinically stable exam with well controlled BP on olmesartan. Tolerating medications without side effects. Pt to continue current regimen and low sodium diet.  

## 2023-04-18 NOTE — Progress Notes (Signed)
Date:  04/18/2023   Name:  [name]   DOB:  Nov 09, 1961   MRN:  [id number]   Chief Complaint: Hypertension and Diabetes (Foot Exam, MICRO at lab.)  Hypertension This is a chronic problem. The problem is controlled. Pertinent negatives include no chest pain, headaches, palpitations or shortness of breath.  Hyperlipidemia This is a chronic problem. Recent lipid tests were reviewed and are high. Pertinent negatives include no chest pain or shortness of breath. Current antihyperlipidemic treatment includes statins.  Diabetes He presents for his follow-up diabetic visit. Diabetes type: prediabetes. Pertinent negatives for hypoglycemia include no dizziness, headaches or nervousness/anxiousness. Pertinent negatives for diabetes include no chest pain, no fatigue and no weakness. An ACE inhibitor/angiotensin II receptor blocker is being taken.    Lab Results  Component Value Date   NA 144 11/04/2022   K 5.0 11/04/2022   CO2 23 11/04/2022   GLUCOSE 101 (H) 11/04/2022   BUN 17 11/04/2022   CREATININE 1.19 11/04/2022   CALCIUM 9.9 11/04/2022   EGFR 69 11/04/2022   GFRNONAA 76 10/23/2020   Lab Results  Component Value Date   CHOL 201 (H) 11/04/2022   HDL 39 (L) 11/04/2022   LDLCALC 125 (H) 11/04/2022   TRIG 210 (H) 11/04/2022   CHOLHDL 5.2 (H) 11/04/2022   Lab Results  Component Value Date   TSH 7.970 (H) 11/04/2022   Lab Results  Component Value Date   HGBA1C 6.5 (H) 11/04/2022   Lab Results  Component Value Date   WBC 7.1 11/04/2022   HGB 16.8 11/04/2022   HCT 51.2 (H) 11/04/2022   MCV 89 11/04/2022   PLT 261 11/04/2022   Lab Results  Component Value Date   ALT 59 (H) 11/04/2022   AST 32 11/04/2022   ALKPHOS 79 11/04/2022   BILITOT 0.4 11/04/2022   No results found for: "25OHVITD2", "25OHVITD3", "VD25OH"   Review of Systems  Constitutional:  Negative for fatigue and unexpected weight change.  HENT:  Negative for nosebleeds and trouble swallowing.   Eyes:   Negative for visual disturbance.  Respiratory:  Negative for cough, chest tightness, shortness of breath and wheezing.   Cardiovascular:  Negative for chest pain, palpitations and leg swelling.  Gastrointestinal:  Negative for abdominal pain, constipation and diarrhea.  Musculoskeletal:  Negative for arthralgias.  Neurological:  Negative for dizziness, weakness, light-headedness and headaches.  Psychiatric/Behavioral:  Negative for sleep disturbance. The patient is not nervous/anxious.     Patient Active Problem List   Diagnosis Date Noted   Hypothyroidism (acquired) 03/06/2022   Non-healing wound of right lower extremity 03/04/2022   Prediabetes 11/04/2020   Carpal tunnel syndrome, bilateral 10/23/2020   Tobacco use disorder, severe, in sustained remission 10/16/2019   Gastroesophageal reflux disease 01/28/2019   Positive PPD, 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   Prostate cancer 07/27/2018   Hyperlipidemia, mixed 01/27/2018   Nocturia 10/09/2017   Essential hypertension 10/09/2017    No Known Allergies  Past Surgical History:  Procedure Laterality Date   lung collapse  2013    Social History   Tobacco Use   Smoking status: Former    Packs/day: 0.25    Years: 40.00    Additional pack years: 0.00    Total pack years: 10.00    Types: Cigarettes    Quit date: 11/25/2017    Years since quitting: 5.3   Smokeless tobacco: Never  Vaping Use   Vaping Use: Never used  Substance Use Topics   Alcohol use: No  Drug use: No     Medication list has been reviewed and updated.  Current Meds  Medication Sig   [DISCONTINUED] atorvastatin (LIPITOR) 20 MG tablet TAKE 1 TABLET(10 MG) BY MOUTH DAILY   [DISCONTINUED] levothyroxine (SYNTHROID) 75 MCG tablet Take 1 tablet (75 mcg total) by mouth daily.   [DISCONTINUED] olmesartan (BENICAR) 20 MG tablet TAKE 1 TABLET(20 MG) BY MOUTH DAILY       04/18/2023   10:10 AM 11/04/2022    8:13 AM 07/15/2022    9:04 AM 03/04/2022    8:17 AM  GAD 7 :  Generalized Anxiety Score  Nervous, Anxious, on Edge 0 0 0 0  Control/stop worrying 0 0 0 0  Worry too much - different things 0 0 0 0  Trouble relaxing 0 0 0 0  Restless 0 0 0 0  Easily annoyed or irritable 0 0 0 0  Afraid - awful might happen 0 0 0 0  Total GAD 7 Score 0 0 0 0  Anxiety Difficulty Not difficult at all Not difficult at all Not difficult at all Not difficult at all       04/18/2023   10:10 AM 11/04/2022    8:13 AM 07/15/2022    9:03 AM  Depression screen PHQ 2/9  Decreased Interest 0 0 0  Down, Depressed, Hopeless 0 0 0  PHQ - 2 Score 0 0 0  Altered sleeping 0 0 0  Tired, decreased energy 0 0 0  Change in appetite 0 0 0  Feeling bad or failure about yourself  0 0 0  Trouble concentrating 0 0 0  Moving slowly or fidgety/restless 0 0 0  Suicidal thoughts 0 0 0  PHQ-9 Score 0 0 0  Difficult doing work/chores Not difficult at all Not difficult at all Not difficult at all    BP Readings from Last 3 Encounters:  04/18/23 130/72  11/04/22 128/70  07/15/22 118/70    Physical Exam Vitals and nursing note reviewed.  Constitutional:      General: He is not in acute distress.    Appearance: He is well-developed.  HENT:     Head: Normocephalic and atraumatic.  Cardiovascular:     Rate and Rhythm: Normal rate and regular rhythm.  Pulmonary:     Effort: Pulmonary effort is normal. No respiratory distress.     Breath sounds: No wheezing or rhonchi.  Musculoskeletal:        General: Normal range of motion.     Cervical back: Normal range of motion.     Right lower leg: No edema.     Left lower leg: No edema.  Skin:    General: Skin is warm and dry.     Capillary Refill: Capillary refill takes less than 2 seconds.     Findings: No rash.  Neurological:     General: No focal deficit present.     Mental Status: He is alert and oriented to person, place, and time.  Psychiatric:        Mood and Affect: Mood normal.        Behavior: Behavior normal.     Diabetic Foot Exam - Simple   Simple Foot Form Diabetic Foot exam was performed with the following findings: Yes 04/18/2023 10:14 AM  Visual Inspection No deformities, no ulcerations, no other skin breakdown bilaterally: Yes Sensation Testing Intact to touch and monofilament testing bilaterally: Yes Pulse Check Posterior Tibialis and Dorsalis pulse intact bilaterally: Yes Comments  Wt Readings from Last 3 Encounters:  04/18/23 166 lb 12.8 oz (75.7 kg)  11/04/22 164 lb (74.4 kg)  07/15/22 162 lb (73.5 kg)    BP 130/72   Pulse (!) 59   Ht  (1.676 m)   Wt 166 lb 12.8 oz (75.7 kg)   SpO2 95%   BMI 26.92 kg/m   Assessment and Plan:  Problem List Items Addressed This Visit       Cardiovascular and Mediastinum   Essential hypertension - Primary (Chronic)    Clinically stable exam with well controlled BP on olmesartan. Tolerating medications without side effects. Pt to continue current regimen and low sodium diet.       Relevant Medications   olmesartan (BENICAR) 20 MG tablet   atorvastatin (LIPITOR) 20 MG tablet   Other Relevant Orders   Comprehensive metabolic panel     Endocrine   Hypothyroidism (acquired) (Chronic)   Relevant Medications   levothyroxine (SYNTHROID) 75 MCG tablet     Genitourinary   Prostate cancer (Chronic)    Under active surveillance at this time Last PSA 6 mo ago was 4.8 and unchanged        Other   Hyperlipidemia, mixed (Chronic)    Lipids not controlled last visit Lipitor increased to 20 mg.       Relevant Medications   olmesartan (BENICAR) 20 MG tablet   atorvastatin (LIPITOR) 20 MG tablet   Other Relevant Orders   Lipid panel   Prediabetes (Chronic)    Currently working with diet changes.      Lab Results  Component Value Date    HGBA1C 6.5 (H) 11/04/2022  He has gained a few pounds. If A1C is higher will need to start medication - metformin ER      Relevant Orders   Hemoglobin A1c   Microalbumin /  creatinine urine ratio   Other Visit Diagnoses     Colon cancer screening       Relevant Orders   Fecal occult blood, imunochemical       Return in about 4 months (around 08/18/2023) for HTN, prediabetes.   Partially dictated using Dragon software, any errors are not intentional.  Reubin Milan, MD [facility] [place], [place]

## 2023-04-18 NOTE — Assessment & Plan Note (Signed)
Currently working with diet changes.      Lab Results  Component Value Date    HGBA1C 6.5 (H) 11/04/2022  He has gained a few pounds. If A1C is higher will need to start medication - metformin ER

## 2023-04-19 LAB — MICROALBUMIN / CREATININE URINE RATIO
Creatinine, Urine: 201.8 mg/dL
Microalb/Creat Ratio: 7 mg/g creat (ref 0–29)
Microalbumin, Urine: 13.8 ug/mL

## 2023-04-19 LAB — COMPREHENSIVE METABOLIC PANEL
ALT: 51 IU/L — ABNORMAL HIGH (ref 0–44)
AST: 29 IU/L (ref 0–40)
Albumin/Globulin Ratio: 1.4 (ref 1.2–2.2)
Albumin: 4.4 g/dL (ref 3.9–4.9)
Alkaline Phosphatase: 84 IU/L (ref 44–121)
BUN/Creatinine Ratio: 13 (ref 10–24)
BUN: 13 mg/dL (ref 8–27)
Bilirubin Total: 0.6 mg/dL (ref 0.0–1.2)
CO2: 21 mmol/L (ref 20–29)
Calcium: 9.6 mg/dL (ref 8.6–10.2)
Chloride: 102 mmol/L (ref 96–106)
Creatinine, Ser: 1.03 mg/dL (ref 0.76–1.27)
Globulin, Total: 3.2 g/dL (ref 1.5–4.5)
Glucose: 100 mg/dL — ABNORMAL HIGH (ref 70–99)
Potassium: 4.9 mmol/L (ref 3.5–5.2)
Sodium: 137 mmol/L (ref 134–144)
Total Protein: 7.6 g/dL (ref 6.0–8.5)
eGFR: 82 mL/min/{1.73_m2} (ref >59)

## 2023-04-19 LAB — LIPID PANEL
Chol/HDL Ratio: 3.9 ratio (ref 0.0–5.0)
Cholesterol, Total: 160 mg/dL (ref 100–199)
HDL: 41 mg/dL (ref >39)
LDL Chol Calc (NIH): 94 mg/dL (ref 0–99)
Triglycerides: 140 mg/dL (ref 0–149)
VLDL Cholesterol Cal: 25 mg/dL (ref 5–40)

## 2023-04-19 LAB — HEMOGLOBIN A1C
Est. average glucose Bld gHb Est-mCnc: 140 mg/dL
Hgb A1c MFr Bld: 6.5 % — ABNORMAL HIGH (ref 4.8–5.6)

## 2023-05-04 LAB — FECAL OCCULT BLOOD, IMMUNOCHEMICAL: Fecal Occult Bld: NEGATIVE

## 2023-05-05 ENCOUNTER — Ambulatory Visit: Payer: 59 | Admitting: Internal Medicine

## 2023-05-11 ENCOUNTER — Other Ambulatory Visit: Payer: Self-pay | Admitting: Internal Medicine

## 2023-05-11 DIAGNOSIS — E782 Mixed hyperlipidemia: Secondary | ICD-10-CM

## 2023-07-31 ENCOUNTER — Encounter: Payer: Self-pay | Admitting: Internal Medicine

## 2023-07-31 ENCOUNTER — Ambulatory Visit: Payer: 59 | Admitting: Internal Medicine

## 2023-07-31 VITALS — BP 128/72 | HR 68 | Ht 66.0 in | Wt 168.0 lb

## 2023-07-31 DIAGNOSIS — R7303 Prediabetes: Secondary | ICD-10-CM

## 2023-07-31 DIAGNOSIS — M1712 Unilateral primary osteoarthritis, left knee: Secondary | ICD-10-CM | POA: Insufficient documentation

## 2023-07-31 DIAGNOSIS — I10 Essential (primary) hypertension: Secondary | ICD-10-CM | POA: Diagnosis not present

## 2023-07-31 LAB — POCT GLYCOSYLATED HEMOGLOBIN (HGB A1C): Hemoglobin A1C: 6.4 % — AB (ref 4.0–5.6)

## 2023-07-31 NOTE — Progress Notes (Signed)
Date:  07/31/2023   Name:  [name]   DOB:  09-Jun-1961   MRN:  [id number]   Chief Complaint: Diabetes and Hypertension  Diabetes He presents for his follow-up diabetic visit. Diabetes type: prediabetes. His disease course has been stable. Pertinent negatives for hypoglycemia include no dizziness or headaches. Pertinent negatives for diabetes include no chest pain, no fatigue and no weakness.  Knee Pain  There was no injury mechanism. The pain is present in the left knee. The pain has been Fluctuating since onset.    Lab Results  Component Value Date   NA 137 04/18/2023   K 4.9 04/18/2023   CO2 21 04/18/2023   GLUCOSE 100 (H) 04/18/2023   BUN 13 04/18/2023   CREATININE 1.03 04/18/2023   CALCIUM 9.6 04/18/2023   EGFR 82 04/18/2023   GFRNONAA 76 10/23/2020   Lab Results  Component Value Date   CHOL 160 04/18/2023   HDL 41 04/18/2023   LDLCALC 94 04/18/2023   TRIG 140 04/18/2023   CHOLHDL 3.9 04/18/2023   Lab Results  Component Value Date   TSH 7.970 (H) 11/04/2022   Lab Results  Component Value Date   HGBA1C 6.4 (A) 07/31/2023   Lab Results  Component Value Date   WBC 7.1 11/04/2022   HGB 16.8 11/04/2022   HCT 51.2 (H) 11/04/2022   MCV 89 11/04/2022   PLT 261 11/04/2022   Lab Results  Component Value Date   ALT 51 (H) 04/18/2023   AST 29 04/18/2023   ALKPHOS 84 04/18/2023   BILITOT 0.6 04/18/2023   No results found for: "25OHVITD2", "25OHVITD3", "VD25OH"   Review of Systems  Constitutional:  Negative for fatigue and unexpected weight change.  HENT:  Negative for nosebleeds.   Eyes:  Negative for visual disturbance.  Respiratory:  Negative for cough, chest tightness, shortness of breath and wheezing.   Cardiovascular:  Negative for chest pain, palpitations and leg swelling.  Gastrointestinal:  Negative for abdominal pain, constipation and diarrhea.  Musculoskeletal:  Positive for arthralgias (left knee - popping and swelling).  Neurological:   Negative for dizziness, weakness, light-headedness and headaches.    Patient Active Problem List   Diagnosis Date Noted   Primary osteoarthritis of left knee 07/31/2023   Hypothyroidism (acquired) 03/06/2022   Prediabetes 11/04/2020   Carpal tunnel syndrome, bilateral 10/23/2020   Tobacco use disorder, severe, in sustained remission 10/16/2019   Gastroesophageal reflux disease 01/28/2019   Positive PPD, 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   Prostate cancer (HCC) 07/27/2018   Hyperlipidemia, mixed 01/27/2018   Nocturia 10/09/2017   Essential hypertension 10/09/2017    No Known Allergies  Past Surgical History:  Procedure Laterality Date   lung collapse  2013    Social History   Tobacco Use   Smoking status: Former    Current packs/day: 0.00    Average packs/day: 0.3 packs/day for 40.0 years (10.0 ttl pk-yrs)    Types: Cigarettes    Start date: 11/25/1977    Quit date: 11/25/2017    Years since quitting: 5.6   Smokeless tobacco: Never  Vaping Use   Vaping status: Never Used  Substance Use Topics   Alcohol use: No   Drug use: No     Medication list has been reviewed and updated.  Current Meds  Medication Sig   atorvastatin (LIPITOR) 20 MG tablet TAKE 1 TABLET BY MOUTH DAILY   levothyroxine (SYNTHROID) 75 MCG tablet Take 1 tablet (75 mcg total) by mouth daily.   olmesartan (  BENICAR) 20 MG tablet TAKE 1 TABLET(20 MG) BY MOUTH DAILY       07/31/2023   10:00 AM 04/18/2023   10:10 AM 11/04/2022    8:13 AM 07/15/2022    9:04 AM  GAD 7 : Generalized Anxiety Score  Nervous, Anxious, on Edge 0 0 0 0  Control/stop worrying 0 0 0 0  Worry too much - different things 0 0 0 0  Trouble relaxing 0 0 0 0  Restless 0 0 0 0  Easily annoyed or irritable 0 0 0 0  Afraid - awful might happen 0 0 0 0  Total GAD 7 Score 0 0 0 0  Anxiety Difficulty Not difficult at all Not difficult at all Not difficult at all Not difficult at all       07/31/2023    9:59 AM 04/18/2023   10:10 AM 11/04/2022     8:13 AM  Depression screen PHQ 2/9  Decreased Interest 0 0 0  Down, Depressed, Hopeless 0 0 0  PHQ - 2 Score 0 0 0  Altered sleeping 0 0 0  Tired, decreased energy 0 0 0  Change in appetite 0 0 0  Feeling bad or failure about yourself  0 0 0  Trouble concentrating 0 0 0  Moving slowly or fidgety/restless 0 0 0  Suicidal thoughts 0 0 0  PHQ-9 Score 0 0 0  Difficult doing work/chores Not difficult at all Not difficult at all Not difficult at all    BP Readings from Last 3 Encounters:  07/31/23 128/72  04/18/23 130/72  11/04/22 128/70    Physical Exam Vitals and nursing note reviewed.  Constitutional:      General: He is not in acute distress.    Appearance: He is well-developed.  HENT:     Head: Normocephalic and atraumatic.  Neck:     Vascular: No carotid bruit.  Cardiovascular:     Rate and Rhythm: Normal rate and regular rhythm.  Pulmonary:     Effort: Pulmonary effort is normal. No respiratory distress.     Breath sounds: No wheezing or rhonchi.  Musculoskeletal:        General: Swelling (left knee) present.     Cervical back: Normal range of motion.     Right lower leg: No edema.     Left lower leg: No edema.  Lymphadenopathy:     Cervical: No cervical adenopathy.  Skin:    General: Skin is warm and dry.     Findings: No rash.  Neurological:     Mental Status: He is alert and oriented to person, place, and time.  Psychiatric:        Mood and Affect: Mood normal.        Behavior: Behavior normal.     Wt Readings from Last 3 Encounters:  07/31/23 168 lb (76.2 kg)  04/18/23 166 lb 12.8 oz (75.7 kg)  11/04/22 164 lb (74.4 kg)    BP 128/72   Pulse 68   Ht 5\' 6"  (1.676 m)   Wt 168 lb (76.2 kg)   SpO2 97%   BMI 27.12 kg/m   Assessment and Plan:  Problem List Items Addressed This Visit       Unprioritized   Primary osteoarthritis of left knee    Recommend Tylenol as needed, brace and ice prn Consider imaging and SM referral if worsening       Prediabetes - Primary (Chronic)    Glucoses controlled with diet alone. Lab  Results  Component Value Date   HGBA1C 6.5 (H) 04/18/2023  A1C today = 6.4 Continue diet changes - specifically discussed sugar free beverages and reducing tortilla intake.       Relevant Orders   POCT glycosylated hemoglobin (Hb A1C) (Completed)   Essential hypertension (Chronic)    Normal exam with stable BP on olmesartan. No concerns or side effects to current medication. No change in regimen; continue low sodium diet.        No follow-ups on file.    Reubin Milan, MD [facility] [place]

## 2023-07-31 NOTE — Patient Instructions (Signed)
Take Tylenol 500 mg - 2 tabs three times a day for knee pain  Use a knee brace and ice the knee after work.

## 2023-07-31 NOTE — Assessment & Plan Note (Signed)
Recommend Tylenol as needed, brace and ice prn Consider imaging and SM referral if worsening

## 2023-07-31 NOTE — Assessment & Plan Note (Signed)
Normal exam with stable BP on olmesartan. No concerns or side effects to current medication. No change in regimen; continue low sodium diet.

## 2023-07-31 NOTE — Assessment & Plan Note (Addendum)
Glucoses controlled with diet alone. Lab Results  Component Value Date   HGBA1C 6.5 (H) 04/18/2023  A1C today = 6.4 Continue diet changes - specifically discussed sugar free beverages and reducing tortilla intake.

## 2023-08-07 ENCOUNTER — Ambulatory Visit: Payer: 59 | Admitting: Internal Medicine

## 2023-08-30 ENCOUNTER — Other Ambulatory Visit: Payer: Self-pay | Admitting: Internal Medicine

## 2023-08-30 DIAGNOSIS — E782 Mixed hyperlipidemia: Secondary | ICD-10-CM

## 2023-08-31 ENCOUNTER — Other Ambulatory Visit: Payer: Self-pay | Admitting: Internal Medicine

## 2023-08-31 DIAGNOSIS — E782 Mixed hyperlipidemia: Secondary | ICD-10-CM

## 2023-08-31 MED ORDER — ATORVASTATIN CALCIUM 20 MG PO TABS
ORAL_TABLET | ORAL | 1 refills | Status: DC
Start: 2023-08-31 — End: 2024-05-07

## 2023-09-26 ENCOUNTER — Other Ambulatory Visit: Payer: Self-pay | Admitting: Internal Medicine

## 2023-09-26 DIAGNOSIS — E039 Hypothyroidism, unspecified: Secondary | ICD-10-CM

## 2023-11-08 ENCOUNTER — Other Ambulatory Visit: Payer: Self-pay | Admitting: Internal Medicine

## 2023-11-08 ENCOUNTER — Ambulatory Visit (INDEPENDENT_AMBULATORY_CARE_PROVIDER_SITE_OTHER): Payer: 59 | Admitting: Internal Medicine

## 2023-11-08 ENCOUNTER — Encounter: Payer: Self-pay | Admitting: Internal Medicine

## 2023-11-08 VITALS — BP 116/68 | HR 68 | Ht 66.0 in | Wt 169.0 lb

## 2023-11-08 DIAGNOSIS — I10 Essential (primary) hypertension: Secondary | ICD-10-CM

## 2023-11-08 DIAGNOSIS — C61 Malignant neoplasm of prostate: Secondary | ICD-10-CM | POA: Diagnosis not present

## 2023-11-08 DIAGNOSIS — E782 Mixed hyperlipidemia: Secondary | ICD-10-CM | POA: Diagnosis not present

## 2023-11-08 DIAGNOSIS — E039 Hypothyroidism, unspecified: Secondary | ICD-10-CM

## 2023-11-08 DIAGNOSIS — Z0000 Encounter for general adult medical examination without abnormal findings: Secondary | ICD-10-CM | POA: Diagnosis not present

## 2023-11-08 NOTE — Assessment & Plan Note (Signed)
LDL is  Lab Results  Component Value Date   LDLCALC 94 04/18/2023    Current regimen is atorvastatin.  Tolerating medications well without issues.

## 2023-11-08 NOTE — Progress Notes (Signed)
Date:  11/08/2023   Name:  [name]   DOB:  05-12-1961   MRN:  [id number]   Chief Complaint: Annual Exam LINDAN [name] is a 62 y.o. male who presents today for his Complete Annual Exam. He feels well. He reports exercising none. He reports he is sleeping well.   Colonoscopy: FIT 04/2023 neg  Immunization History  Administered Date(s) Administered   Influenza-Unspecified 09/15/2017, 10/09/2021, 10/06/2022, 09/26/2023   Moderna Sars-Covid-2 Vaccination 07/26/2020, 08/26/2020   Pfizer(Comirnaty)Fall Seasonal Vaccine 12 years and older 11/04/2022   Health Maintenance Due  Topic Date Due   Pneumococcal Vaccine 70-67 Years old (1 of 2 - PCV) Never done   HIV Screening  Never done   DTaP/Tdap/Td (1 - Tdap) Never done   Zoster Vaccines- Shingrix (1 of 2) Never done   Colonoscopy  Never done   COVID-19 Vaccine (4 - 2023-24 season) 08/27/2023    Lab Results  Component Value Date   PSA1 4.8 (H) 11/04/2022   PSA1 4.8 (H) 03/04/2022   PSA1 5.0 (H) 10/29/2021    Hypertension This is a chronic problem. The problem is controlled. Pertinent negatives include no chest pain, headaches, palpitations or shortness of breath. Past treatments include angiotensin blockers. The current treatment provides significant improvement. There is no history of kidney disease, CAD/MI or CVA. Identifiable causes of hypertension include a thyroid problem.  Hyperlipidemia This is a chronic problem. The problem is controlled. Pertinent negatives include no chest pain, myalgias or shortness of breath. Current antihyperlipidemic treatment includes statins.  Thyroid Problem Presents for follow-up visit. Patient reports no anxiety, constipation, diaphoresis, diarrhea, fatigue or palpitations. The symptoms have been stable. His past medical history is significant for hyperlipidemia.    Review of Systems  Constitutional:  Negative for appetite change, chills, diaphoresis, fatigue and unexpected weight  change.  HENT:  Negative for hearing loss, tinnitus, trouble swallowing and voice change.   Eyes:  Negative for visual disturbance.  Respiratory:  Negative for choking, shortness of breath and wheezing.   Cardiovascular:  Negative for chest pain, palpitations and leg swelling.  Gastrointestinal:  Negative for abdominal pain, blood in stool, constipation and diarrhea.  Genitourinary:  Negative for decreased urine volume, difficulty urinating, dysuria, frequency, hematuria and urgency.  Musculoskeletal:  Negative for arthralgias, back pain and myalgias.  Skin:  Negative for color change and rash.  Neurological:  Negative for dizziness, syncope and headaches.  Hematological:  Negative for adenopathy.  Psychiatric/Behavioral:  Negative for dysphoric mood and sleep disturbance. The patient is not nervous/anxious.      Lab Results  Component Value Date   NA 137 04/18/2023   K 4.9 04/18/2023   CO2 21 04/18/2023   GLUCOSE 100 (H) 04/18/2023   BUN 13 04/18/2023   CREATININE 1.03 04/18/2023   CALCIUM 9.6 04/18/2023   EGFR 82 04/18/2023   GFRNONAA 76 10/23/2020   Lab Results  Component Value Date   CHOL 160 04/18/2023   HDL 41 04/18/2023   LDLCALC 94 04/18/2023   TRIG 140 04/18/2023   CHOLHDL 3.9 04/18/2023   Lab Results  Component Value Date   TSH 7.970 (H) 11/04/2022   Lab Results  Component Value Date   HGBA1C 6.4 (A) 07/31/2023   Lab Results  Component Value Date   WBC 7.1 11/04/2022   HGB 16.8 11/04/2022   HCT 51.2 (H) 11/04/2022   MCV 89 11/04/2022   PLT 261 11/04/2022   Lab Results  Component Value Date   ALT  51 (H) 04/18/2023   AST 29 04/18/2023   ALKPHOS 84 04/18/2023   BILITOT 0.6 04/18/2023   No results found for: "25OHVITD2", "25OHVITD3", "VD25OH"   Patient Active Problem List   Diagnosis Date Noted   Primary osteoarthritis of left knee 07/31/2023   Hypothyroidism (acquired) 03/06/2022   Prediabetes 11/04/2020   Carpal tunnel syndrome, bilateral  10/23/2020   Tobacco use disorder, severe, in sustained remission 10/16/2019   Gastroesophageal reflux disease 01/28/2019   Positive PPD, 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   Prostate cancer (HCC) 07/27/2018   Hyperlipidemia, mixed 01/27/2018   Nocturia 10/09/2017   Essential hypertension 10/09/2017    No Known Allergies  Past Surgical History:  Procedure Laterality Date   lung collapse  2013    Social History   Tobacco Use   Smoking status: Former    Current packs/day: 0.00    Average packs/day: 0.3 packs/day for 40.0 years (10.0 ttl pk-yrs)    Types: Cigarettes    Start date: 11/25/1977    Quit date: 11/25/2017    Years since quitting: 5.9   Smokeless tobacco: Never  Vaping Use   Vaping status: Never Used  Substance Use Topics   Alcohol use: No   Drug use: No     Medication list has been reviewed and updated.  Current Meds  Medication Sig   atorvastatin (LIPITOR) 20 MG tablet TAKE 1 TABLET BY MOUTH DAILY   levothyroxine (SYNTHROID) 75 MCG tablet TAKE 1 TABLET(75 MCG) BY MOUTH DAILY   olmesartan (BENICAR) 20 MG tablet TAKE 1 TABLET(20 MG) BY MOUTH DAILY       11/08/2023    8:48 AM 07/31/2023   10:00 AM 04/18/2023   10:10 AM 11/04/2022    8:13 AM  GAD 7 : Generalized Anxiety Score  Nervous, Anxious, on Edge 0 0 0 0  Control/stop worrying 0 0 0 0  Worry too much - different things 0 0 0 0  Trouble relaxing 0 0 0 0  Restless 0 0 0 0  Easily annoyed or irritable 0 0 0 0  Afraid - awful might happen 0 0 0 0  Total GAD 7 Score 0 0 0 0  Anxiety Difficulty Not difficult at all Not difficult at all Not difficult at all Not difficult at all       11/08/2023    8:48 AM 07/31/2023    9:59 AM 04/18/2023   10:10 AM  Depression screen PHQ 2/9  Decreased Interest 0 0 0  Down, Depressed, Hopeless 0 0 0  PHQ - 2 Score 0 0 0  Altered sleeping 0 0 0  Tired, decreased energy 0 0 0  Change in appetite 0 0 0  Feeling bad or failure about yourself  0 0 0  Trouble concentrating 0 0 0   Moving slowly or fidgety/restless 0 0 0  Suicidal thoughts 0 0 0  PHQ-9 Score 0 0 0  Difficult doing work/chores Not difficult at all Not difficult at all Not difficult at all    BP Readings from Last 3 Encounters:  11/08/23 116/68  07/31/23 128/72  04/18/23 130/72    Physical Exam Vitals and nursing note reviewed.  Constitutional:      Appearance: Normal appearance. He is well-developed.  HENT:     Head: Normocephalic.     Right Ear: Tympanic membrane, ear canal and external ear normal.     Left Ear: Tympanic membrane, ear canal and external ear normal.     Nose: Nose normal.  Eyes:  Conjunctiva/sclera: Conjunctivae normal.     Pupils: Pupils are equal, round, and reactive to light.  Neck:     Thyroid: No thyromegaly.     Vascular: No carotid bruit.  Cardiovascular:     Rate and Rhythm: Normal rate and regular rhythm.     Heart sounds: Normal heart sounds.  Pulmonary:     Effort: Pulmonary effort is normal.     Breath sounds: Normal breath sounds. No wheezing.  Chest:  Breasts:    Right: No mass.     Left: No mass.  Abdominal:     General: Bowel sounds are normal.     Palpations: Abdomen is soft.     Tenderness: There is no abdominal tenderness.  Musculoskeletal:        General: Normal range of motion.     Cervical back: Normal range of motion and neck supple.  Lymphadenopathy:     Cervical: No cervical adenopathy.  Skin:    General: Skin is warm and dry.     Capillary Refill: Capillary refill takes less than 2 seconds.  Neurological:     General: No focal deficit present.     Mental Status: He is alert and oriented to person, place, and time.     Deep Tendon Reflexes: Reflexes are normal and symmetric.  Psychiatric:        Attention and Perception: Attention normal.        Mood and Affect: Mood normal.        Thought Content: Thought content normal.     Wt Readings from Last 3 Encounters:  11/08/23 169 lb (76.7 kg)  07/31/23 168 lb (76.2 kg)   04/18/23 166 lb 12.8 oz (75.7 kg)    BP 116/68   Pulse 68   Ht 5\' 6"  (1.676 m)   Wt 169 lb (76.7 kg)   SpO2 98%   BMI 27.28 kg/m   Assessment and Plan:  Problem List Items Addressed This Visit       Unprioritized   Essential hypertension (Chronic)    Controlled BP with normal exam. Current regimen is olmesartan. Will continue same medications; encourage continued reduced sodium diet.       Relevant Orders   CBC with Differential/Platelet   Comprehensive metabolic panel   Urinalysis, Routine w reflex microscopic   Hyperlipidemia, mixed (Chronic)    LDL is  Lab Results  Component Value Date   LDLCALC 94 04/18/2023    Current regimen is atorvastatin.  Tolerating medications well without issues.       Relevant Orders   Lipid panel   Prostate cancer (HCC) (Chronic)    Seen by Urology but not in some time. He is reluctant to go back due to side effects from the prostate biopsy (dark blood in his semen for 4-5 mo afterwards) Last PSA was stable.  If higher, will refer back to Urology.  Patient reassured he will not likely need another biopsy.      Relevant Orders   PSA   Hypothyroidism (acquired) (Chronic)    Supplemented. Lab Results  Component Value Date   TSH 7.970 (H) 11/04/2022   T3TOTAL 107 10/23/2020   T4TOTAL 4.7 10/23/2020         Relevant Orders   TSH + free T4   Other Visit Diagnoses     Annual physical exam    -  Primary   Relevant Orders   CBC with Differential/Platelet   Comprehensive metabolic panel   Lipid panel   PSA  TSH + free T4       Return in about 6 months (around 05/07/2024) for HTN.    Reubin Milan, MD [facility] [place]

## 2023-11-08 NOTE — Assessment & Plan Note (Signed)
Supplemented. Lab Results  Component Value Date   TSH 7.970 (H) 11/04/2022   T3TOTAL 107 10/23/2020   T4TOTAL 4.7 10/23/2020

## 2023-11-08 NOTE — Assessment & Plan Note (Signed)
Controlled BP with normal exam. Current regimen is olmesartan. Will continue same medications; encourage continued reduced sodium diet.

## 2023-11-08 NOTE — Assessment & Plan Note (Addendum)
Seen by Urology but not in some time. He is reluctant to go back due to side effects from the prostate biopsy (dark blood in his semen for 4-5 mo afterwards) Last PSA was stable.  If higher, will refer back to Urology.  Patient reassured he will not likely need another biopsy.

## 2023-11-09 LAB — LIPID PANEL
Chol/HDL Ratio: 4.2 ratio (ref 0.0–5.0)
Cholesterol, Total: 174 mg/dL (ref 100–199)
HDL: 41 mg/dL (ref >39)
LDL Chol Calc (NIH): 103 mg/dL — ABNORMAL HIGH (ref 0–99)
Triglycerides: 172 mg/dL — ABNORMAL HIGH (ref 0–149)
VLDL Cholesterol Cal: 30 mg/dL (ref 5–40)

## 2023-11-09 LAB — TSH+FREE T4
Free T4: 1.26 ng/dL (ref 0.82–1.77)
TSH: 4.72 u[IU]/mL — ABNORMAL HIGH (ref 0.450–4.500)

## 2023-11-09 LAB — CBC WITH DIFFERENTIAL/PLATELET
Basophils Absolute: 0.1 10*3/uL (ref 0.0–0.2)
Basos: 1 %
EOS (ABSOLUTE): 0.4 10*3/uL (ref 0.0–0.4)
Eos: 4 %
Hematocrit: 52.4 % — ABNORMAL HIGH (ref 37.5–51.0)
Hemoglobin: 17 g/dL (ref 13.0–17.7)
Immature Grans (Abs): 0.1 10*3/uL (ref 0.0–0.1)
Immature Granulocytes: 1 %
Lymphocytes Absolute: 2.9 10*3/uL (ref 0.7–3.1)
Lymphs: 34 %
MCH: 29.8 pg (ref 26.6–33.0)
MCHC: 32.4 g/dL (ref 31.5–35.7)
MCV: 92 fL (ref 79–97)
Monocytes Absolute: 0.6 10*3/uL (ref 0.1–0.9)
Monocytes: 7 %
Neutrophils Absolute: 4.6 10*3/uL (ref 1.4–7.0)
Neutrophils: 53 %
Platelets: 284 10*3/uL (ref 150–450)
RBC: 5.71 x10E6/uL (ref 4.14–5.80)
RDW: 13.2 % (ref 11.6–15.4)
WBC: 8.6 10*3/uL (ref 3.4–10.8)

## 2023-11-09 LAB — URINALYSIS, ROUTINE W REFLEX MICROSCOPIC
Bilirubin, UA: NEGATIVE
Glucose, UA: NEGATIVE
Ketones, UA: NEGATIVE
Leukocytes,UA: NEGATIVE
Nitrite, UA: NEGATIVE
Protein,UA: NEGATIVE
RBC, UA: NEGATIVE
Specific Gravity, UA: 1.017 (ref 1.005–1.030)
Urobilinogen, Ur: 0.2 mg/dL (ref 0.2–1.0)
pH, UA: 5.5 (ref 5.0–7.5)

## 2023-11-09 LAB — COMPREHENSIVE METABOLIC PANEL
ALT: 55 [IU]/L — ABNORMAL HIGH (ref 0–44)
AST: 28 [IU]/L (ref 0–40)
Albumin: 4.5 g/dL (ref 3.9–4.9)
Alkaline Phosphatase: 87 [IU]/L (ref 44–121)
BUN/Creatinine Ratio: 17 (ref 10–24)
BUN: 19 mg/dL (ref 8–27)
Bilirubin Total: 0.5 mg/dL (ref 0.0–1.2)
CO2: 19 mmol/L — ABNORMAL LOW (ref 20–29)
Calcium: 9.7 mg/dL (ref 8.6–10.2)
Chloride: 102 mmol/L (ref 96–106)
Creatinine, Ser: 1.12 mg/dL (ref 0.76–1.27)
Globulin, Total: 3.2 g/dL (ref 1.5–4.5)
Glucose: 102 mg/dL — ABNORMAL HIGH (ref 70–99)
Potassium: 4.7 mmol/L (ref 3.5–5.2)
Sodium: 138 mmol/L (ref 134–144)
Total Protein: 7.7 g/dL (ref 6.0–8.5)
eGFR: 73 mL/min/{1.73_m2} (ref >59)

## 2023-11-09 LAB — PSA: Prostate Specific Ag, Serum: 5.4 ng/mL — ABNORMAL HIGH (ref 0.0–4.0)

## 2024-01-31 ENCOUNTER — Other Ambulatory Visit: Payer: Self-pay | Admitting: Internal Medicine

## 2024-01-31 DIAGNOSIS — I10 Essential (primary) hypertension: Secondary | ICD-10-CM

## 2024-03-03 ENCOUNTER — Other Ambulatory Visit: Payer: Self-pay | Admitting: Internal Medicine

## 2024-03-03 DIAGNOSIS — E039 Hypothyroidism, unspecified: Secondary | ICD-10-CM

## 2024-03-05 NOTE — Telephone Encounter (Signed)
 Requested Prescriptions  Pending Prescriptions Disp Refills   levothyroxine (SYNTHROID) 75 MCG tablet [Pharmacy Med Name: LEVOTHYROXINE 0.075MG  ( ) TABS] 90 tablet 0    Sig: TAKE 1 TABLET(75 MCG) BY MOUTH DAILY     Endocrinology:  Hypothyroid Agents Failed - 03/05/2024 10:25 AM      Failed - TSH in normal range and within 360 days    TSH  Date Value Ref Range Status  11/08/2023 4.720 (H) 0.450 - 4.500 uIU/mL Final         Passed - Valid encounter within last 12 months    Recent Outpatient Visits           3 months ago Annual physical exam   [place] [facility] at [facility], Nyoka Cowden, MD   7 months ago Prediabetes   [facility] at [facility], Nyoka Cowden, MD   10 months ago Essential hypertension   [place] [facility] at [facility], Nyoka Cowden, MD   1 year ago Annual physical exam   [facility] at [facility], Nyoka Cowden, MD   1 year ago Essential hypertension   [facility] at [facility], Nyoka Cowden, MD       Future Appointments             In 2 months Judithann Graves, Nyoka Cowden, MD [facility] Medicine at [facility], [facility]   In 8 months Judithann Graves, Nyoka Cowden, MD [facility] Primary Care & Sports Medicine at [facility], [facility]

## 2024-05-07 ENCOUNTER — Ambulatory Visit: Payer: Self-pay | Admitting: Internal Medicine

## 2024-05-07 ENCOUNTER — Encounter: Payer: Self-pay | Admitting: Internal Medicine

## 2024-05-07 VITALS — BP 118/72 | HR 79 | Ht 66.0 in | Wt 167.1 lb

## 2024-05-07 DIAGNOSIS — C61 Malignant neoplasm of prostate: Secondary | ICD-10-CM | POA: Diagnosis not present

## 2024-05-07 DIAGNOSIS — E039 Hypothyroidism, unspecified: Secondary | ICD-10-CM | POA: Diagnosis not present

## 2024-05-07 DIAGNOSIS — R7303 Prediabetes: Secondary | ICD-10-CM | POA: Diagnosis not present

## 2024-05-07 DIAGNOSIS — Z1211 Encounter for screening for malignant neoplasm of colon: Secondary | ICD-10-CM

## 2024-05-07 DIAGNOSIS — I10 Essential (primary) hypertension: Secondary | ICD-10-CM

## 2024-05-07 DIAGNOSIS — E782 Mixed hyperlipidemia: Secondary | ICD-10-CM

## 2024-05-07 MED ORDER — ATORVASTATIN CALCIUM 20 MG PO TABS: ORAL_TABLET | ORAL | 1 refills | Status: DC

## 2024-05-07 NOTE — Assessment & Plan Note (Signed)
 He denies any urological symptoms No blood in the urine or hesitancy Will repeat PSA and if higher, refer back to Urology. Lab Results  Component Value Date   PSA1 5.4 (H) 11/08/2023   PSA1 4.8 (H) 11/04/2022   PSA1 4.8 (H) 03/04/2022

## 2024-05-07 NOTE — Assessment & Plan Note (Signed)
 Blood pressure is well controlled.  Current medications are olmesartan . Will continue same regimen along with efforts to limit dietary sodium.

## 2024-05-07 NOTE — Assessment & Plan Note (Signed)
 Managed with diet - reminded to limit carbohydrates and sweets Weight is stable.

## 2024-05-07 NOTE — Progress Notes (Signed)
 Date:  05/07/2024   Name:  [name]   DOB:  1961/06/20   MRN:  [id number]   Chief Complaint: Hypertension  Hypertension This is a chronic problem. The problem is controlled. Pertinent negatives include no chest pain, headaches, palpitations or shortness of breath. Past treatments include angiotensin blockers. The current treatment provides significant improvement. Identifiable causes of hypertension include a thyroid problem.  Thyroid Problem Presents for follow-up visit. Patient reports no anxiety, constipation, diarrhea, fatigue or palpitations. The symptoms have been stable.  Prostate cancer - seen in the past by urology; opted for active surveillance.  Last PSA here was slightly higher.  Will repeat today.  He is not sure when he last saw Urology.  He denies any urinary issues.  Review of Systems  Constitutional:  Negative for fatigue and unexpected weight change.  Eyes:  Negative for visual disturbance.  Respiratory:  Negative for cough, chest tightness, shortness of breath and wheezing.   Cardiovascular:  Negative for chest pain, palpitations and leg swelling.  Gastrointestinal:  Negative for abdominal pain, constipation and diarrhea.  Genitourinary:  Negative for difficulty urinating, hematuria and urgency.  Musculoskeletal:  Negative for arthralgias and joint swelling.  Neurological:  Negative for dizziness, weakness, light-headedness and headaches.  Psychiatric/Behavioral:  Negative for dysphoric mood and sleep disturbance. The patient is not nervous/anxious.      Lab Results  Component Value Date   NA 138 11/08/2023   K 4.7 11/08/2023   CO2 19 (L) 11/08/2023   GLUCOSE 102 (H) 11/08/2023   BUN 19 11/08/2023   CREATININE 1.12 11/08/2023   CALCIUM  9.7 11/08/2023   EGFR 73 11/08/2023   GFRNONAA 76 10/23/2020   Lab Results  Component Value Date   CHOL 174 11/08/2023   HDL 41 11/08/2023   LDLCALC 103 (H) 11/08/2023   TRIG 172 (H) 11/08/2023   CHOLHDL  4.2 11/08/2023   Lab Results  Component Value Date   TSH 4.720 (H) 11/08/2023   Lab Results  Component Value Date   HGBA1C 6.4 (A) 07/31/2023   Lab Results  Component Value Date   WBC 8.6 11/08/2023   HGB 17.0 11/08/2023   HCT 52.4 (H) 11/08/2023   MCV 92 11/08/2023   PLT 284 11/08/2023   Lab Results  Component Value Date   ALT 55 (H) 11/08/2023   AST 28 11/08/2023   ALKPHOS 87 11/08/2023   BILITOT 0.5 11/08/2023   No results found for: "25OHVITD2", "25OHVITD3", "VD25OH"   Patient Active Problem List   Diagnosis Date Noted   Primary osteoarthritis of left knee 07/31/2023   Hypothyroidism (acquired) 03/06/2022   Prediabetes 11/04/2020   Carpal tunnel syndrome, bilateral 10/23/2020   Tobacco use disorder, severe, in sustained remission 10/16/2019   Gastroesophageal reflux disease 01/28/2019   Positive PPD, 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   Prostate cancer (HCC) 07/27/2018   Hyperlipidemia, mixed 01/27/2018   Nocturia 10/09/2017   Essential hypertension 10/09/2017    No Known Allergies  Past Surgical History:  Procedure Laterality Date   lung collapse  2013    Social History   Tobacco Use   Smoking status: Former    Current packs/day: 0.00    Average packs/day: 0.3 packs/day for 40.0 years (10.0 ttl pk-yrs)    Types: Cigarettes    Start date: 11/25/1977    Quit date: 11/25/2017    Years since quitting: 6.4   Smokeless tobacco: Never  Vaping Use   Vaping status: Never Used  Substance Use Topics   Alcohol  use: No   Drug use: No     Medication list has been reviewed and updated.  Current Meds  Medication Sig   levothyroxine  (SYNTHROID ) 75 MCG tablet TAKE 1 TABLET(75 MCG) BY MOUTH DAILY   olmesartan  (BENICAR ) 20 MG tablet TAKE 1 TABLET(20 MG) BY MOUTH DAILY   [DISCONTINUED] atorvastatin  (LIPITOR) 20 MG tablet TAKE 1 TABLET BY MOUTH DAILY       05/07/2024    9:43 AM 11/08/2023    8:48 AM 07/31/2023   10:00 AM 04/18/2023   10:10 AM  GAD 7 : Generalized Anxiety  Score  Nervous, Anxious, on Edge 0 0 0 0  Control/stop worrying 0 0 0 0  Worry too much - different things 0 0 0 0  Trouble relaxing 0 0 0 0  Restless 0 0 0 0  Easily annoyed or irritable 0 0 0 0  Afraid - awful might happen 0 0 0 0  Total GAD 7 Score 0 0 0 0  Anxiety Difficulty Not difficult at all Not difficult at all Not difficult at all Not difficult at all       05/07/2024    9:43 AM 11/08/2023    8:48 AM 07/31/2023    9:59 AM  Depression screen PHQ 2/9  Decreased Interest 0 0 0  Down, Depressed, Hopeless 0 0 0  PHQ - 2 Score 0 0 0  Altered sleeping 0 0 0  Tired, decreased energy 0 0 0  Change in appetite 0 0 0  Feeling bad or failure about yourself  0 0 0  Trouble concentrating 0 0 0  Moving slowly or fidgety/restless 0 0 0  Suicidal thoughts 0 0 0  PHQ-9 Score 0 0 0  Difficult doing work/chores Not difficult at all Not difficult at all Not difficult at all    BP Readings from Last 3 Encounters:  05/07/24 118/72  11/08/23 116/68  07/31/23 128/72    Physical Exam Vitals and nursing note reviewed.  Constitutional:      General: He is not in acute distress.    Appearance: He is well-developed.  HENT:     Head: Normocephalic and atraumatic.  Neck:     Vascular: No carotid bruit.  Cardiovascular:     Rate and Rhythm: Normal rate and regular rhythm.     Heart sounds: No murmur heard. Pulmonary:     Effort: Pulmonary effort is normal. No respiratory distress.     Breath sounds: No wheezing or rhonchi.  Musculoskeletal:     Cervical back: Normal range of motion.     Right lower leg: No edema.     Left lower leg: No edema.  Lymphadenopathy:     Cervical: No cervical adenopathy.  Skin:    General: Skin is warm and dry.     Capillary Refill: Capillary refill takes less than 2 seconds.     Findings: No rash.  Neurological:     Mental Status: He is alert and oriented to person, place, and time.  Psychiatric:        Mood and Affect: Mood normal.         Behavior: Behavior normal.     Wt Readings from Last 3 Encounters:  05/07/24 167 lb 2 oz (75.8 kg)  11/08/23 169 lb (76.7 kg)  07/31/23 168 lb (76.2 kg)    BP 118/72   Pulse 79   Ht 5\' 6"  (1.676 m)   Wt 167 lb 2 oz (75.8 kg)   SpO2 98%  BMI 26.97 kg/m   Assessment and Plan:  Problem List Items Addressed This Visit       Unprioritized   Essential hypertension - Primary (Chronic)   Blood pressure is well controlled.  Current medications are olmesartan . Will continue same regimen along with efforts to limit dietary sodium.       Relevant Medications   atorvastatin  (LIPITOR) 20 MG tablet   Hyperlipidemia, mixed (Chronic)   Relevant Medications   atorvastatin  (LIPITOR) 20 MG tablet   Prostate cancer (HCC) (Chronic)   He denies any urological symptoms No blood in the urine or hesitancy Will repeat PSA and if higher, refer back to Urology. Lab Results  Component Value Date   PSA1 5.4 (H) 11/08/2023   PSA1 4.8 (H) 11/04/2022   PSA1 4.8 (H) 03/04/2022          Relevant Orders   PSA   Prediabetes (Chronic)   Managed with diet - reminded to limit carbohydrates and sweets Weight is stable.        Relevant Orders   Hemoglobin A1c   Hypothyroidism (acquired) (Chronic)   Supplemented with slightly high TSH. Will repeat labs and adjust dose if needed      Relevant Orders   TSH + free T4   Other Visit Diagnoses       Colon cancer screening       Relevant Orders   Fecal occult blood, imunochemical       Return in about 6 months (around 11/07/2024) for CPX.    Sheron Dixons, MD [facility] [place]

## 2024-05-07 NOTE — Assessment & Plan Note (Signed)
 Supplemented with slightly high TSH. Will repeat labs and adjust dose if needed

## 2024-05-08 ENCOUNTER — Ambulatory Visit: Payer: Self-pay | Admitting: Internal Medicine

## 2024-05-08 LAB — PSA: Prostate Specific Ag, Serum: 5.4 ng/mL — ABNORMAL HIGH (ref 0.0–4.0)

## 2024-05-08 LAB — TSH+FREE T4
Free T4: 1.03 ng/dL (ref 0.82–1.77)
TSH: 6.74 u[IU]/mL — ABNORMAL HIGH (ref 0.450–4.500)

## 2024-05-08 LAB — HEMOGLOBIN A1C
Est. average glucose Bld gHb Est-mCnc: 143 mg/dL
Hgb A1c MFr Bld: 6.6 % — ABNORMAL HIGH (ref 4.8–5.6)

## 2024-05-16 LAB — FECAL OCCULT BLOOD, IMMUNOCHEMICAL: Fecal Occult Bld: POSITIVE — AB

## 2024-06-05 ENCOUNTER — Other Ambulatory Visit: Payer: Self-pay | Admitting: Internal Medicine

## 2024-06-05 DIAGNOSIS — E039 Hypothyroidism, unspecified: Secondary | ICD-10-CM

## 2024-06-05 NOTE — Telephone Encounter (Signed)
 Copied from CRM #[id number]. Topic: Clinical - Medication Refill >> Jun 05, 2024  3:54 PM Rosaria Common wrote: Medication: levothyroxine  (SYNTHROID ) 75 MCG tablet  Has the patient contacted their pharmacy? Yes (Agent: If no, request that the patient contact the pharmacy for the refill. If patient does not wish to contact the pharmacy document the reason why and proceed with request.) (Agent: If yes, when and what did the pharmacy advise?)  This is the patient's preferred pharmacy:  [id number] [place] - [address] AT [facility] OF [address] [place] [address] [place] [address] Phone: [phone] Fax: [phone]  Is this the correct pharmacy for this prescription? Yes If no, delete pharmacy and type the correct one.   Has the prescription been filled recently? No  Is the patient out of the medication? No  Has the patient been seen for an appointment in the last year OR does the patient have an upcoming appointment? Yes  Can we respond through MyChart? No  Agent: Please be advised that Rx refills may take up to 3 business days. We ask that you follow-up with your pharmacy.

## 2024-06-07 MED ORDER — LEVOTHYROXINE SODIUM 75 MCG PO TABS: 75.0000 ug | ORAL_TABLET | Freq: Every day | ORAL | 0 refills | Status: DC

## 2024-06-07 NOTE — Telephone Encounter (Signed)
 Requested medication (s) are due for refill today: yes  Requested medication (s) are on the active medication list: yes   Last refill:  03/05/24 #90 0 refills  Future visit scheduled: yes in 5 months  11/11/24  Notes to clinic:  do you want to refill Rx same dose?      Requested Prescriptions  Pending Prescriptions Disp Refills   levothyroxine  (SYNTHROID ) 75 MCG tablet 90 tablet 0     Endocrinology:  Hypothyroid Agents Failed - 06/07/2024 11:29 AM      Failed - TSH in normal range and within 360 days    TSH  Date Value Ref Range Status  05/07/2024 6.740 (H) 0.450 - 4.500 uIU/mL Final         Passed - Valid encounter within last 12 months    Recent Outpatient Visits           1 month ago Essential hypertension   [facility] at [facility], Chales [place], MD       Future Appointments             In 5 months Gala Jubilee, Chales [place], MD [facility] Primary Care & [facility] Medicine at [facility], [facility]

## 2024-08-01 ENCOUNTER — Other Ambulatory Visit: Payer: Self-pay | Admitting: Internal Medicine

## 2024-08-01 DIAGNOSIS — I10 Essential (primary) hypertension: Secondary | ICD-10-CM

## 2024-08-01 NOTE — Telephone Encounter (Signed)
 Copied from CRM #[id number]. Topic: Clinical - Medication Refill >> Aug 01, 2024  9:58 AM Rosaria E wrote: Medication: olmesartan  (BENICAR ) 20 MG tablet  Has the patient contacted their pharmacy? Yes (Agent: If no, request that the patient contact the pharmacy for the refill. If patient does not wish to contact the pharmacy document the reason why and proceed with request.) (Agent: If yes, when and what did the pharmacy advise?)  This is the patient's preferred pharmacy:  [facility] #[id number], [place] - [address] AT [facility] OF [address] [place] [address] Phone: [phone] Fax: [phone]  Is this the correct pharmacy for this prescription? Yes If no, delete pharmacy and type the correct one.   Has the prescription been filled recently? Yes  Is the patient out of the medication? No  Has the patient been seen for an appointment in the last year OR does the patient have an upcoming appointment? Yes  Can we respond through MyChart? Yes  Agent: Please be advised that Rx refills may take up to 3 business days. We ask that you follow-up with your pharmacy.

## 2024-08-03 MED ORDER — OLMESARTAN MEDOXOMIL 20 MG PO TABS: ORAL_TABLET | ORAL | 0 refills | Status: DC

## 2024-08-03 NOTE — Telephone Encounter (Signed)
 Requested Prescriptions  Pending Prescriptions Disp Refills   olmesartan  (BENICAR ) 20 MG tablet 90 tablet 0    Sig: TAKE 1 TABLET(20 MG) BY MOUTH DAILY     Cardiovascular:  Angiotensin Receptor Blockers Failed - 08/03/2024  9:27 PM      Failed - Cr in normal range and within 180 days    Creatinine, Ser  Date Value Ref Range Status  11/08/2023 1.12 0.76 - 1.27 mg/dL Final         Failed - K in normal range and within 180 days    Potassium  Date Value Ref Range Status  11/08/2023 4.7 3.5 - 5.2 mmol/L Final         Passed - Patient is not pregnant      Passed - Last BP in normal range    BP Readings from Last 1 Encounters:  05/07/24 118/72         Passed - Valid encounter within last 6 months    Recent Outpatient Visits           2 months ago Essential hypertension   [facility], Leita DEL, MD       Future Appointments             In 3 months Justus, Leita DEL, MD [facility] at [facility], [facility]

## 2024-08-29 ENCOUNTER — Ambulatory Visit: Admission: EM | Admit: 2024-08-29 | Discharge: 2024-08-29 | Disposition: A | Discharge status: Home / Self Care

## 2024-08-29 DIAGNOSIS — W19XXXA Unspecified fall, initial encounter: Secondary | ICD-10-CM | POA: Diagnosis not present

## 2024-08-29 DIAGNOSIS — R030 Elevated blood-pressure reading, without diagnosis of hypertension: Secondary | ICD-10-CM | POA: Diagnosis not present

## 2024-08-29 DIAGNOSIS — S0990XA Unspecified injury of head, initial encounter: Secondary | ICD-10-CM | POA: Diagnosis not present

## 2024-08-29 DIAGNOSIS — M545 Low back pain, unspecified: Secondary | ICD-10-CM

## 2024-08-29 NOTE — ED Notes (Signed)
 Patient is being discharged from the Urgent Care and sent to the Emergency Department via Personal Vehicle . Per Rilla, NP, patient is in need of higher level of care due to Fall. Patient is aware and verbalizes understanding of plan of care.  Vitals:   08/29/24 0855  BP: (!) 148/85  Pulse: 78  Resp: 16  Temp: 98.1 F (36.7 C)  SpO2: 93%

## 2024-08-29 NOTE — Discharge Instructions (Addendum)
 Do not eat or drink anything, until evaluated by ER provider,  for fall 12 feet off shed with +LOC, +nausea since, back pain/pelvis pain

## 2024-08-29 NOTE — ED Provider Notes (Signed)
 [facility]    CSN: [id number] Arrival date & time: 08/29/24  0802      History   Chief Complaint Chief Complaint  Patient presents with   Fall    HPI [name] is a 63 y.o. male.   63 year old male pt, [name], presents to urgent care with wife for evaluation of unwitnessed fall on 08/25/24 off of 12 foot tall shed, pt states piece of lumbar broke he was standing on and he fell, + LOC everything went black, +nausea since, + back pain. Pt denies urinary issues. Pt is standing due to pain, points to hips/pelvis, lower back, hurts to breathe. Pt rates pain as constant 9/10, No treatment tried. GCS 15.  PMH: HTN, tobacco use,hypothyroid, hyperlipidemia  The history is provided by the patient. No language interpreter was used.    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 Authorizing Provider  atorvastatin  (LIPITOR) 20 MG tablet TAKE 1 TABLET BY MOUTH DAILY 05/07/24  Yes Justus Leita DEL, MD  levothyroxine  (SYNTHROID ) 75 MCG tablet Take 1 tablet (75 mcg total) by mouth daily  before breakfast. 06/07/24  Yes Justus Leita DEL, MD  olmesartan  (BENICAR ) 20 MG tablet TAKE 1 TABLET(20 MG) BY MOUTH DAILY 08/03/24  Yes Justus Leita DEL, MD    Family History Family History  Problem Relation Age of Onset   Prostate cancer Father 68   Heart attack Sister    Diabetes Brother     Social History Social History   Tobacco Use   Smoking status: Former    Current packs/day: 0.00    Average packs/day: 0.3 packs/day for 40.0 years (10.0 ttl pk-yrs)    Types: Cigarettes    Start date: 11/25/1977    Quit date: 11/25/2017    Years since quitting: 6.7   Smokeless tobacco: Never  Vaping Use   Vaping status: Never Used  Substance Use Topics   Alcohol use: No   Drug use: No     Allergies   Patient has no known allergies.   Review of Systems Review of Systems  Constitutional:  Negative for fever.  Gastrointestinal:  Positive for nausea. Negative for abdominal pain and vomiting.  Genitourinary:  Negative for dysuria.  Musculoskeletal:  Positive for arthralgias, back pain, gait problem and myalgias.  Skin:  Positive for wound.  Neurological:        +LOC with recent fall/head injury  All other systems reviewed and are negative.    Physical Exam Triage Vital Signs ED Triage Vitals  Encounter Vitals Group     BP  Girls Systolic BP Percentile      Girls Diastolic BP Percentile      Boys Systolic BP Percentile      Boys Diastolic BP Percentile      Pulse      Resp      Temp      Temp src      SpO2      Weight      Height      Head Circumference      Peak Flow      Pain Score      Pain Loc      Pain Education      Exclude from Growth Chart    No data found.  Updated Vital Signs BP (!) 148/85 (BP Location: Left Arm)   Pulse 78   Temp 98.1 F (36.7 C) (Oral)   Resp 16   Wt 166 lb 3.2 oz (75.4 kg)   SpO2 93%   BMI 26.83 kg/m   Visual Acuity Right Eye Distance:   Left Eye Distance:   Bilateral Distance:    Right Eye Near:   Left Eye  Near:    Bilateral Near:     Physical Exam Vitals and nursing note reviewed.  Eyes:     General: Vision grossly intact.     Extraocular Movements: Extraocular movements intact.     Pupils: Pupils are equal, round, and reactive to light.  Cardiovascular:     Rate and Rhythm: Normal rate and regular rhythm.     Heart sounds: Normal heart sounds.  Pulmonary:     Comments: Shallow respirations, sat 93% on RA, no wheezing or stridor Skin:    Comments: Abrasion to back noted  Neurological:     General: No focal deficit present.     Mental Status: He is alert and oriented to person, place, and time.     GCS: GCS eye subscore is 4. GCS verbal subscore is 5. GCS motor subscore is 6.     Comments: MAE x4  Psychiatric:        Attention and Perception: Attention normal.        Mood and Affect: Mood normal.        Speech: Speech normal.        Behavior: Behavior is cooperative.      UC Treatments / Results  Labs (all labs ordered are listed, but only abnormal results are displayed) Labs Reviewed - No data to display  EKG   Radiology No results found.  Procedures Procedures (including critical care time)  Medications Ordered in UC Medications - No data to display  Initial Impression / Assessment and Plan / UC Course  I have reviewed the triage vital signs and the nursing notes.  Pertinent labs & imaging results that were available during my care of the patient were reviewed by me and considered in my medical decision making (see chart for details).    Discussed with pt and wife need to be evaluated at higher level of care(ER)for traumatic fall with LOC, +nausea, severe back pain.  Patient and wife both verbalized understanding to this provider and will go POV to [facility] ER for further evaluation  Ddx: Trauma, fall, +head injury with LOC, back pain, fracture Final Clinical Impressions(s) / UC Diagnoses   Final diagnoses:  Fall, initial encounter  Injury of head, initial  encounter  Acute bilateral low back pain without sciatica  Elevated blood pressure reading     Discharge Instructions  Do not eat or drink anything, until evaluated by ER provider,  for fall 12 feet off shed with +LOC, +nausea since, back pain/pelvis pain     ED Prescriptions   None    PDMP not reviewed this encounter.   Aminta Loose, NP 08/29/24 1007

## 2024-08-29 NOTE — ED Triage Notes (Signed)
 Pt is with his wife  Pt c/o Fall on Sunday 08/25/24  Pt was on top of a shed and fell -12 feet.  Pt states that he fell and landed on his back.  Pt states that the pain is along his lower back and hips.

## 2024-08-30 ENCOUNTER — Encounter: Payer: Self-pay | Admitting: Family Medicine

## 2024-08-30 ENCOUNTER — Ambulatory Visit: Admitting: Family Medicine

## 2024-08-30 ENCOUNTER — Telehealth: Payer: Self-pay | Admitting: Physician Assistant

## 2024-08-30 ENCOUNTER — Ambulatory Visit: Payer: Self-pay

## 2024-08-30 ENCOUNTER — Other Ambulatory Visit: Payer: Self-pay | Admitting: Physician Assistant

## 2024-08-30 VITALS — BP 132/90 | HR 90 | Ht 66.0 in | Wt 167.0 lb

## 2024-08-30 DIAGNOSIS — S32000A Wedge compression fracture of unspecified lumbar vertebra, initial encounter for closed fracture: Secondary | ICD-10-CM

## 2024-08-30 MED ORDER — CALCITONIN (SALMON) 200 UNIT/ACT NA SOLN: 1.0000 | Freq: Every day | NASAL | 1 refills | Status: DC

## 2024-08-30 MED ORDER — OXYCODONE-ACETAMINOPHEN 5-325 MG PO TABS: 1.0000 | ORAL_TABLET | ORAL | 0 refills | Status: AC | PRN

## 2024-08-30 MED ORDER — OXYCODONE-ACETAMINOPHEN 2.5-325 MG PO TABS
1.0000 | ORAL_TABLET | ORAL | 0 refills | Status: DC | PRN
Start: 2024-08-30 — End: 2024-08-30

## 2024-08-30 MED ORDER — NAPROXEN 500 MG PO TABS: 500.0000 mg | ORAL_TABLET | Freq: Two times a day (BID) | ORAL | 0 refills | Status: DC

## 2024-08-30 NOTE — Telephone Encounter (Signed)
 Noted  Pt has a appt.  KP

## 2024-08-30 NOTE — Patient Instructions (Addendum)
 Lumbar Spine Fracture Care  - Attend urgent neurosurgery evaluation in [place] as scheduled. - Take Percocet for pain as prescribed. - Take naproxen  twice daily with food. - Use nasal spray 1 spray daily, alternate nostril - Rest and avoid all physical activity until seen by neurosurgery.  Constipation Management  - Increase water  intake. - Take Miralax , one scoop daily.  When to Seek Immediate Medical Attention  - New or worsening numbness, tingling, or weakness in your legs - Loss of bladder or bowel control - Severe or increasing pain not relieved by medication - Any sudden changes in ability to move your legs  Continue to follow these instructions and attend all scheduled appointments.

## 2024-08-30 NOTE — Telephone Encounter (Signed)
 FYI Only or Action Required?: FYI only for provider.  Patient was last seen in primary care on 05/07/2024 by Justus Leita DEL, MD.  Called Nurse Triage reporting Back Pain.  Symptoms began several days ago.  Interventions attempted: OTC medications: Tylenol  500 mg.  Symptoms are: unchanged.  Triage Disposition: No disposition on file.  Patient/caregiver understands and will follow disposition?: Yes     Copied from CRM [id number]. Topic: Clinical - Red Word Triage >> Aug 30, 2024 11:24 AM Tiffini S wrote: [facility] that prompted transfer to Nurse Triage: Patient fell off of a shield on 08/25/24- was seen in the ER and a brace was given the patient back/ is in pain was to the OTC Tylenol  500mg - not helping. Answer Assessment - Initial Assessment Questions 1. ONSET: When did the pain begin? (e.g., minutes, hours, days)     Aug 31 had a fall off of a shed 2. LOCATION: Where does it hurt? (upper, mid or lower back)     Lower back  3. SEVERITY: How bad is the pain?  (e.g., Scale 1-10; mild, moderate, or severe)     Can't even sleep, 10/10 4. PATTERN: Is the pain constant? (e.g., yes, no; constant, intermittent)      constant 5. RADIATION: Does the pain shoot into your legs or somewhere else?     denies 6. CAUSE:  What do you think is causing the back pain?      fall 7. BACK OVERUSE:  Any recent lifting of heavy objects, strenuous work or exercise?     denies 8. MEDICINES: What have you taken so far for the pain? (e.g., nothing, acetaminophen , NSAIDS)     Tylenol  500 mg and no relief 9. NEUROLOGIC SYMPTOMS: Do you have any weakness, numbness, or problems with bowel/bladder control?     Seen in ER and released today, denies above 10. OTHER SYMPTOMS: Do you have any other symptoms? (e.g., fever, abdomen pain, burning with urination, blood in urine)       denies 11. PREGNANCY: Is there any chance you are pregnant? When was your last menstrual period?        na  Protocols used: Back Pain-A-AH

## 2024-08-30 NOTE — Progress Notes (Addendum)
 Primary Care / Sports Medicine Office Visit  Patient Information:  Patient ID: Avrey Hyser, male DOB: August 14, 1961 Age: 63 y.o. MRN: [id number]   Hady Niemczyk Scarfo is a pleasant 63 y.o. male presenting with the following:  Chief Complaint  Patient presents with   Back Pain    Patient fell off shed on Monday 08/26/24. Since the fall patient has been in extreme pain 10/10 pain level. He went to the UC who sent him to [facility]. He had xray's and MRI yesterday. Difficulty sleeping. He denies any numbness, tingling, or radiating pain. No change in bowel or bladder functions or habits. Patient is not taking any pain medication.    Vitals:   08/30/24 1501  BP: (!) 132/90  Pulse: 90  SpO2: 95%   Vitals:   08/30/24 1501  Weight: 167 lb (75.8 kg)  Height: 5' 6 (1.676 m)   Body mass index is 26.95 kg/m.  No results found.   Independent interpretation of notes and tests performed by another provider:   See below.  Procedures performed:   None  Pertinent History, Exam, Impression, and Recommendations:   Problem List Items Addressed This Visit     Closed compression fracture of lumbosacral spine (HCC) - Primary   History of Present Illness Johm Pfannenstiel is a 63 year old male who presents with back pain following a fall from a height. He is accompanied by his wife.  Lumbar spine trauma and pain - Sustained a fall from a height of approximately twelve feet onto cups and plates on dirt, landing on his back on 08/26/2024 - Immediate onset of severe back pain following the fall' - Was able to ambulate and walked to his son's house, following this went to [facility] ER on 08/29/2024 - Imaging (CT and x-rays) revealed multiple lumbar spine fractures, including at L1 and possibly extending through L4 - Significant pain persists, impacting ability to rest and sleep - Initial pain management with ibuprofen  Neurological symptoms - No numbness or weakness in the  legs - Able to move legs and walk after the incident - Intact sensation in the legs and perineal region - Able to move toes - Chronic foot issue due to previous injury, unchanged since the fall  Bowel and bladder function - No bowel movement since Sunday - Normal urination without incontinence or accidents  Physical Exam  NEUROVASCULAR (LOWER EXTREMITIES) Sensorimotor function intact bilaterally. Sensation preserved to light touch across dermatomal distributions of the legs and feet. Motor function intact with ability to dorsiflex and plantarflex ankles and extend great toes bilaterally. Reflexes symmetric. Distal pulses palpable with brisk capillary refill. No clonus or pathologic reflexes elicited.  SPINE / BACK TLSO brace in place; direct spinal and paraspinal examination deferred due to brace and known multiple vertebral fractures.  Results RADIOLOGY Lumbar spine CT:  EXAM: Computed tomography, lumbar spine without contrast material.  DATE: 08/29/2024 12:24 PM  ACCESSION: [id number] UN  DICTATED: 08/29/2024 12:25 PM  INTERPRETATION LOCATION: [facility]   CLINICAL INDICATION: 63 years old Male with fall from 10 feet, back pain    COMPARISON: None   TECHNIQUE: Axial CT images through the lumbar spine without contrast. Coronal and sagittal reformatted images, bone and soft tissue algorithm are provided.   FINDINGS:   Acute L1 vertebral fracture involving the vertebral body with approximately 50% height loss and extending into the left pedicle, transverse process, pars interarticularis, and lamina. Additional left L2-L4 transverse process fractures, mildly displaced at L3.  Trace anterolisthesis of T12 on L1. Grade 1 anterolisthesis of L3 on L4. Moderate bilateral osseous neuroforaminal narrowing at L5-S1.   Assessment and Plan Lumbar vertebral fractures (multiple levels) Multiple lumbar fractures from L1 to L4, compression fracture at L1. Reports no overt sensorimotor  deficits and exam otherwise reassuring given acuity and severity oif etiology. Risk of instability and surgical evaluation conveyed. - Refer to neurosurgery in [facility] for urgent evaluation. - Prescribe Percocet for pain - Prescribe Naproxen  twice daily with food. - Advise rest and activity avoidance until neurosurgical evaluation. - Instruct ER visit if pain worsens, or changes in bowel/bladder function or leg weakness occur.  Constipation Constipation likely due to back issues. - Increase water intake. - Start Miralax, one scoop daily.      Relevant Medications   oxycodone -acetaminophen  (PERCOCET) 2.5-325 MG tablet   calcitonin, salmon, (MIACALCIN/FORTICAL) 200 UNIT/ACT nasal spray   naproxen  (NAPROSYN ) 500 MG tablet   Other Relevant Orders   Ambulatory referral to Neurosurgery     Orders & Medications Medications:  Meds ordered this encounter  Medications   oxycodone -acetaminophen  (PERCOCET) 2.5-325 MG tablet    Sig: Take 1-3 tablets by mouth every 4 (four) hours as needed for up to 5 days for pain.    Dispense:  90 tablet    Refill:  0   calcitonin, salmon, (MIACALCIN/FORTICAL) 200 UNIT/ACT nasal spray    Sig: Place 1 spray into alternate nostrils daily.    Dispense:  3.7 mL    Refill:  1   naproxen  (NAPROSYN ) 500 MG tablet    Sig: Take 1 tablet (500 mg total) by mouth 2 (two) times daily with a meal.    Dispense:  30 tablet    Refill:  0   Orders Placed This Encounter  Procedures   Ambulatory referral to Neurosurgery     No follow-ups on file.     Selinda JINNY Ku, MD, [facility]   Primary Care Sports Medicine Primary Care and Sports Medicine at [facility]

## 2024-08-30 NOTE — Telephone Encounter (Signed)
 Call taken as provider on-call. Received call around 9 pm from Jen with [facility]. Pharmacy does not have the oxycodone  sent in after office visit today; apparently Percocet 2.5-325 mg does not exist anymore. 5 mg and 7.5 mg were both options. Rx modified and resent. Patient's spouse Ruby notified. Unfortunately they will not be able to make it to the pharmacy before they close, but will pick it up in the morning.

## 2024-08-30 NOTE — Assessment & Plan Note (Addendum)
 History of Present Illness [name] is a 63 year old male who presents with back pain following a fall from a height. He is accompanied by his wife.  Lumbar spine trauma and pain - Sustained a fall from a height of approximately twelve feet onto cups and plates on dirt, landing on his back on 08/26/2024 - Immediate onset of severe back pain following the fall' - Was able to ambulate and walked to his son's house, following this went to [facility] ER on 08/29/2024 - Imaging (CT and x-rays) revealed multiple lumbar spine fractures, including at L1 and possibly extending through L4 - Significant pain persists, impacting ability to rest and sleep - Initial pain management with ibuprofen  Neurological symptoms - No numbness or weakness in the legs - Able to move legs and walk after the incident - Intact sensation in the legs and perineal region - Able to move toes - Chronic foot issue due to previous injury, unchanged since the fall  Bowel and bladder function - No bowel movement since Sunday - Normal urination without incontinence or accidents  Physical Exam  NEUROVASCULAR (LOWER EXTREMITIES) Sensorimotor function intact bilaterally. Sensation preserved to light touch across dermatomal distributions of the legs and feet. Motor function intact with ability to dorsiflex and plantarflex ankles and extend great toes bilaterally. Reflexes symmetric. Distal pulses palpable with brisk capillary refill. No clonus or pathologic reflexes elicited.  SPINE / BACK TLSO brace in place; direct spinal and paraspinal examination deferred due to brace and known multiple vertebral fractures.  Results RADIOLOGY Lumbar spine CT:  EXAM: Computed tomography, lumbar spine without contrast material.  DATE: 08/29/2024 12:24 PM  ACCESSION: [id number] UN  DICTATED: 08/29/2024 12:25 PM  INTERPRETATION LOCATION: [facility]   CLINICAL INDICATION: 63 years old Male with fall from 10 feet, back pain     COMPARISON: None   TECHNIQUE: Axial CT images through the lumbar spine without contrast. Coronal and sagittal reformatted images, bone and soft tissue algorithm are provided.   FINDINGS:   Acute L1 vertebral fracture involving the vertebral body with approximately 50% height loss and extending into the left pedicle, transverse process, pars interarticularis, and lamina. Additional left L2-L4 transverse process fractures, mildly displaced at L3. Trace anterolisthesis of T12 on L1. Grade 1 anterolisthesis of L3 on L4. Moderate bilateral osseous neuroforaminal narrowing at L5-S1.   Assessment and Plan Lumbar vertebral fractures (multiple levels) Multiple lumbar fractures from L1 to L4, compression fracture at L1. Reports no overt sensorimotor deficits and exam otherwise reassuring given acuity and severity oif etiology. Risk of instability and surgical evaluation conveyed. - Refer to neurosurgery in [facility] for urgent evaluation. - Prescribe Percocet for pain - Prescribe Naproxen  twice daily with food. - Advise rest and activity avoidance until neurosurgical evaluation. - Instruct ER visit if pain worsens, or changes in bowel/bladder function or leg weakness occur.  Constipation Constipation likely due to back issues. - Increase water intake. - Start Miralax, one scoop daily.

## 2024-09-02 ENCOUNTER — Inpatient Hospital Stay
Admission: RE | Admit: 2024-09-02 | Discharge: 2024-09-02 | Disposition: A | Discharge status: Home / Self Care | Payer: Self-pay | Source: Ambulatory Visit | Attending: Neurosurgery | Admitting: Neurosurgery

## 2024-09-02 ENCOUNTER — Other Ambulatory Visit: Payer: Self-pay

## 2024-09-02 ENCOUNTER — Inpatient Hospital Stay: Admitting: Internal Medicine

## 2024-09-02 DIAGNOSIS — Z049 Encounter for examination and observation for unspecified reason: Secondary | ICD-10-CM

## 2024-09-02 NOTE — Progress Notes (Unsigned)
 Referring Physician:  Alvia Selinda PARAS, MD [address] [place],  [place] [address]  Primary Physician:  Justus Leita DEL, MD  History of Present Illness: 09/03/2024 [name] is here today with a chief complaint of severe back pain.  He had a 12 foot fall off of a shed on August 31.  He has been in severe pain since that time.  He presented to the [facility] emergency department on September 4 where he had a CT performed.  This showed a significant L1 fracture.  He was placed in a brace and discharged.  He denies any numbness or tingling.  He continues to have severe back pain and is very limited in his movements.  Bowel/Bladder Dysfunction: none  Conservative measures:  Physical therapy:  has not participated in Multimodal medical therapy including regular antiinflammatories:  Oxycodone  Injections:  no epidural steroid injections  Past Surgery: no spine surgery  Weslie Pretlow has no symptoms of cervical myelopathy.  The symptoms are causing a significant impact on the patient's life.   I have utilized the care everywhere function in epic to review the outside records available from external health systems.   Review of Systems:  A 10 point review of systems is negative, except for the pertinent positives and negatives detailed in the HPI.  Past Medical History: Past Medical History:  Diagnosis Date   Hypertension    Tobacco use disorder 10/09/2017    Past Surgical History: Past Surgical History:  Procedure Laterality Date   lung collapse  2013    Allergies: Allergies as of 09/03/2024   (No Known Allergies)    Medications:  Current Outpatient Medications:    atorvastatin  (LIPITOR) 20 MG tablet, TAKE 1 TABLET BY MOUTH DAILY, Disp: 90 tablet, Rfl: 1   calcitonin, salmon, (MIACALCIN/FORTICAL) 200 UNIT/ACT nasal spray, Place 1 spray into alternate nostrils daily., Disp: 3.7 mL, Rfl: 1   levothyroxine  (SYNTHROID ) 75 MCG tablet, Take 1  tablet (75 mcg total) by mouth daily before breakfast., Disp: 90 tablet, Rfl: 0   methocarbamol  (ROBAXIN ) 500 MG tablet, Take 1 tablet (500 mg total) by mouth every 6 (six) hours as needed for muscle spasms., Disp: 120 tablet, Rfl: 0   naproxen  (NAPROSYN ) 500 MG tablet, Take 1 tablet (500 mg total) by mouth 2 (two) times daily with a meal., Disp: 30 tablet, Rfl: 0   olmesartan  (BENICAR ) 20 MG tablet, TAKE 1 TABLET(20 MG) BY MOUTH DAILY, Disp: 90 tablet, Rfl: 0   oxyCODONE -acetaminophen  (PERCOCET/ROXICET) 5-325 MG tablet, Take 1-2 tablets by mouth every 4 (four) hours as needed for up to 5 days for severe pain (pain score 7-10)., Disp: 60 tablet, Rfl: 0  Social History: Social History   Tobacco Use   Smoking status: Former    Current packs/day: 0.00    Average packs/day: 0.3 packs/day for 40.0 years (10.0 ttl pk-yrs)    Types: Cigarettes    Start date: 11/25/1977    Quit date: 11/25/2017    Years since quitting: 6.7   Smokeless tobacco: Never  Vaping Use   Vaping status: Never Used  Substance Use Topics   Alcohol use: No   Drug use: No    Family Medical History: Family History  Problem Relation Age of Onset   Prostate cancer Father 10   Heart attack Sister    Diabetes Brother     Physical Examination: Vitals:   09/03/24 1412  BP: 124/74    General: Patient is in pain with all  movements.  Attention to examination is appropriate.  Neck:   Supple.  Full range of motion.  Respiratory: Patient is breathing without any difficulty.   NEUROLOGICAL:     Awake, alert, oriented to person, place, and time.  Speech is clear and fluent.   Cranial Nerves: Pupils equal round and reactive to light.  Facial tone is symmetric.  Facial sensation is symmetric. Shoulder shrug is symmetric. Tongue protrusion is midline.  There is no pronator drift.  Strength: Side Biceps Triceps Deltoid Interossei Grip Wrist Ext. Wrist Flex.  R 5 5 5 5 5 5 5   L 5 5 5 5 5 5 5    Side Iliopsoas Quads  Hamstring PF DF EHL  R 5 5 5 5 5 5   L 5 5 5 5 5 5    Reflexes are 1+ and symmetric at the biceps, triceps, brachioradialis, patella and achilles.   Hoffman's is absent.   Bilateral upper and lower extremity sensation is intact to light touch.    No evidence of dysmetria noted.  Gait is abnormal-he requires his walker  Medical Decision Making  Imaging: CT L spine 08/29/2024 Acute L1 vertebral fracture involving the vertebral body and left posterior elements, likely unstable. Consider MRI for further evaluation.   Additional left L2-L4 transverse process fractures, moderately displaced at L3.   ++++++++++++++++++++   The findings of this study were discussed via telephone with DR. KAREN DENISE SERRANO by Dr. Prentice Shams on 08/29/2024 12:35 PM.   I have personally reviewed the images and agree with the above interpretation.  Assessment and Plan: [name] is a pleasant 63 y.o. male with an L1 vertebral body fracture.  He has a 3 column injury.  Based on the CT appearance of his posterior elements and T12/L1 facet joints, I am concerned that he may have an AO spine class B 2 fracture.  The alternative would be an A3 classification.  I will get an urgent MRI scan.  If there is any injury to his facet joints, ligamentum flavum, or clear fracture through his pedicles and posterior elements, this would constitute an unstable fracture and require surgical fixation.  I have offered evaluation through the emergency department which the patient and his wife declined for now.  Will get his MRI as soon as possible.  I spent a total of 30 minutes in this patient's care today. This time was spent reviewing pertinent records including imaging studies, obtaining and confirming history, performing a directed evaluation, formulating and discussing my recommendations, and documenting the visit within the medical record.    Thank you for involving me in the care of this patient.      Amairany Schumpert K.  Clois MD, [facility] Neurosurgery

## 2024-09-02 NOTE — H&P (View-Only) (Signed)
 Referring Physician:  Alvia Selinda PARAS, MD [address] [place],  [place] [address]  Primary Physician:  Justus Leita DEL, MD  History of Present Illness: 09/03/2024 Mr. [name] is here today with a chief complaint of severe back pain.  He had a 12 foot fall off of a shed on August 31.  He has been in severe pain since that time.  He presented to the [facility] emergency department on September 4 where he had a CT performed.  This showed a significant L1 fracture.  He was placed in a brace and discharged.  He denies any numbness or tingling.  He continues to have severe back pain and is very limited in his movements.  Bowel/Bladder Dysfunction: none  Conservative measures:  Physical therapy:  has not participated in Multimodal medical therapy including regular antiinflammatories:  Oxycodone  Injections:  no epidural steroid injections  Past Surgery: no spine surgery  [name] has no symptoms of cervical myelopathy.  The symptoms are causing a significant impact on the patient's life.   I have utilized the care everywhere function in epic to review the outside records available from external health systems.   Review of Systems:  A 10 point review of systems is negative, except for the pertinent positives and negatives detailed in the HPI.  Past Medical History: Past Medical History:  Diagnosis Date   Hypertension    Tobacco use disorder 10/09/2017    Past Surgical History: Past Surgical History:  Procedure Laterality Date   lung collapse  2013    Allergies: Allergies as of 09/03/2024   (No Known Allergies)    Medications:  Current Outpatient Medications:    atorvastatin  (LIPITOR) 20 MG tablet, TAKE 1 TABLET BY MOUTH DAILY, Disp: 90 tablet, Rfl: 1   calcitonin, salmon, (MIACALCIN /FORTICAL) 200 UNIT/ACT nasal spray, Place 1 spray into alternate nostrils daily., Disp: 3.7 mL, Rfl: 1   levothyroxine  (SYNTHROID ) 75 MCG tablet, Take 1  tablet (75 mcg total) by mouth daily before breakfast., Disp: 90 tablet, Rfl: 0   methocarbamol  (ROBAXIN ) 500 MG tablet, Take 1 tablet (500 mg total) by mouth every 6 (six) hours as needed for muscle spasms., Disp: 120 tablet, Rfl: 0   naproxen  (NAPROSYN ) 500 MG tablet, Take 1 tablet (500 mg total) by mouth 2 (two) times daily with a meal., Disp: 30 tablet, Rfl: 0   olmesartan  (BENICAR ) 20 MG tablet, TAKE 1 TABLET(20 MG) BY MOUTH DAILY, Disp: 90 tablet, Rfl: 0   oxyCODONE -acetaminophen  (PERCOCET/ROXICET) 5-325 MG tablet, Take 1-2 tablets by mouth every 4 (four) hours as needed for up to 5 days for severe pain (pain score 7-10)., Disp: 60 tablet, Rfl: 0  Social History: Social History   Tobacco Use   Smoking status: Former    Current packs/day: 0.00    Average packs/day: 0.3 packs/day for 40.0 years (10.0 ttl pk-yrs)    Types: Cigarettes    Start date: 11/25/1977    Quit date: 11/25/2017    Years since quitting: 6.7   Smokeless tobacco: Never  Vaping Use   Vaping status: Never Used  Substance Use Topics   Alcohol use: No   Drug use: No    Family Medical History: Family History  Problem Relation Age of Onset   Prostate cancer Father 60   Heart attack Sister    Diabetes Brother     Physical Examination: Vitals:   09/03/24 1412  BP: 124/74    General: Patient is in pain with all  movements.  Attention to examination is appropriate.  Neck:   Supple.  Full range of motion.  Respiratory: Patient is breathing without any difficulty.   NEUROLOGICAL:     Awake, alert, oriented to person, place, and time.  Speech is clear and fluent.   Cranial Nerves: Pupils equal round and reactive to light.  Facial tone is symmetric.  Facial sensation is symmetric. Shoulder shrug is symmetric. Tongue protrusion is midline.  There is no pronator drift.  Strength: Side Biceps Triceps Deltoid Interossei Grip Wrist Ext. Wrist Flex.  R 5 5 5 5 5 5 5   L 5 5 5 5 5 5 5    Side Iliopsoas Quads  Hamstring PF DF EHL  R 5 5 5 5 5 5   L 5 5 5 5 5 5    Reflexes are 1+ and symmetric at the biceps, triceps, brachioradialis, patella and achilles.   Hoffman's is absent.   Bilateral upper and lower extremity sensation is intact to light touch.    No evidence of dysmetria noted.  Gait is abnormal-he requires his walker  Medical Decision Making  Imaging: CT L spine 08/29/2024 Acute L1 vertebral fracture involving the vertebral body and left posterior elements, likely unstable. Consider MRI for further evaluation.   Additional left L2-L4 transverse process fractures, moderately displaced at L3.   ++++++++++++++++++++   The findings of this study were discussed via telephone with DR. KAREN DENISE SERRANO by Dr. Prentice Shams on 08/29/2024 12:35 PM.   I have personally reviewed the images and agree with the above interpretation.  Assessment and Plan: Mr. [name] is a pleasant 63 y.o. male with an L1 vertebral body fracture.  He has a 3 column injury.  Based on the CT appearance of his posterior elements and T12/L1 facet joints, I am concerned that he may have an AO spine class B 2 fracture.  The alternative would be an A3 classification.  I will get an urgent MRI scan.  If there is any injury to his facet joints, ligamentum flavum, or clear fracture through his pedicles and posterior elements, this would constitute an unstable fracture and require surgical fixation.  I have offered evaluation through the emergency department which the patient and his wife declined for now.  Will get his MRI as soon as possible.  I spent a total of 30 minutes in this patient's care today. This time was spent reviewing pertinent records including imaging studies, obtaining and confirming history, performing a directed evaluation, formulating and discussing my recommendations, and documenting the visit within the medical record.    Thank you for involving me in the care of this patient.      Sakina Briones K.  Clois MD, [facility] Neurosurgery

## 2024-09-03 ENCOUNTER — Other Ambulatory Visit: Payer: Self-pay | Admitting: Family Medicine

## 2024-09-03 ENCOUNTER — Ambulatory Visit (INDEPENDENT_AMBULATORY_CARE_PROVIDER_SITE_OTHER): Admitting: Neurosurgery

## 2024-09-03 ENCOUNTER — Telehealth: Payer: Self-pay

## 2024-09-03 VITALS — BP 124/74 | Ht 66.0 in | Wt 169.4 lb

## 2024-09-03 DIAGNOSIS — S32010A Wedge compression fracture of first lumbar vertebra, initial encounter for closed fracture: Secondary | ICD-10-CM | POA: Diagnosis not present

## 2024-09-03 DIAGNOSIS — S32009A Unspecified fracture of unspecified lumbar vertebra, initial encounter for closed fracture: Secondary | ICD-10-CM

## 2024-09-03 DIAGNOSIS — W139XXA Fall from, out of or through building, not otherwise specified, initial encounter: Secondary | ICD-10-CM | POA: Diagnosis not present

## 2024-09-03 MED ORDER — METHOCARBAMOL 500 MG PO TABS: 500.0000 mg | ORAL_TABLET | Freq: Four times a day (QID) | ORAL | 0 refills | Status: DC | PRN

## 2024-09-03 NOTE — Transitions of Care (Post Inpatient/ED Visit) (Signed)
   09/03/2024  Name: [name] MRN: [id number] DOB: [birth year]  Today's TOC FU Call Status: Today's TOC FU Call Status:: Successful TOC FU Call Completed TOC FU Call Complete Date: 09/04/24 Patient's Name and Date of Birth confirmed.  Transition Care Management Follow-up Telephone Call Date of Discharge: 08/30/24 Discharge Facility: Other ([facility]) Name of Other ([facility]) Discharge Facility: [facility] Type of Discharge: Emergency Department Reason for ED Visit: Other: (Fall, Neck Fracture) How have you been since you were released from the hospital?: Same  Items Reviewed: Did you receive and understand the discharge instructions provided?: Yes Medications obtained,verified, and reconciled?: Yes (Medications Reviewed) Any new allergies since your discharge?: No Dietary orders reviewed?: NA Do you have support at home?: Yes People in Home [RPT]: spouse Name of Support/Comfort Primary Source: RUBY  Medications Reviewed Today: Medications Reviewed Today     Reviewed by [name], CMA (Certified Medical Assistant) on 09/03/24 at 0827  Med List Status: <None>   Medication Order Taking? Sig Documenting Provider Last Dose Status Informant  atorvastatin  (LIPITOR) 20 MG tablet [id number] Yes TAKE 1 TABLET BY MOUTH DAILY [name], Ryan H, MD  Active   calcitonin, salmon, (MIACALCIN/FORTICAL) 200 UNIT/ACT nasal spray [id number] Yes Place 1 spray into alternate nostrils daily. [name], Ryan J, MD  Active   levothyroxine  (SYNTHROID ) 75 MCG tablet [id number] Yes Take 1 tablet (75 mcg total) by mouth daily before breakfast. Ryan Leita DEL, MD  Active   naproxen  (NAPROSYN ) 500 MG tablet [id number] Yes Take 1 tablet (500 mg total) by mouth 2 (two) times daily with a meal. Ryan Selinda PARAS, MD  Active   olmesartan  (BENICAR ) 20 MG tablet [id number] Yes TAKE 1 TABLET(20 MG) BY MOUTH DAILY [name], Ryan H, MD  Active   oxyCODONE -acetaminophen  (PERCOCET/ROXICET) 5-325 MG  tablet [id number] Yes Take 1-2 tablets by mouth every 4 (four) hours as needed for up to 5 days for severe pain (pain score 7-10). Ryan Toribio SQUIBB, PA  Active             Home Care and Equipment/Supplies: Were Home Health Services Ordered?: NA Any new equipment or medical supplies ordered?: NA  Functional Questionnaire: Do you need assistance with bathing/showering or dressing?: Yes Do you need assistance with meal preparation?: Yes Do you need assistance with eating?: No Do you have difficulty maintaining continence: No Do you need assistance with getting out of bed/getting out of a chair/moving?: Yes Do you have difficulty managing or taking your medications?: No  Follow up appointments reviewed: PCP Follow-up appointment confirmed?: NA Specialist Hospital Follow-up appointment confirmed?: Yes Date of Specialist follow-up appointment?: 09/03/24 Follow-Up Specialty Provider:: Ryan Katrina Do you need transportation to your follow-up appointment?: No Do you understand care options if your condition(s) worsen?: Yes-patient verbalized understanding    [name]  Primary Care & Sports Medicine [facility] CMA, AAMA [address]  [place] [address] Office [phone]  Fax: [phone]

## 2024-09-04 ENCOUNTER — Ambulatory Visit
Admission: RE | Admit: 2024-09-04 | Discharge: 2024-09-04 | Disposition: A | Discharge status: Home / Self Care | Source: Ambulatory Visit | Attending: Neurosurgery | Admitting: Neurosurgery

## 2024-09-04 DIAGNOSIS — S32009A Unspecified fracture of unspecified lumbar vertebra, initial encounter for closed fracture: Secondary | ICD-10-CM | POA: Diagnosis present

## 2024-09-07 ENCOUNTER — Telehealth: Payer: Self-pay | Admitting: Neurosurgery

## 2024-09-07 NOTE — Telephone Encounter (Signed)
 MRI Results 09/04/2024 IMPRESSION: 1. Superior endplate fracture of L1 with diffuse edema, consistent with a subacute nonhealed fracture. 2. Slight degenerative anterolisthesis at T12-L1 and L3-4. 3. Type 2 Modic changes at L5-S1 with 30% loss of height.   Electronically signed by: Lonni Necessary MD 09/04/2024 04:58 PM EDT RP Workstation: [id number]  ADDENDUM: Focal STIR signal is present within the infraspinatus ligament between the posterior spinous process of T12 and L1 consistent with focal edema or tear. This is consistent with a flexion injury. Edema is present within the left greater than right pedicle consistent with pedicle fracture. ----------------------------------------------------   Electronically signed by: Lonni Necessary MD 09/05/2024 10:05 AM EDT RP Workstation: [id number]  I discussed this with the patient's wife.  He has a 3 column injury.  By definition, this is unstable.  We will arrange for surgical intervention.  This is required to decrease the chances of future injury or neurologic decline.

## 2024-09-10 ENCOUNTER — Telehealth: Payer: Self-pay

## 2024-09-10 ENCOUNTER — Other Ambulatory Visit: Payer: Self-pay

## 2024-09-10 DIAGNOSIS — M532X6 Spinal instabilities, lumbar region: Secondary | ICD-10-CM

## 2024-09-10 DIAGNOSIS — S32009A Unspecified fracture of unspecified lumbar vertebra, initial encounter for closed fracture: Secondary | ICD-10-CM

## 2024-09-10 DIAGNOSIS — Z01818 Encounter for other preprocedural examination: Secondary | ICD-10-CM

## 2024-09-10 NOTE — Telephone Encounter (Deleted)
 Spoke with pts wife

## 2024-09-10 NOTE — Telephone Encounter (Deleted)
 Called main #, no answer, no voicemail option.

## 2024-09-10 NOTE — Telephone Encounter (Addendum)
 I spoke with [name]. We discussed the information below. Per her request, I have also placed a copy in the mail. She informed me that he will have Medicare Part A this month, but he is also keeping his current BCBS plan.   Planned surgery: T11-L3 posterior spinal fusion, open reduction internal fixation (ORIF) L1 fracture   Surgery date: 09/20/2024 at [facility] (Medical Mall: [address], [place], [place] [address]) - you will find out your arrival time the business day before your surgery.   Pre-op appointment at [facility] Pre-admit Testing: you will receive a call with a date/time for this appointment. If you are scheduled for an in person appointment, Pre-admit Testing is located on the first floor of the [facility] building, [address], [address]. During this appointment, they will advise you which medications you can take the morning of surgery, and which medications you will need to hold for surgery. Labs (such as blood work, EKG) may be done at your pre-op appointment. You are not required to fast for these labs. Should you need to change your pre-op appointment, please call Pre-admit testing at [phone].      Blood thinners:  Aspirin 81mg :   stop aspirin 7 days prior, resume aspirin 14 days after    Brace: bring brace you are currently wearing to the hospital the day of surgery.    NSAIDS (Non-steroidal anti-inflammatory drugs): because you are having a fusion, please avoid taking any NSAIDS (examples: ibuprofen, motrin, aleve , naproxen , meloxicam, diclofenac) for 3 months after surgery. Celebrex is an exception and is OK to take, if prescribed. Tylenol  is not an NSAID.    Common restrictions after spine surgery: No bending, lifting, or twisting ("BLT"). Avoid lifting objects heavier than 10 pounds for the first 6 weeks after surgery. Where possible, avoid household activities that involve lifting, bending, reaching,  pushing, or pulling such as laundry, vacuuming, grocery shopping, and childcare. Try to arrange for help from friends and family for these activities while you heal. Do not drive while taking prescription pain medication. Weeks 6 through 12 after surgery: avoid lifting more than 25 pounds.     X-rays after surgery: Because you are having a fusion or arthroplasty: for appointments after your 2 week follow-up: please arrive at the [facility] outpatient imaging center ([address], [place]) or [facility] one hour prior to your appointment for x-rays. This applies to every appointment after your 2 week follow-up. Failure to do so may result in your appointment being rescheduled. *We recently started construction to have x-ray in our office. This may be completed by the time you come in for your 6 week post-op appointment. Please check with us  closer to that time to see if you can have your x-rays at our office*    How to contact us :  If you have any questions/concerns before or after surgery, you can reach us  at [phone], or you can send a mychart message. We can be reached by phone or mychart 8am-4pm, Monday-Friday.  *Please note: Calls after 4pm are forwarded to a third party answering service. Mychart messages are not routinely monitored during evenings, weekends, and holidays. Please call our office to contact the answering service for urgent concerns during non-business hours.   If you have FMLA/disability paperwork, please drop it off or fax it to [phone]   Appointments/FMLA & disability paperwork: [employer] Registered Nurse/Surgery scheduler: Yuleimy Kretz, RN Certified Medical  Assistants: Don, CMA, Elenor, CMA, & Damien, CMA Physician Assistants: Lyle Decamp, PA-C, Edsel Goods, PA-C & Glade Boys, PA-C Surgeons: Penne Sharps, MD & [name], MD   [facility] PREADMIT TESTING VISIT and SURGERY  INFORMATION SHEET   Now that surgery has been scheduled you can anticipate several phone calls from [facility] services. A pharmacy technician will call you to verify your current list of medications taken at home.               The Pre-Service Center will call to verify your insurance information and to give you billing estimates and information.             The Preadmit Testing Office will be calling to schedule a visit to obtain information for the anesthesia team and provide instructions on preparation for surgery.  What can you expect for the Preadmit Testing Visit: Appointments may be scheduled in-person or by telephone.  If a telephone visit is scheduled, you may be asked to come into the office to have lab tests or other studies performed.   This visit will not be completed any greater than 14 days prior to your surgery.  If your surgery has been scheduled for a future date, please do not be alarmed if we have not contacted you to schedule an appointment more than a month prior to the surgery date.    Please be prepared to provide the following information during this appointment:            -Personal medical history                                               -Medication and allergy list            -Any history of problems with anesthesia              -Recent lab work or diagnostic studies            -Please notify us  of any needs we should be aware of to provide the best care possible           -You will be provided with instructions on how to prepare for your surgery.    On The Day of Surgery:  You must have a driver to take you home after surgery, you will be asked not to drive for 24 hours following surgery.  Taxi, Gisele and non-medical transport will not be acceptable means of transportation unless you have a responsible individual who will be traveling with you.  Visitors in the surgical area:   2 people will be able to visit you in your room once your preparation for surgery  has been completed. During surgery, your visitors will be asked to wait in the Surgery Waiting Area.  It is not a requirement for them to stay, if they prefer to leave and come back.  Your visitor(s) will be given an update once the surgery has been completed.  No visitors are allowed in the initial recovery room to respect patient privacy and safety.  Once you are more awake and transfer to the secondary recovery area, or are transferred to an inpatient room, visitors will again be able to see you.  To respect and protect your privacy: We will ask on the day of surgery who your driver will be and  what the contact number for that individual will be. We will ask if it is okay to share information with this individual, or if there is an alternative individual that we, or the surgeon, should contact to provide updates and information. If family or friends come to the surgical information desk requesting information about you, who you have not listed with us , no information will be given.   It may be helpful to designate someone as the main contact who will be responsible for updating your other friends and family.    PREADMIT TESTING OFFICE: [phone] SAME DAY SURGERY: [phone] We look forward to caring for you before and throughout the process of your surgery.

## 2024-09-12 ENCOUNTER — Telehealth: Payer: Self-pay | Admitting: Neurosurgery

## 2024-09-12 DIAGNOSIS — S32009A Unspecified fracture of unspecified lumbar vertebra, initial encounter for closed fracture: Secondary | ICD-10-CM

## 2024-09-12 DIAGNOSIS — M532X6 Spinal instabilities, lumbar region: Secondary | ICD-10-CM

## 2024-09-12 MED ORDER — CYCLOBENZAPRINE HCL 10 MG PO TABS: 10.0000 mg | ORAL_TABLET | Freq: Three times a day (TID) | ORAL | 0 refills | Status: DC | PRN

## 2024-09-12 NOTE — Telephone Encounter (Signed)
 Patients wife is calling states that the patient has been taking methocarbamol  (ROBAXIN ) 500 MG tablet  but it is giving him very bad indigestion/kept him up all night.  Wanting to know what they should do, please advise.

## 2024-09-12 NOTE — Telephone Encounter (Signed)
 I spoke with his wife. She reports he is having burning into his esophagus that is keeping him awake at night that started since he started the methocarbamol . She didn't give it to him this morning or last night and he did not have any indigestion.  I asked if there is another muscle relaxer he has tried in the past that he could tolerate, but she could not think of one.   [facility] on [address] in [place].

## 2024-09-12 NOTE — Telephone Encounter (Signed)
 Agree with stopping methocarbamol .   Can try cyclobenzaprine  for muscle spasms. Let him know this can make him sleepy. I sent to [facility].

## 2024-09-12 NOTE — Telephone Encounter (Signed)
 Mrs Iannello advised

## 2024-09-13 ENCOUNTER — Other Ambulatory Visit: Payer: Self-pay

## 2024-09-13 ENCOUNTER — Encounter
Admission: RE | Admit: 2024-09-13 | Discharge: 2024-09-13 | Disposition: A | Discharge status: Home / Self Care | Source: Ambulatory Visit | Attending: Neurosurgery | Admitting: Neurosurgery

## 2024-09-13 VITALS — BP 135/82 | HR 72 | Ht 66.0 in | Wt 165.3 lb

## 2024-09-13 DIAGNOSIS — Z01812 Encounter for preprocedural laboratory examination: Secondary | ICD-10-CM

## 2024-09-13 DIAGNOSIS — S32000A Wedge compression fracture of unspecified lumbar vertebra, initial encounter for closed fracture: Secondary | ICD-10-CM

## 2024-09-13 DIAGNOSIS — Z01810 Encounter for preprocedural cardiovascular examination: Secondary | ICD-10-CM

## 2024-09-13 DIAGNOSIS — Z01818 Encounter for other preprocedural examination: Secondary | ICD-10-CM | POA: Insufficient documentation

## 2024-09-13 DIAGNOSIS — I10 Essential (primary) hypertension: Secondary | ICD-10-CM | POA: Diagnosis not present

## 2024-09-13 HISTORY — DX: Gastro-esophageal reflux disease without esophagitis: K21.9

## 2024-09-13 HISTORY — DX: Unspecified fracture of unspecified lumbar vertebra, initial encounter for closed fracture: S32.009A

## 2024-09-13 HISTORY — DX: Malignant neoplasm of prostate: C61

## 2024-09-13 HISTORY — DX: Nicotine dependence, unspecified, in remission: F17.201

## 2024-09-13 HISTORY — DX: Wedge compression fracture of unspecified lumbar vertebra, initial encounter for closed fracture: S32.000A

## 2024-09-13 HISTORY — DX: Hypothyroidism, unspecified: E03.9

## 2024-09-13 HISTORY — DX: Prediabetes: R73.03

## 2024-09-13 HISTORY — DX: Hyperlipidemia, unspecified: E78.5

## 2024-09-13 LAB — BASIC METABOLIC PANEL WITH GFR
Anion gap: 14 (ref 5–15)
BUN: 25 mg/dL — ABNORMAL HIGH (ref 8–23)
CO2: 23 mmol/L (ref 22–32)
Calcium: 9.5 mg/dL (ref 8.9–10.3)
Chloride: 100 mmol/L (ref 98–111)
Creatinine, Ser: 1.19 mg/dL (ref 0.61–1.24)
GFR, Estimated: >60 mL/min (ref >60)
Glucose, Bld: 94 mg/dL (ref 70–99)
Potassium: 4.4 mmol/L (ref 3.5–5.1)
Sodium: 137 mmol/L (ref 135–145)

## 2024-09-13 LAB — TYPE AND SCREEN
ABO/RH(D): A POS
Antibody Screen: NEGATIVE

## 2024-09-13 LAB — CBC
HCT: 50 % (ref 39.0–52.0)
Hemoglobin: 16.3 g/dL (ref 13.0–17.0)
MCH: 28.8 pg (ref 26.0–34.0)
MCHC: 32.6 g/dL (ref 30.0–36.0)
MCV: 88.3 fL (ref 80.0–100.0)
Platelets: 367 K/uL (ref 150–400)
RBC: 5.66 MIL/uL (ref 4.22–5.81)
RDW: 13.3 % (ref 11.5–15.5)
WBC: 8.9 K/uL (ref 4.0–10.5)
nRBC: 0 % (ref 0.0–0.2)

## 2024-09-13 LAB — SURGICAL PCR SCREEN
MRSA, PCR: NEGATIVE
Staphylococcus aureus: NEGATIVE

## 2024-09-13 NOTE — Patient Instructions (Signed)
 Pre-operative 5 CHG Bath Instructions   You can play a key role in reducing the risk of infection after surgery. Your skin needs to be as free of germs as possible. You can reduce the number of germs on your skin by washing with CHG (chlorhexidine gluconate) soap before surgery. CHG is an antiseptic soap that kills germs and continues to kill germs even after washing.   DO NOT use if you have an allergy to chlorhexidine/CHG or antibacterial soaps. If your skin becomes reddened or irritated, stop using the CHG and notify one of our RNs at [phone].   Please shower with the CHG soap starting 4 days before surgery using the following schedule:     Please keep in mind the following:  DO NOT shave, including legs and underarms, starting the day of your first shower.   You may shave your face at any point before/day of surgery.  Place clean sheets on your bed the day you start using CHG soap. Use a clean washcloth (not used since being washed) for each shower. DO NOT sleep with pets once you start using the CHG.   CHG Shower Instructions:  If you choose to wash your hair and private area, wash first with your normal shampoo/soap.  After you use shampoo/soap, rinse your hair and body thoroughly to remove shampoo/soap residue.  Turn the water OFF and apply about 3 tablespoons (45 ml) of CHG soap to a CLEAN washcloth.  Apply CHG soap ONLY FROM YOUR NECK DOWN TO YOUR TOES (washing for 3-5 minutes)  DO NOT use CHG soap on face, private areas, open wounds, or sores.  Pay special attention to the area where your surgery is being performed.  If you are having back surgery, having someone wash your back for you may be helpful. Wait 2 minutes after CHG soap is applied, then you may rinse off the CHG soap.  Pat dry with a clean towel  Put on clean clothes/pajamas   If you choose to wear lotion, please use ONLY the CHG-compatible lotions on the back of this paper.     Additional instructions for  the day of surgery: DO NOT APPLY any lotions, deodorants, cologne, or perfumes.   Put on clean/comfortable clothes.  Brush your teeth.  Ask your nurse before applying any prescription medications to the skin.      CHG Compatible Lotions   Aveeno Moisturizing lotion  Cetaphil Moisturizing Cream  Cetaphil Moisturizing Lotion  Clairol Herbal Essence Moisturizing Lotion, Dry Skin  Clairol Herbal Essence Moisturizing Lotion, Extra Dry Skin  Clairol Herbal Essence Moisturizing Lotion, Normal Skin  Curel Age Defying Therapeutic Moisturizing Lotion with Alpha Hydroxy  Curel Extreme Care Body Lotion  Curel Soothing Hands Moisturizing Hand Lotion  Curel Therapeutic Moisturizing Cream, Fragrance-Free  Curel Therapeutic Moisturizing Lotion, Fragrance-Free  Curel Therapeutic Moisturizing Lotion, Original Formula  Eucerin Daily Replenishing Lotion  Eucerin Dry Skin Therapy Plus Alpha Hydroxy Crme  Eucerin Dry Skin Therapy Plus Alpha Hydroxy Lotion  Eucerin Original Crme  Eucerin Original Lotion  Eucerin Plus Crme Eucerin Plus Lotion  Eucerin TriLipid Replenishing Lotion  Keri Anti-Bacterial Hand Lotion  Keri Deep Conditioning Original Lotion Dry Skin Formula Softly Scented  Keri Deep Conditioning Original Lotion, Fragrance Free Sensitive Skin Formula  Keri Lotion Fast Absorbing Fragrance Free Sensitive Skin Formula  Keri Lotion Fast Absorbing Softly Scented Dry Skin Formula  Keri Original Lotion  Keri Skin Renewal Lotion Keri Silky Smooth Lotion  Keri Silky Smooth Sensitive Skin Lotion  Nivea Body Creamy Conditioning Oil  Nivea Body Extra Enriched Teacher, adult education Moisturizing Lotion Nivea Crme  Nivea Skin Firming Lotion  NutraDerm 30 Skin Lotion  NutraDerm Skin Lotion  NutraDerm Therapeutic Skin Cream  NutraDerm Therapeutic Skin Lotion  ProShield Protective Hand Cream  Provon moisturizing lotion    Instrucciones preoperatorias para el  bao con CHG  Usted puede desempear un papel clave en la reduccin del riesgo de infeccin despus de la ciruga. Su piel debe estar lo ms libre de grmenes posible. Puede reducir la cantidad de grmenes en su piel lavndose con jabn CHG (gluconato de clorhexidina) antes de la ciruga. El CHG es un jabn antisptico que elimina los grmenes y contina hacindolo incluso despus del lavado.  NO lo use si tiene alergia a la clorhexidina/CHG o a los Theatre manager. Si su piel se enrojece o se irrita, deje de usar el CHG y notifique a ignacia de nuestras enfermeras al [phone].  Dchese con el jabn CHG a partir de 4 das antes de la ciruga siguiendo el siguiente horario:  Financial planner en cuenta lo siguiente:  NO se afeite, ni siquiera las piernas ni las [place], a Glass blower/designer del da de su primera ducha. Puede afeitarse la cara en cualquier momento antes o el mismo da de la [place].  Coloque sbanas limpias en su cama el da que empiece a usar el jabn CHG.  Use una toallita limpia (sin usar desde que la lav) para cada ducha.  NO duerma con mascotas una vez que empiece a usar el CHG.  Instrucciones para la ducha con CHG: 1. Si decide lavarse el cabello y las partes ntimas, lvese primero con su champ o jabn habitual. 2. Despus de usar el champ o jabn, enjuague bien el cabello y el cuerpo para eliminar los residuos. 3. Cierre el agua y aplique aproximadamente 3 cucharadas (45 ml) de jabn CHG en una toallita LIMPIA. 4. Aplique el jabn CHG SOLO DESDE EL CUELLO HASTA LOS PIES (lavando durante 3 a 5 minutos). a. NO use jabn CHG en la cara, las partes ntimas, heridas abiertas ni llagas. b. Preste especial atencin a la zona donde se realizar la [place]. c. Si se va a someter a [place] de espalda, puede ser til que alguien le lave la espalda. 5. Espere 2 minutos despus de aplicar el jabn CHG y luego puede enjuagarse. 6. Seque con una toalla limpia. 7. Pngase ropa/pijama  limpia. 8. Si decide usar locin, use SOLO las lociones compatibles con CHG que se indican al dorso de este folleto.  Instrucciones adicionales para Medical laboratory scientific officer de la ciruga: 1. NO APLIQUE lociones, desodorantes, colonia ni perfumes. 2. Pngase ropa limpia y cmoda. 3. Cepllese los dientes. 4. Consulte a su enfermera antes de aplicar cualquier medicamento recetado en la piel.  Lociones compatibles con CHG   Locin hidratante Aveeno  Crema hidratante Cetaphil  Locin hidratante Cetaphil  Locin hidratante Clairol Herbal Essence para piel seca  Locin hidratante Clairol Herbal Essence para piel extraseca  Locin hidratante Clairol Herbal Essence para piel normal  Locin hidratante teraputica antiedad Curel con alfa hidroxi  Locin corporal Curel Extreme Care  Locin hidratante de manos calmante Curel  Crema hidratante teraputica Curel sin perfume  Locin hidratante teraputica Curel sin perfume  Locin hidratante teraputica Curel con frmula original  Locin reparadora diaria Eucerin  Crema Eucerin Dry Skin Therapy Plus Alpha Hydroxy  Eucerin Dry Skin Therapy Plus Alpha Hydroxy Locin  Eucerin Crema  Original  Eucerin Locin Original  Eucerin Crema Plus Locin Eucerin Plus  Eucerin Locin Regeneradora TriLipid  Keri Locin Antibacteriana para [place]  Keri Locin Acondicionadora Profunda Original, Frmula para Piel Seca con Suave Aroma  Keri Locin Acondicionadora Profunda Original, [place] para Piel Sensible sin Perfume  Keri Locin de Rpida Absorcin, [place] para [place] Sensible sin Perfume  Keri Locin de Rpida Absorcin, Frmula para Piel Seca con Suave Aroma  Keri Locin Original  Keri Locin Renovadora de Piel, Keri Locin Suave y [place]  Keri Locin Suave y [place] para Piel Sensible  Nivea Aceite Acondicionador Cremoso Corporal  Nivea Locin Extra Enriquecida Corporal  Nivea Locin Original Corporal  Nivea Locin Hidratante Transpirable Corporal, Nivea Crema  Nivea Locin  Reafirmante de Piel  NutraDerm 30 Skin Locin  Locin para la piel NutraDerm  Crema teraputica para la piel NutraDerm  Locin teraputica para la piel NutraDerm  Crema protectora de manos ProShield  Locin hidratante Provon

## 2024-09-13 NOTE — Patient Instructions (Addendum)
 Your procedure is scheduled on:09-20-24 Friday Report to the Registration Desk on the 1st floor of the [facility].Then proceed to the 2nd floor Surgery Desk To find out your arrival time, please call [phone] between 1PM - 3PM on:09-21-24 Thursday If your arrival time is 6:00 am, do not arrive before that time as the [facility] entrance doors do not open until 6:00 am.  REMEMBER: Instructions that are not followed completely may result in serious medical risk, up to and including death; or upon the discretion of your surgeon and anesthesiologist your surgery may need to be rescheduled.  Do not eat food after midnight the night before surgery.  No gum chewing or hard candies.  You may however, drink CLEAR liquids up to 2 hours before you are scheduled to arrive for your surgery. Do not drink anything within 2 hours of your scheduled arrival time.  Clear liquids include: - water  - apple juice without pulp - gatorade (not RED colors) - black coffee or tea (Do NOT add milk or creamers to the coffee or tea) Do NOT drink anything that is not on this list.  One week prior to surgery:Stop NOW (09-13-24) Stop ANY OVER THE COUNTER supplements until after surgery.  You may however, continue to take Tylenol /Naproxen  if needed for pain up until the day of surgery.  Stop 81 mg Aspirin 7 days prior to surgery-Last dose was on 09-09-24 per patient  Continue taking all of your other prescription medications up until the day of surgery.  Do NOT take any medication the day of surgery  No Alcohol for 24 hours before or after surgery.  No Smoking including e-cigarettes for 24 hours before surgery.  No chewable tobacco products for at least 6 hours before surgery.  No nicotine patches on the day of surgery.  Do not use any recreational drugs for at least a week (preferably 2 weeks) before your surgery.  Please be advised that the combination of cocaine and anesthesia may have negative  outcomes, up to and including death. If you test positive for cocaine, your surgery will be cancelled.  On the morning of surgery brush your teeth with toothpaste and water, you may rinse your mouth with mouthwash if you wish. Do not swallow any toothpaste or mouthwash.  Use CHG Soap as directed on instruction sheet.  Do not wear jewelry, make-up, hairpins, clips or nail polish.  For welded (permanent) jewelry: bracelets, anklets, waist bands, etc.  Please have this removed prior to surgery.  If it is not removed, there is a chance that hospital personnel will need to cut it off on the day of surgery.  Do not wear lotions, powders, or perfumes.   Do not shave body hair from the neck down 48 hours before surgery.  Contact lenses, hearing aids and dentures may not be worn into surgery.  Do not bring valuables to the hospital. [facility] is not responsible for any missing/lost belongings or valuables.   Notify your doctor if there is any change in your medical condition (cold, fever, infection).  Wear comfortable clothing (specific to your surgery type) to the hospital.  After surgery, you can help prevent lung complications by doing breathing exercises.  Take deep breaths and cough every 1-2 hours. Your doctor may order a device called an Incentive Spirometer to help you take deep breaths. When coughing or sneezing, hold a pillow firmly against your incision with both hands. This is called "splinting." Doing this helps protect your incision.  It also decreases belly discomfort.  If you are being admitted to the hospital overnight, leave your suitcase in the car. After surgery it may be brought to your room.  In case of increased patient census, it may be necessary for you, the patient, to continue your postoperative care in the Same Day Surgery department.  If you are being discharged the day of surgery, you will not be allowed to drive home. You will need a responsible individual to  drive you home and stay with you for 24 hours after surgery.   If you are taking public transportation, you will need to have a responsible individual with you.  Please call the Pre-admissions Testing Dept. at [phone] if you have any questions about these instructions.  Surgery Visitation Policy:  Patients having surgery or a procedure may have two visitors.  Children under the age of 73 must have an adult with them who is not the patient.  Inpatient Visitation:    Visiting hours are 7 a.m. to 8 p.m. Up to four visitors are allowed at one time in a patient room. The visitors may rotate out with other people during the day.  One visitor age 26 or older may stay with the patient overnight and must be in the room by 8 p.m.   Merchandiser, retail to address health-related social needs:  [email or web address]   Su procedimiento est programado para el viernes 26/08/2024. Presntese en el mostrador de registro, ubicado en Information systems manager del [employer] Luego, dirjase al [employer] de [place], ubicado en el [employer]. Para conocer su hora de llegada, llame al [phone] entre la 1:00 p. m. y las 3:00 p. m. margart longs 27/08/2024. Si su hora de llegada es a las 6:00 a. m., no llegue antes, ya que las puertas de [place] del [address] no abren [employer] 6:00 a. m.  RECUERDE: El incumplimiento de las instrucciones puede resultar en un riesgo mdico grave, incluso la [place]; o, a discrecin de su cirujano y Scientific laboratory technician, su ciruga podra tener que reprogramarse.  No consuma alimentos despus de la medianoche anterior a la ciruga.  No mastique chicle ni caramelos duros.  Sin embargo, puede beber lquidos claros hasta 2 horas antes de su hora de llegada programada para la [place]. No beba nada dentro de las 2 horas previas a su hora de llegada programada. Los lquidos claros incluyen: - Agua - Jugo de manzana sin pulpa - Gatorade (sin colorantes ROJOS) -  Caf o t negro (NO agregue leche ni cremas al caf o t). NO beba nada que no est en esta lista.  Una semana antes de la ciruga: Suspenda AHORA (19/08/2024). Suspenda CUALQUIER suplemento de venta libre hasta despus de la ciruga.  Sin embargo, puede Educational psychologist tomando Tylenol  si lo necesita para Marketing executive de la [place].  Suspenda la aspirina de 81 mg 7 das antes de la [place]. La ltima dosis fue el 15/08/2024 por paciente.  Contine tomando todos sus dems medicamentos recetados hasta el da de la ciruga.  NO tome ningn medicamento el da de la [place].  No consuma alcohol durante las 24 horas anteriores ni posteriores a Metallurgist.  No fume, incluidos los cigarrillos electrnicos, durante las 24 horas anteriores a la [place].  No consuma productos de tabaco masticable durante al [employer] 6 horas anteriores a la [place]. No use parches de nicotina el da de la [place].  No consuma drogas recreativas durante al menos una semana (  preferiblemente dos) antes de la [place].  Tenga en cuenta que la combinacin de cocana y anestesia puede tener consecuencias negativas, incluso la [place]. Si da positivo en la prueba de cocana, se cancelar la ciruga.  La maana de la ciruga, cepllese los dientes con pasta dental y agua; puede enjuagarse la boca con enjuague bucal si lo desea. No ingiera pasta dental ni enjuague bucal.  Use jabn CHG segn las instrucciones.  No use joyas, maquillaje, horquillas, broches ni esmalte de uas.  Para joyas soldadas (permanentes): pulseras, tobilleras, fajas, etc., quteselas antes de la ciruga. Si no se las [place], es posible que el personal del hospital tenga que cortarlas el da de la [place].  No use lociones, polvos ni perfumes. No se afeite el vello corporal del cuello para abajo 48 horas antes de la [place].  No se permiten lentes de contacto, audfonos ni dentaduras postizas durante la [place].  No traiga objetos de valor al  hospital. [facility] no se hace responsable de la prdida de pertenencias o objetos de valor.  Informe a su mdico si observa algn cambio en su estado de salud (resfriado, fiebre, infeccin).  Lleve ropa cmoda (especfica para el tipo de [place]) al hospital.  Despus de la ciruga, puede ayudar a prevenir complicaciones pulmonares realizando ejercicios de respiracin.  Respire profundamente y tosa cada 1 o 2 horas. Su mdico podra solicitarle un dispositivo llamado espirmetro incentivador para ayudarle a respirar profundamente. Al toser o Engineering geologist, sostenga firmemente una almohada contra la incisin con ambas manos. Esto se llama entablillar. Esto ayuda a Engineer, drilling incisin y tambin disminuye las molestias abdominales.  Si va a pasar la noche en el hospital, deje su maleta en el auto. Despus de la [place], es posible que se la lleven a su habitacin.  En caso de un aumento en el nmero de [place], podra ser necesario que usted, el [place], contine su atencin posoperatoria en el departamento de Ciruga Ambulatoria.  Si recibe el alta el mismo da de la [place], no se le permitir regresar a casa en coche. Necesitar una persona responsable que lo lleve a casa y lo acompae durante 24 horas despus de la [place].  Si utiliza transporte pblico, deber estar acompaado por una persona responsable.  Si tiene [employer], llame al [employer] de Preadmisin al [phone].  Poltica de Visitas a Ciruga:  Los [employer] se sometan a bosnia and [place] o un procedimiento pueden [employer] visitantes.  Los [employer] de 16 aos deben estar acompaados por un adulto que no sea [place].  Visitas a Pacientes Hospitalizados:  El horario de visitas es de 7:00 a. m. a 8:00 p. m. Se permiten hasta cuatro visitantes a la vez en la habitacin del [place]. Los visitantes pueden rotar con Garment/textile technologist. Un visitante mayor de  16 aos puede pasar la noche con el paciente y debe estar en la habitacin antes de las 8 p. m.  Directorio de recursos comunitarios para atender necesidades sociales relacionadas con la salud: https:

## 2024-09-16 ENCOUNTER — Telehealth: Payer: Self-pay

## 2024-09-16 NOTE — Telephone Encounter (Signed)
 I spoke with [name] to offer a sooner surgery date (09/18/24 instead of 09/20/24) due to a change in Dr Elliot OR schedule. They have agreed to move to this sooner date.

## 2024-09-18 ENCOUNTER — Other Ambulatory Visit: Payer: Self-pay

## 2024-09-18 ENCOUNTER — Inpatient Hospital Stay
Admission: RE | Admit: 2024-09-18 | Discharge: 2024-09-20 | DRG: 448 | Disposition: A | Discharge status: Home Health | Attending: Neurosurgery | Admitting: Neurosurgery

## 2024-09-18 ENCOUNTER — Encounter: Payer: Self-pay | Admitting: Neurosurgery

## 2024-09-18 ENCOUNTER — Inpatient Hospital Stay

## 2024-09-18 ENCOUNTER — Inpatient Hospital Stay: Payer: Self-pay | Admitting: Urgent Care

## 2024-09-18 ENCOUNTER — Encounter
Admission: RE | Disposition: A | Discharge status: Home Health | Payer: Self-pay | Source: Home / Self Care | Attending: Neurosurgery

## 2024-09-18 DIAGNOSIS — Z79890 Hormone replacement therapy (postmenopausal): Secondary | ICD-10-CM | POA: Diagnosis not present

## 2024-09-18 DIAGNOSIS — W138XXA Fall from, out of or through other building or structure, initial encounter: Secondary | ICD-10-CM

## 2024-09-18 DIAGNOSIS — Z8042 Family history of malignant neoplasm of prostate: Secondary | ICD-10-CM

## 2024-09-18 DIAGNOSIS — Z8249 Family history of ischemic heart disease and other diseases of the circulatory system: Secondary | ICD-10-CM

## 2024-09-18 DIAGNOSIS — Z981 Arthrodesis status: Principal | ICD-10-CM

## 2024-09-18 DIAGNOSIS — S32019A Unspecified fracture of first lumbar vertebra, initial encounter for closed fracture: Secondary | ICD-10-CM | POA: Diagnosis present

## 2024-09-18 DIAGNOSIS — M532X6 Spinal instabilities, lumbar region: Secondary | ICD-10-CM | POA: Diagnosis present

## 2024-09-18 DIAGNOSIS — I10 Essential (primary) hypertension: Secondary | ICD-10-CM | POA: Diagnosis present

## 2024-09-18 DIAGNOSIS — S32009A Unspecified fracture of unspecified lumbar vertebra, initial encounter for closed fracture: Secondary | ICD-10-CM | POA: Diagnosis present

## 2024-09-18 DIAGNOSIS — Z79899 Other long term (current) drug therapy: Secondary | ICD-10-CM | POA: Diagnosis not present

## 2024-09-18 DIAGNOSIS — Z87891 Personal history of nicotine dependence: Secondary | ICD-10-CM | POA: Diagnosis not present

## 2024-09-18 DIAGNOSIS — Z01818 Encounter for other preprocedural examination: Secondary | ICD-10-CM

## 2024-09-18 DIAGNOSIS — Z833 Family history of diabetes mellitus: Secondary | ICD-10-CM

## 2024-09-18 DIAGNOSIS — W1789XA Other fall from one level to another, initial encounter: Secondary | ICD-10-CM | POA: Diagnosis present

## 2024-09-18 HISTORY — PX: POSTERIOR LUMBAR FUSION 4 LEVEL: SHX6037

## 2024-09-18 HISTORY — PX: APPLICATION OF INTRAOPERATIVE CT SCAN: SHX6668

## 2024-09-18 LAB — ABO/RH: ABO/RH(D): A POS

## 2024-09-18 SURGERY — POSTERIOR LUMBAR FUSION 4 LEVEL
Anesthesia: General

## 2024-09-18 MED ORDER — FENTANYL CITRATE (PF) 100 MCG/2ML IJ SOLN
25.0000 ug | INTRAMUSCULAR | Status: DC | PRN
  Administered 2024-09-18: 50 ug via INTRAVENOUS
  Administered 2024-09-18: 25 ug via INTRAVENOUS

## 2024-09-18 MED ORDER — ONDANSETRON HCL 4 MG/2ML IJ SOLN
4.0000 mg | Freq: Four times a day (QID) | INTRAMUSCULAR | Status: DC | PRN
  Administered 2024-09-19: 4 mg via INTRAVENOUS
  Filled 2024-09-18: qty 2

## 2024-09-18 MED ORDER — BUPIVACAINE-EPINEPHRINE (PF) 0.5% -1:200000 IJ SOLN
INTRAMUSCULAR | Status: DC | PRN
  Administered 2024-09-18: 10 mL

## 2024-09-18 MED ORDER — PROPOFOL 10 MG/ML IV BOLUS
INTRAVENOUS | Status: DC | PRN
  Administered 2024-09-18: 100 mg via INTRAVENOUS
  Administered 2024-09-18: 150 mg via INTRAVENOUS

## 2024-09-18 MED ORDER — PHENYLEPHRINE HCL-NACL 20-0.9 MG/250ML-% IV SOLN
INTRAVENOUS | Status: DC | PRN
  Administered 2024-09-18: 40 ug/min via INTRAVENOUS

## 2024-09-18 MED ORDER — VANCOMYCIN HCL 1 G IV SOLR
INTRAVENOUS | Status: DC | PRN
  Administered 2024-09-18: 1000 mg via TOPICAL

## 2024-09-18 MED ORDER — MIDAZOLAM HCL 2 MG/2ML IJ SOLN
INTRAMUSCULAR | Status: DC | PRN
  Administered 2024-09-18: 2 mg via INTRAVENOUS

## 2024-09-18 MED ORDER — ACETAMINOPHEN 10 MG/ML IV SOLN
INTRAVENOUS | Status: DC | PRN
  Administered 2024-09-18: 1000 mg via INTRAVENOUS

## 2024-09-18 MED ORDER — PHENYLEPHRINE 80 MCG/ML (10ML) SYRINGE FOR IV PUSH (FOR BLOOD PRESSURE SUPPORT)
PREFILLED_SYRINGE | INTRAVENOUS | Status: DC | PRN
  Administered 2024-09-18: 80 ug via INTRAVENOUS
  Administered 2024-09-18: 160 ug via INTRAVENOUS
  Administered 2024-09-18 (×4): 80 ug via INTRAVENOUS
  Administered 2024-09-18 (×2): 160 ug via INTRAVENOUS
  Administered 2024-09-18: 80 ug via INTRAVENOUS

## 2024-09-18 MED ORDER — CEFAZOLIN SODIUM-DEXTROSE 2-4 GM/100ML-% IV SOLN
INTRAVENOUS | Status: AC
  Filled 2024-09-18: qty 100

## 2024-09-18 MED ORDER — SUCCINYLCHOLINE CHLORIDE 200 MG/10ML IV SOSY
PREFILLED_SYRINGE | INTRAVENOUS | Status: AC
  Filled 2024-09-18: qty 10

## 2024-09-18 MED ORDER — DEXMEDETOMIDINE HCL IN NACL 80 MCG/20ML IV SOLN
INTRAVENOUS | Status: DC | PRN
  Administered 2024-09-18: 20 ug via INTRAVENOUS

## 2024-09-18 MED ORDER — LACTATED RINGERS IV SOLN: INTRAVENOUS | Status: DC

## 2024-09-18 MED ORDER — DOCUSATE SODIUM 100 MG PO CAPS
100.0000 mg | ORAL_CAPSULE | Freq: Two times a day (BID) | ORAL | Status: DC
  Administered 2024-09-18 – 2024-09-20 (×4): 100 mg via ORAL
  Filled 2024-09-18 (×4): qty 1

## 2024-09-18 MED ORDER — ACETAMINOPHEN 500 MG PO TABS
1000.0000 mg | ORAL_TABLET | Freq: Four times a day (QID) | ORAL | Status: DC
  Administered 2024-09-18 – 2024-09-20 (×5): 1000 mg via ORAL
  Filled 2024-09-18 (×5): qty 2

## 2024-09-18 MED ORDER — POLYETHYLENE GLYCOL 3350 17 G PO PACK: 17.0000 g | PACK | Freq: Every day | ORAL | Status: DC | PRN

## 2024-09-18 MED ORDER — ACETAMINOPHEN 650 MG RE SUPP: 650.0000 mg | RECTAL | Status: DC | PRN

## 2024-09-18 MED ORDER — SODIUM CHLORIDE 0.9 % IV SOLN: 250.0000 mL | INTRAVENOUS | Status: AC

## 2024-09-18 MED ORDER — ROCURONIUM BROMIDE 100 MG/10ML IV SOLN
INTRAVENOUS | Status: DC | PRN
  Administered 2024-09-18: 5 mg via INTRAVENOUS
  Administered 2024-09-18: 20 mg via INTRAVENOUS
  Administered 2024-09-18: 35 mg via INTRAVENOUS

## 2024-09-18 MED ORDER — LIDOCAINE HCL (PF) 2 % IJ SOLN
INTRAMUSCULAR | Status: AC
  Filled 2024-09-18: qty 5

## 2024-09-18 MED ORDER — FENTANYL CITRATE (PF) 100 MCG/2ML IJ SOLN
INTRAMUSCULAR | Status: AC
  Filled 2024-09-18: qty 2

## 2024-09-18 MED ORDER — MENTHOL 3 MG MT LOZG: 1.0000 | LOZENGE | OROMUCOSAL | Status: DC | PRN

## 2024-09-18 MED ORDER — ATORVASTATIN CALCIUM 10 MG PO TABS
20.0000 mg | ORAL_TABLET | Freq: Every day | ORAL | Status: DC
  Administered 2024-09-18 – 2024-09-19 (×2): 20 mg via ORAL
  Filled 2024-09-18 (×2): qty 2

## 2024-09-18 MED ORDER — CALCITONIN (SALMON) 200 UNIT/ACT NA SOLN
1.0000 | Freq: Every day | NASAL | Status: DC
  Administered 2024-09-18 – 2024-09-20 (×3): 1 via NASAL
  Filled 2024-09-18: qty 3.7

## 2024-09-18 MED ORDER — ORAL CARE MOUTH RINSE: 15.0000 mL | Freq: Once | OROMUCOSAL | Status: AC

## 2024-09-18 MED ORDER — HYDROMORPHONE HCL 1 MG/ML IJ SOLN: 0.5000 mg | INTRAMUSCULAR | Status: AC | PRN

## 2024-09-18 MED ORDER — 0.9 % SODIUM CHLORIDE (POUR BTL) OPTIME
TOPICAL | Status: DC | PRN
  Administered 2024-09-18: 225 mL

## 2024-09-18 MED ORDER — LACTATED RINGERS IV SOLN: INTRAVENOUS | Status: DC | PRN

## 2024-09-18 MED ORDER — SODIUM CHLORIDE 0.9% FLUSH
3.0000 mL | Freq: Two times a day (BID) | INTRAVENOUS | Status: DC
  Administered 2024-09-18 – 2024-09-19 (×3): 3 mL via INTRAVENOUS

## 2024-09-18 MED ORDER — LEVOTHYROXINE SODIUM 25 MCG PO TABS
75.0000 ug | ORAL_TABLET | Freq: Every day | ORAL | Status: DC
  Administered 2024-09-19 – 2024-09-20 (×2): 75 ug via ORAL
  Filled 2024-09-18 (×2): qty 3

## 2024-09-18 MED ORDER — PHENYLEPHRINE HCL-NACL 20-0.9 MG/250ML-% IV SOLN
INTRAVENOUS | Status: AC
Start: 2024-09-18 — End: 2024-09-18
  Filled 2024-09-18: qty 250

## 2024-09-18 MED ORDER — SENNA 8.6 MG PO TABS
1.0000 | ORAL_TABLET | Freq: Two times a day (BID) | ORAL | Status: DC
  Administered 2024-09-18 – 2024-09-20 (×4): 8.6 mg via ORAL
  Filled 2024-09-18 (×4): qty 1

## 2024-09-18 MED ORDER — SURGIFLO WITH THROMBIN (HEMOSTATIC MATRIX KIT) OPTIME
TOPICAL | Status: DC | PRN
  Administered 2024-09-18: 1 via TOPICAL

## 2024-09-18 MED ORDER — ROCURONIUM BROMIDE 10 MG/ML (PF) SYRINGE
PREFILLED_SYRINGE | INTRAVENOUS | Status: AC
Start: 2024-09-18 — End: 2024-09-18
  Filled 2024-09-18: qty 10

## 2024-09-18 MED ORDER — DEXAMETHASONE SODIUM PHOSPHATE 10 MG/ML IJ SOLN
INTRAMUSCULAR | Status: DC | PRN
  Administered 2024-09-18: 10 mg via INTRAVENOUS

## 2024-09-18 MED ORDER — LIDOCAINE HCL (CARDIAC) PF 100 MG/5ML IV SOSY
PREFILLED_SYRINGE | INTRAVENOUS | Status: DC | PRN
  Administered 2024-09-18: 60 mg via INTRAVENOUS

## 2024-09-18 MED ORDER — CHLORHEXIDINE GLUCONATE 0.12 % MT SOLN
15.0000 mL | Freq: Once | OROMUCOSAL | Status: AC
  Administered 2024-09-18: 15 mL via OROMUCOSAL

## 2024-09-18 MED ORDER — SUCCINYLCHOLINE CHLORIDE 200 MG/10ML IV SOSY
PREFILLED_SYRINGE | INTRAVENOUS | Status: DC | PRN
  Administered 2024-09-18: 100 mg via INTRAVENOUS

## 2024-09-18 MED ORDER — ONDANSETRON HCL 4 MG/2ML IJ SOLN
INTRAMUSCULAR | Status: DC | PRN
  Administered 2024-09-18: 4 mg via INTRAVENOUS

## 2024-09-18 MED ORDER — ACETAMINOPHEN 10 MG/ML IV SOLN
INTRAVENOUS | Status: AC
  Filled 2024-09-18: qty 100

## 2024-09-18 MED ORDER — OXYCODONE HCL 5 MG PO TABS
10.0000 mg | ORAL_TABLET | ORAL | Status: DC | PRN
  Administered 2024-09-18 – 2024-09-20 (×2): 10 mg via ORAL
  Filled 2024-09-18 (×2): qty 2

## 2024-09-18 MED ORDER — DROPERIDOL 2.5 MG/ML IJ SOLN: 0.6250 mg | Freq: Once | INTRAMUSCULAR | Status: DC | PRN

## 2024-09-18 MED ORDER — SODIUM CHLORIDE 0.9% FLUSH: 3.0000 mL | INTRAVENOUS | Status: DC | PRN

## 2024-09-18 MED ORDER — IRRISEPT - 450ML BOTTLE WITH 0.05% CHG IN STERILE WATER, USP 99.95% OPTIME
TOPICAL | Status: DC | PRN
  Administered 2024-09-18: 450 mL via TOPICAL

## 2024-09-18 MED ORDER — FENTANYL CITRATE (PF) 100 MCG/2ML IJ SOLN
INTRAMUSCULAR | Status: DC | PRN
  Administered 2024-09-18: 100 ug via INTRAVENOUS
  Administered 2024-09-18: 50 ug via INTRAVENOUS

## 2024-09-18 MED ORDER — CYCLOBENZAPRINE HCL 10 MG PO TABS
10.0000 mg | ORAL_TABLET | Freq: Three times a day (TID) | ORAL | Status: DC | PRN
Start: 2024-09-18 — End: 2024-09-20
  Administered 2024-09-18 – 2024-09-20 (×2): 10 mg via ORAL
  Filled 2024-09-18 (×2): qty 1

## 2024-09-18 MED ORDER — MAGNESIUM CITRATE PO SOLN: 1.0000 | Freq: Once | ORAL | Status: DC | PRN

## 2024-09-18 MED ORDER — OXYCODONE HCL 5 MG PO TABS
ORAL_TABLET | ORAL | Status: AC
  Filled 2024-09-18: qty 1

## 2024-09-18 MED ORDER — ONDANSETRON HCL 4 MG/2ML IJ SOLN
INTRAMUSCULAR | Status: AC
Start: 2024-09-18 — End: 2024-09-18
  Filled 2024-09-18: qty 2

## 2024-09-18 MED ORDER — ENOXAPARIN SODIUM 40 MG/0.4ML IJ SOSY
40.0000 mg | PREFILLED_SYRINGE | INTRAMUSCULAR | Status: DC
  Administered 2024-09-19 – 2024-09-20 (×2): 40 mg via SUBCUTANEOUS
  Filled 2024-09-18 (×2): qty 0.4

## 2024-09-18 MED ORDER — ACETAMINOPHEN 325 MG PO TABS
650.0000 mg | ORAL_TABLET | ORAL | Status: DC | PRN
  Administered 2024-09-19: 650 mg via ORAL
  Filled 2024-09-18: qty 2

## 2024-09-18 MED ORDER — CEFAZOLIN IN SODIUM CHLORIDE 2-0.9 GM/100ML-% IV SOLN
2.0000 g | Freq: Once | INTRAVENOUS | Status: DC
  Filled 2024-09-18: qty 100

## 2024-09-18 MED ORDER — SORBITOL 70 % SOLN: 30.0000 mL | Freq: Every day | Status: DC | PRN

## 2024-09-18 MED ORDER — SUGAMMADEX SODIUM 200 MG/2ML IV SOLN
INTRAVENOUS | Status: DC | PRN
  Administered 2024-09-18: 200 mg via INTRAVENOUS

## 2024-09-18 MED ORDER — DEXAMETHASONE SODIUM PHOSPHATE 10 MG/ML IJ SOLN
INTRAMUSCULAR | Status: AC
  Filled 2024-09-18: qty 1

## 2024-09-18 MED ORDER — PHENOL 1.4 % MT LIQD: 1.0000 | OROMUCOSAL | Status: DC | PRN

## 2024-09-18 MED ORDER — SODIUM CHLORIDE (PF) 0.9 % IJ SOLN
INTRAMUSCULAR | Status: DC | PRN
  Administered 2024-09-18: 60 mL

## 2024-09-18 MED ORDER — CHLORHEXIDINE GLUCONATE 0.12 % MT SOLN
OROMUCOSAL | Status: AC
  Filled 2024-09-18: qty 15

## 2024-09-18 MED ORDER — VANCOMYCIN HCL IN DEXTROSE 1-5 GM/200ML-% IV SOLN
1000.0000 mg | Freq: Once | INTRAVENOUS | Status: AC
  Administered 2024-09-18: 1000 mg via INTRAVENOUS
  Filled 2024-09-18: qty 200

## 2024-09-18 MED ORDER — OXYCODONE HCL 5 MG PO TABS
5.0000 mg | ORAL_TABLET | ORAL | Status: DC | PRN
  Administered 2024-09-18 – 2024-09-19 (×2): 5 mg via ORAL
  Filled 2024-09-18: qty 1

## 2024-09-18 MED ORDER — CEFAZOLIN SODIUM-DEXTROSE 2-4 GM/100ML-% IV SOLN
2.0000 g | INTRAVENOUS | Status: AC
  Administered 2024-09-18: 2 g via INTRAVENOUS

## 2024-09-18 MED ORDER — KETAMINE HCL 50 MG/5ML IJ SOSY
PREFILLED_SYRINGE | INTRAMUSCULAR | Status: AC
Start: 2024-09-18 — End: 2024-09-18
  Filled 2024-09-18: qty 5

## 2024-09-18 MED ORDER — ONDANSETRON HCL 4 MG PO TABS: 4.0000 mg | ORAL_TABLET | Freq: Four times a day (QID) | ORAL | Status: DC | PRN

## 2024-09-18 MED ORDER — IRBESARTAN 150 MG PO TABS
150.0000 mg | ORAL_TABLET | Freq: Every day | ORAL | Status: DC
  Administered 2024-09-18 – 2024-09-20 (×3): 150 mg via ORAL
  Filled 2024-09-18 (×3): qty 1

## 2024-09-18 MED ORDER — KETAMINE HCL 50 MG/5ML IJ SOSY
PREFILLED_SYRINGE | INTRAMUSCULAR | Status: DC | PRN
  Administered 2024-09-18: 20 mg via INTRAVENOUS

## 2024-09-18 MED ORDER — PHENYLEPHRINE HCL (PRESSORS) 10 MG/ML IV SOLN
INTRAVENOUS | Status: AC
  Filled 2024-09-18: qty 1

## 2024-09-18 MED ORDER — MIDAZOLAM HCL 2 MG/2ML IJ SOLN
INTRAMUSCULAR | Status: AC
  Filled 2024-09-18: qty 2

## 2024-09-18 MED ORDER — GLYCOPYRROLATE 0.2 MG/ML IJ SOLN
INTRAMUSCULAR | Status: DC | PRN
  Administered 2024-09-18: .2 mg via INTRAVENOUS

## 2024-09-18 SURGICAL SUPPLY — 42 items
ALLOGRAFT BONESTRP KORE 2.5X10 (Bone Implant) IMPLANT
BASIN KIT SINGLE STR (MISCELLANEOUS) ×1 IMPLANT
BUR NEURO DRILL SOFT 3.0X3.8M (BURR) ×1 IMPLANT
DERMABOND ADVANCED .7 DNX12 (GAUZE/BANDAGES/DRESSINGS) IMPLANT
DRAPE C-ARMOR (DRAPES) IMPLANT
DRAPE LAPAROTOMY 100X77 ABD (DRAPES) ×1 IMPLANT
DRAPE SCAN PATIENT (DRAPES) ×1 IMPLANT
DRAPE TABLE BACK 80X90 (DRAPES) ×1 IMPLANT
DRSG TEGADERM 4X4.75 (GAUZE/BANDAGES/DRESSINGS) ×1 IMPLANT
ELECTRODE REM PT RTRN 9FT ADLT (ELECTROSURGICAL) ×1 IMPLANT
EVACUATOR 1/8 PVC DRAIN (DRAIN) IMPLANT
EX-PIN ORTHOLOCK NAV 4X150 (PIN) IMPLANT
FEE CVG SUPP BRAINLAB NG SPNE (MISCELLANEOUS) ×1 IMPLANT
GLOVE BIOGEL PI IND STRL 6.5 (GLOVE) ×1 IMPLANT
GLOVE SURG SYN 6.5 PF PI (GLOVE) ×2 IMPLANT
GLOVE SURG SYN 8.5 PF PI (GLOVE) ×3 IMPLANT
GOWN SRG LRG LVL 4 IMPRV REINF (GOWNS) ×1 IMPLANT
GOWN SRG XL LVL 3 NONREINFORCE (GOWNS) ×1 IMPLANT
HOLDER FOLEY CATH W/STRAP (MISCELLANEOUS) ×1 IMPLANT
KIT PREVENA INCISION MGT 13 (CANNISTER) IMPLANT
KIT SPINAL PRONEVIEW (KITS) ×1 IMPLANT
LAVAGE JET IRRISEPT WOUND (IRRIGATION / IRRIGATOR) ×1 IMPLANT
MANIFOLD NEPTUNE II (INSTRUMENTS) ×1 IMPLANT
MARKER SKIN DUAL TIP RULER LAB (MISCELLANEOUS) IMPLANT
MARKER SPHERE PSV REFLC 13MM (MARKER) ×7 IMPLANT
NDL SAFETY ECLIPSE 18X1.5 (NEEDLE) ×1 IMPLANT
NS IRRIG 500ML POUR BTL (IV SOLUTION) ×1 IMPLANT
PACK LAMINECTOMY ARMC (PACKS) ×1 IMPLANT
PAD ARMBOARD POSITIONER FOAM (MISCELLANEOUS) ×1 IMPLANT
ROD CREO 125MM SPINAL (Rod) IMPLANT
SCREW LOCK RELINE 5.5 TULIP (Screw) IMPLANT
SCREW RELINE-O 6.5X55 POLY (Screw) IMPLANT
SCREW RELINE-O POLY 6.5X50MM (Screw) IMPLANT
STAPLER SKIN PROX 35W (STAPLE) IMPLANT
SURGIFLO W/THROMBIN 8M KIT (HEMOSTASIS) ×1 IMPLANT
SUT STRATA 3-0 15 PS-2 (SUTURE) IMPLANT
SUT VIC AB 0 CT1 27XCR 8 STRN (SUTURE) ×2 IMPLANT
SUT VIC AB 2-0 CT1 18 (SUTURE) ×2 IMPLANT
SUTURE EHLN 3-0 FS-10 30 BLK (SUTURE) IMPLANT
SYR 10ML LL (SYRINGE) ×1 IMPLANT
SYR 30ML LL (SYRINGE) ×2 IMPLANT
TRAP FLUID SMOKE EVACUATOR (MISCELLANEOUS) ×1 IMPLANT

## 2024-09-18 NOTE — Discharge Instructions (Signed)
  Your surgeon has performed an operation on your lumbar spine (low back) to relieve pressure on one or more nerves. Many times, patients feel better immediately after surgery and can "overdo it." Even if you feel well, it is important that you follow these activity guidelines. If you do not let your back heal properly from the surgery, you can increase the chance of hardware complications and/or return of your symptoms. The following are instructions to help in your recovery once you have been discharged from the hospital.  Do not use NSAIDs for 3 months after surgery.  *Regarding compression stockings-  Please wear day and night until you are walking a couple hundred feet three times a day.   Activity    No bending, lifting, or twisting ("BLT"). Avoid lifting objects heavier than 10 pounds (gallon milk jug).  Where possible, avoid household activities that involve lifting, bending, pushing, or pulling such as laundry, vacuuming, grocery shopping, and childcare. Try to arrange for help from friends and family for these activities while your back heals.  Increase physical activity slowly as tolerated.  Taking short walks is encouraged, but avoid strenuous exercise. Do not jog, run, bicycle, lift weights, or participate in any other exercises unless specifically allowed by your doctor. Avoid prolonged sitting, including car rides.  Talk to your doctor before resuming sexual activity.  You should not drive until cleared by your doctor.  Until released by your doctor, you should not return to work or school.  You should rest at home and let your body heal.   You may shower three days after your surgery.  After showering, lightly dab your incision dry. Do not take a tub bath or go swimming for 3 weeks, or until approved by your doctor at your follow-up appointment.  If you smoke, we strongly recommend that you quit.  Smoking has been proven to interfere with normal healing in your back and will  dramatically reduce the success rate of your surgery. Please contact [facility] (800-QUIT-NOW) and use the resources at www.[facility].com for assistance in stopping smoking.  Surgical Incision   You will have an appointment on 09/23/24 to have dressing changed.  Diet            You may return to your usual diet. Be sure to stay hydrated.  When to Contact Us   Although your surgery and recovery will likely be uneventful, you may have some residual numbness, aches, and pains in your back and/or legs. This is normal and should improve in the next few weeks.  However, should you experience any of the following, contact us  immediately: New numbness or weakness Pain that is progressively getting worse, and is not relieved by your pain medications or rest Bleeding, redness, swelling, pain, or drainage from surgical incision Chills or flu-like symptoms Fever greater than 101.0 F (38.3 C) Problems with bowel or bladder functions Difficulty breathing or shortness of breath Warmth, tenderness, or swelling in your calf  Contact Information How to contact us :  If you have any questions/concerns before or after surgery, you can reach us  at [phone], or you can send a mychart message. We can be reached by phone or mychart 8am-4pm, Monday-Friday.  *Please note: Calls after 4pm are forwarded to a third party answering service. Mychart messages are not routinely monitored during evenings, weekends, and holidays. Please call our office to contact the answering service for urgent concerns during non-business hours.

## 2024-09-18 NOTE — Anesthesia Preprocedure Evaluation (Signed)
 Anesthesia Evaluation  Patient identified by MRN, date of birth, ID band Patient awake    Reviewed: Allergy & Precautions, H&P , NPO status , Patient's Chart, lab work & pertinent test results, reviewed documented beta blocker date and time   History of Anesthesia Complications Negative for: history of anesthetic complications  Airway Mallampati: II  TM Distance: >3 FB Neck ROM: full    Dental  (+) Dental Advidsory Given, Poor Dentition, Missing   Pulmonary neg pulmonary ROS, former smoker   Pulmonary exam normal breath sounds clear to auscultation       Cardiovascular Exercise Tolerance: Good hypertension, (-) angina (-) Past MI and (-) Cardiac Stents Normal cardiovascular exam(-) dysrhythmias (-) Valvular Problems/Murmurs Rhythm:regular Rate:Normal     Neuro/Psych negative neurological ROS  negative psych ROS   GI/Hepatic Neg liver ROS,GERD  ,,  Endo/Other  diabetes (borderline)Hypothyroidism    Renal/GU negative Renal ROS  negative genitourinary   Musculoskeletal   Abdominal   Peds  Hematology negative hematology ROS (+)   Anesthesia Other Findings Past Medical History: No date: Closed compression fracture of lumbosacral spine (HCC) 2012: Collapsed lung     Comment:  after falling out of tree No date: GERD (gastroesophageal reflux disease) No date: Hyperlipidemia No date: Hypertension No date: Hypothyroidism No date: Lumbar vertebral fracture (HCC) 07/27/2018: Positive PPD, treated No date: Pre-diabetes No date: Prostate cancer (HCC) No date: Tobacco use disorder, severe, in sustained remission   Reproductive/Obstetrics negative OB ROS                              Anesthesia Physical Anesthesia Plan  ASA: 2  Anesthesia Plan: General   Post-op Pain Management:    Induction: Intravenous  PONV Risk Score and Plan: 2 and Ondansetron , Dexamethasone , Midazolam  and Treatment  may vary due to age or medical condition  Airway Management Planned: Oral ETT  Additional Equipment:   Intra-op Plan:   Post-operative Plan: Extubation in OR  Informed Consent: I have reviewed the patients History and Physical, chart, labs and discussed the procedure including the risks, benefits and alternatives for the proposed anesthesia with the patient or authorized representative who has indicated his/her understanding and acceptance.     Dental Advisory Given  Plan Discussed with: Anesthesiologist, CRNA and Surgeon  Anesthesia Plan Comments:          Anesthesia Quick Evaluation

## 2024-09-18 NOTE — Op Note (Signed)
 Indications: Mr. 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   Preoperative Note:   Risks of surgery discussed include: infection, bleeding, stroke, coma, death, paralysis, CSF leak, nerve/spinal cord injury, numbness, tingling, weakness, complex regional pain syndrome, recurrent stenosis and/or disc herniation, vascular injury, development of instability, neck/back pain, need for further surgery, persistent symptoms, development of deformity, and the risks of anesthesia. The patient understood these risks and agreed to proceed.  Operative Note:  1.  Open reduction and internal fixation of an unstable L1 fracture 2. Posterolateral arthrodesis T11 to L2 3. Posterior segmental instrumentation T11 to L2 using Nuvasive Reline implants 4. Use of stereotaxis    The patient was brought to the Operating Room, intubated and turned into the prone position. All pressure points were checked and double checked. Flouroscopy was used to mark the incision. The patient was prepped and draped in the standard fashion. A full timeout was performed. Preoperative antibiotics were given. The incision was injected with local anesthetic.  The incision was opened with a scalpel, then the soft tissues divided with the Bovie. Self-retaining retractors were placed. The paraspinus muscles were reflected laterally in subperiosteal fashion until the transverse processes were visible.  Flouroscopy was used to confirm our localization.  The stereotactic array was placed.  Stereotactic images were acquired and registered to the patient.  We then used stereotactically guided drill guides to cannulate the pedicles bilaterally from T11 to L2.   The pedicle screws were placed.  6.5 mm Nuvasive Reline screws were used.   The L1 screws were successfully placed across the pedicle and below the fracture line and had excellent purchase.  Rods were measured to length, cut, and shaped. The rods were secured using locking caps to manufacturer's specifications.  By reducing the screws to the rods, the unstable L1 fracture was openly reduced and internally fixated.    Final AP and lateral radiographs were taken to confirm placement of instrumentation and appropriate alignment. The wound was copiously irrigated, then the external surfaces of the remaining lamina, facet, and transverse processes from T11 to L2 were decorticated. A mixture of allograft and autograft was placed over the decorticated surfaces for arthrodesis.  A drain was placed subfascially.   After hemostasis, the wound was closed in layers with 0 and 2-0 vicryl. Staples were used and a wound vac was applied to the incision.  The patient was then flipped supine and extubated with incident. All counts were correct times 2 at the end of the case. No immediate complications were noted.  Edsel Goods PA assisted in the entire procedure. An assistant was required for this procedure due to the complexity.  The assistant provided assistance in tissue manipulation and suction, and was required for the successful and safe performance of the procedure. I performed the critical portions of the procedure.   [name] MD

## 2024-09-18 NOTE — Transfer of Care (Signed)
 Immediate Anesthesia Transfer of Care Note  Patient: [name]  Procedure(s) Performed: POSTERIOR LUMBAR FUSION 4 LEVEL APPLICATION OF INTRAOPERATIVE CT SCAN  Patient Location: PACU  Anesthesia Type:General  Level of Consciousness: drowsy and patient cooperative  Airway & Oxygen Therapy: Patient Spontanous Breathing and Patient connected to face mask oxygen  Post-op Assessment: Report given to RN and Post -op Vital signs reviewed and stable  Post vital signs: stable  Last Vitals:  Vitals Value Taken Time  BP 122/56 09/18/24 15:41  Temp    Pulse 64 09/18/24 15:44  Resp 12 09/18/24 15:44  SpO2 98 % 09/18/24 15:44  Vitals shown include unfiled device data.  Last Pain:  Vitals:   09/18/24 1146  TempSrc: Oral  PainSc: 4          Complications: No notable events documented.

## 2024-09-18 NOTE — Anesthesia Procedure Notes (Signed)
 Procedure Name: Intubation Date/Time: 09/18/2024 1:14 PM  Performed by: Veronica Alm BROCKS, CRNAPre-anesthesia Checklist: Patient identified, Patient being monitored, Timeout performed, Emergency Drugs available and Suction available Patient Re-evaluated:Patient Re-evaluated prior to induction Oxygen Delivery Method: Circle system utilized Preoxygenation: Pre-oxygenation with 100% oxygen Induction Type: IV induction Ventilation: Mask ventilation without difficulty Laryngoscope Size: Glidescope, 4 and McGrath Grade View: Grade I Tube type: Oral Tube size: 7.0 mm Number of attempts: 1 Airway Equipment and Method: Stylet Placement Confirmation: ETT inserted through vocal cords under direct vision, positive ETCO2 and breath sounds checked- equal and bilateral Secured at: 20 cm Tube secured with: Tape Dental Injury: Teeth and Oropharynx as per pre-operative assessment

## 2024-09-18 NOTE — Interval H&P Note (Signed)
 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  The various methods of treatment have been discussed with the patient and family. After consideration of risks, benefits and other options for treatment, the patient has consented to  Procedure(s) with comments: POSTERIOR LUMBAR FUSION 4 LEVEL (N/A) - T11-L3 POSTERIOR SPINAL FUSION, OPEN REDUCTION INTERNAL FIXATION (ORIF) L1 FRACTURE APPLICATION OF INTRAOPERATIVE CT SCAN (N/A) as a surgical intervention.  The patient's history has been reviewed, patient examined, no change in status, stable for surgery.  I have reviewed the patient's chart and labs.  Questions were answered to the patient's satisfaction.    Heart sounds normal no MRG. Chest Clear to Auscultation Bilaterally.   [name]

## 2024-09-19 ENCOUNTER — Encounter: Payer: Self-pay | Admitting: Neurosurgery

## 2024-09-19 NOTE — Evaluation (Signed)
 Physical Therapy Evaluation Patient Details Name: [name] MRN: [id number] DOB: January 09, 1961 Today's Date: 09/19/2024  History of Present Illness  Pt is a 63 y/o M s/p L1 ORIF and Posterolateral arthrodesisT11-L2  Pt sustained a fall off of a 78ft ladder on 8/31 resulting in L1 vertebral body fx and L2-4 transverse process fx. PMH significant for HTN, GERD, HLD, hypothyroidism, pre-DM, prostate CA, hx of lung collapse.   Clinical Impression  Pt A&Ox4, agreeable to PT evaluation. Cited back pain of 3/10 intensity during session. At baseline, pt is IND with mobility and ADLs, hx of recent falls including the one related to current admission. Pt was met sitting at EOB with OT in room, pt able to don/doff hard shell TLSO in sitting with min-modA. STS from EOB and BSC with RW and CGA. Pt amb ~24ft with RW, no LOB, noted to maintain R ankle plantarflexed throughout amb resulting in compensatory limp, pt reports this is a chronic gait deviation. Pt was left seated in recliner at end of session, able to recall 1/3 back precautions with cueing. Pt would benefit from skilled PT intervention to address listed deficits (see PT Problem List) and ensure safe return to PLOF.         If plan is discharge home, recommend the following: A little help with walking and/or transfers;A little help with bathing/dressing/bathroom;Assistance with cooking/housework;Assist for transportation;Help with stairs or ramp for entrance   Can travel by private vehicle        Equipment Recommendations None recommended by PT (pt has recommended DME)  Recommendations for Other Services       Functional Status Assessment Patient has had a recent decline in their functional status and demonstrates the ability to make significant improvements in function in a reasonable and predictable amount of time.     Precautions / Restrictions Precautions Precautions: Back;Fall Precaution Booklet Issued: Yes (comment) Recall  of Precautions/Restrictions: Impaired Precaution/Restrictions Comments: pt able to recall 1/3 back precautions at end of session Required Braces or Orthoses: Spinal Brace Spinal Brace: Thoracolumbosacral orthotic;Applied in sitting position (for comfort when OOB, hard shell TLSO) Restrictions Weight Bearing Restrictions Per Provider Order: No      Mobility  Bed Mobility               General bed mobility comments: NT, pt sitting EOB at start of session, in recliner at end of session    Transfers Overall transfer level: Needs assistance Equipment used: Rolling walker (2 wheels) Transfers: Sit to/from Stand Sit to Stand: Contact guard assist           General transfer comment: STS from EOB and BSC with no physical assistance, VC for hand placement    Ambulation/Gait Ambulation/Gait assistance: Contact guard assist Gait Distance (Feet): 250 Feet Assistive device: Rolling walker (2 wheels) Gait Pattern/deviations: Step-through pattern, Narrow base of support       General Gait Details: pt noted to maintain R akle plantarflexed during amb, resulted in compensatory limp. No LOB, fast cadence, VC to keep RW close for maximum support  Stairs            Wheelchair Mobility     Tilt Bed    Modified Rankin (Stroke Patients Only)       Balance Overall balance assessment: Needs assistance Sitting-balance support: Feet supported Sitting balance-Leahy Scale: Good Sitting balance - Comments: steady static and dynamic sitting   Standing balance support: Bilateral upper extremity supported Standing balance-Leahy Scale: Good  Pertinent Vitals/Pain Pain Assessment Pain Assessment: 0-10 Pain Score: 3  Pain Location: back and R side Pain Descriptors / Indicators: Tingling Pain Intervention(s): Monitored during session, Repositioned    Home Living Family/patient expects to be discharged to:: Private residence Living  Arrangements: Spouse/significant other Available Help at Discharge: Family;Available PRN/intermittently Type of Home: Mobile home Home Access: Ramped entrance       Home Layout: One level Home Equipment: Agricultural consultant (2 wheels);Shower seat;BSC/3in1      Prior Function Prior Level of Function : Independent/Modified Independent;Driving             Mobility Comments: pt denies use of AD prior to surgery, IND with mobility ADLs Comments: IND with ADLs, driving     Extremity/Trunk Assessment   Upper Extremity Assessment Upper Extremity Assessment: Defer to OT evaluation    Lower Extremity Assessment Lower Extremity Assessment: Overall WFL for tasks assessed (some RLE trembling with knee flexion, limited active movement of R ankle)    Cervical / Trunk Assessment Cervical / Trunk Assessment: Back Surgery  Communication   Communication Communication: Impaired Factors Affecting Communication: Other (comment) (interpreter services used toward end of session. Pt speaks [place] and [place])    Cognition Arousal: Alert Behavior During Therapy: WFL for tasks assessed/performed   PT - Cognitive impairments: No apparent impairments                       PT - Cognition Comments: A&Ox4 Following commands: Intact       Cueing Cueing Techniques: Verbal cues     General Comments General comments (skin integrity, edema, etc.): wound vac and hemovac intact pre/post session    Exercises     Assessment/Plan    PT Assessment Patient needs continued PT services  PT Problem List Decreased balance;Decreased mobility;Decreased knowledge of use of DME;Decreased safety awareness;Decreased knowledge of precautions;Pain       PT Treatment Interventions DME instruction;Gait training;Functional mobility training;Therapeutic activities;Therapeutic exercise;Balance training;Neuromuscular re-education;Patient/family education    PT Goals (Current goals can be found in the Care  Plan section)  Acute Rehab PT Goals Patient Stated Goal: to go home PT Goal Formulation: With patient Time For Goal Achievement: 10/03/24 Potential to Achieve Goals: Good    Frequency 7X/week     Co-evaluation               AM-PAC PT 6 Clicks Mobility  Outcome Measure Help needed turning from your back to your side while in a flat bed without using bedrails?: A Little Help needed moving from lying on your back to sitting on the side of a flat bed without using bedrails?: A Little Help needed moving to and from a bed to a chair (including a wheelchair)?: A Little Help needed standing up from a chair using your arms (e.g., wheelchair or bedside chair)?: None Help needed to walk in hospital room?: A Little Help needed climbing 3-5 steps with a railing? : A Little 6 Click Score: 19    End of Session Equipment Utilized During Treatment: Back brace (hard shell TLSO) Activity Tolerance: Patient tolerated treatment well Patient left: in chair;with call bell/phone within reach Nurse Communication: Mobility status PT Visit Diagnosis: Other abnormalities of gait and mobility (R26.89);History of falling (Z91.81);Difficulty in walking, not elsewhere classified (R26.2);Pain Pain - part of body:  (back)    Time: 9052-8991 PT Time Calculation (min) (ACUTE ONLY): 21 min   Charges:   PT Evaluation $PT Eval Low Complexity: 1 Low PT Treatments $Therapeutic  Activity: 8-22 mins PT General Charges $$ ACUTE PT VISIT: 1 Visit         Syd Manges, SPT

## 2024-09-19 NOTE — Evaluation (Signed)
 Occupational Therapy Evaluation Patient Details Name: [name] MRN: [id number] DOB: 1961/02/19 Today's Date: 09/19/2024   History of Present Illness   Patient is a 63 year old male, s/p Open reduction and internal fixation of unstable L1 fracture, and Posterolateral arthrodesisT11-L2 on 09/18/24 following fall 08/25/24.     Clinical Impressions Pt seen for OT evaluation this date. PTA was independent with ADL/IADL, amb with no AD. Pt educated in precautions, self care skills, AE, and home/routines modifications to maximize safety and functional independence while minimizing falls risk and maintaining precautions. Pt verbalized understanding of all education/training provided. Able to return demonstration safe techniques while maintaining precautions throughout. Handout provided to support recall and carry over of learned precautions/techniques for bed mobility, functional transfers, and self care skills.  Patient performing ADLs/funct mobility below PLOF and will continue to benefit from OT services to address deficits and return to PLOF.     If plan is discharge home, recommend the following:   A little help with walking and/or transfers;A little help with bathing/dressing/bathroom;Assistance with cooking/housework;Assist for transportation     Functional Status Assessment   Patient has had a recent decline in their functional status and demonstrates the ability to make significant improvements in function in a reasonable and predictable amount of time.     Equipment Recommendations   Other (comment) (2WW)     Recommendations for Other Services         Precautions/Restrictions   Precautions Precautions: Back Precaution Booklet Issued: Yes (comment) Recall of Precautions/Restrictions: Intact Required Braces or Orthoses: Spinal Brace Spinal Brace: Applied in sitting position;Thoracolumbosacral orthotic;Other (comment) (for comfort OOB) Restrictions Weight  Bearing Restrictions Per Provider Order: No     Mobility Bed Mobility Overal bed mobility: Needs Assistance Bed Mobility: Rolling, Sidelying to Sit (cues for log rolling) Rolling: Contact guard assist (cues for log rolling) Sidelying to sit: Supervision            Transfers Overall transfer level: Needs assistance Equipment used: Rolling walker (2 wheels) Transfers: Sit to/from Stand Sit to Stand: Contact guard assist                  Balance Overall balance assessment: Needs assistance Sitting-balance support: Feet supported Sitting balance-Leahy Scale: Good     Standing balance support: Bilateral upper extremity supported, During functional activity Standing balance-Leahy Scale: Good                             ADL either performed or assessed with clinical judgement   ADL Overall ADL's : Needs assistance/impaired                 Upper Body Dressing : Set up Upper Body Dressing Details (indicate cue type and reason): max A for TLSO sitting EOB Lower Body Dressing: Contact guard assist;Adhering to back precautions Lower Body Dressing Details (indicate cue type and reason): adhering to spinal precautions, able to thread BLE with figure 4 to don underpants/shorts, stood up with CGA to adjust clothing Toilet Transfer: Contact guard assist;Rolling walker (2 wheels);BSC/3in1 (BSC over toilet)           Functional mobility during ADLs: Contact guard assist;Rolling walker (2 wheels);Cueing for sequencing;Cueing for safety       Vision Baseline Vision/History: 1 Wears glasses Patient Visual Report: Other (comment) (appears WFL, no concerns noted) Vision Assessment?: No apparent visual deficits     Perception  Praxis         Pertinent Vitals/Pain Pain Assessment Pain Assessment: 0-10 Pain Score: 3  Pain Location: back and R side Pain Descriptors / Indicators: Tingling Pain Intervention(s): Monitored during session      Extremity/Trunk Assessment Upper Extremity Assessment Upper Extremity Assessment: Overall WFL for tasks assessed   Lower Extremity Assessment Lower Extremity Assessment: Defer to PT evaluation   Cervical / Trunk Assessment Cervical / Trunk Assessment: Back Surgery   Communication Communication Communication: Other (comment) (patient initially declined interpreter services, interpreter made available if needed during session) Factors Affecting Communication: Other (comment) (ipad interpreter utilized germain [id number])   Cognition Arousal: Alert Behavior During Therapy: WFL for tasks assessed/performed Cognition: No apparent impairments                               Following commands: Intact       Cueing  General Comments   Cueing Techniques: Verbal cues  wound vac and hemovac intact pre/post session   Exercises     Shoulder Instructions      Home Living Family/patient expects to be discharged to:: Private residence Living Arrangements: Spouse/significant other Available Help at Discharge: Family;Available PRN/intermittently Type of Home:  (trailer) Home Access: Ramped entrance     Home Layout: One level     Bathroom Shower/Tub: Walk-in shower;Tub/shower unit         Home Equipment: Agricultural consultant (2 wheels);Shower seat;BSC/3in1          Prior Functioning/Environment Prior Level of Function : Independent/Modified Independent;Driving                    OT Problem List: Decreased activity tolerance;Decreased strength;Decreased safety awareness;Impaired balance (sitting and/or standing);Decreased knowledge of use of DME or AE;Decreased knowledge of precautions   OT Treatment/Interventions: Therapeutic exercise;Self-care/ADL training;DME and/or AE instruction;Therapeutic activities;Patient/family education;Balance training      OT Goals(Current goals can be found in the care plan section)   Acute Rehab OT Goals Patient Stated  Goal: to go home OT Goal Formulation: With patient Time For Goal Achievement: 10/03/24 Potential to Achieve Goals: Good ADL Goals Pt Will Perform Grooming: with modified independence;sitting;standing Pt Will Perform Lower Body Dressing: with modified independence;with adaptive equipment Pt Will Transfer to Toilet: with modified independence;ambulating Pt Will Perform Toileting - Clothing Manipulation and hygiene: with modified independence;with adaptive equipment;sit to/from stand   OT Frequency:  Min 3X/week    Co-evaluation PT/OT/SLP Co-Evaluation/Treatment:  (handoff to PT during mobility)            AM-PAC OT 6 Clicks Daily Activity     Outcome Measure Help from another person eating meals?: None Help from another person taking care of personal grooming?: A Little Help from another person toileting, which includes using toliet, bedpan, or urinal?: A Little Help from another person bathing (including washing, rinsing, drying)?: A Little Help from another person to put on and taking off regular upper body clothing?: A Little Help from another person to put on and taking off regular lower body clothing?: A Little 6 Click Score: 19   End of Session Equipment Utilized During Treatment: Rolling walker (2 wheels);Back brace Nurse Communication: Mobility status  Activity Tolerance: Patient tolerated treatment well Patient left: Other (comment) (in care of PT ambulating in hallway)  OT Visit Diagnosis: Other abnormalities of gait and mobility (R26.89)                Time:  9061-8999 OT Time Calculation (min): 22 min Charges:  OT General Charges $OT Visit: 1 Visit OT Evaluation $OT Eval Low Complexity: 1 Low  Rogers Clause, OT/L MSOT  Maryelizabeth CHRISTELLA Clause 09/19/2024, 10:43 AM

## 2024-09-19 NOTE — Plan of Care (Signed)
  Problem: Pain Managment: Goal: General experience of comfort will improve and/or be controlled Outcome: Progressing   Problem: Safety: Goal: Ability to remain free from injury will improve Outcome: Progressing

## 2024-09-19 NOTE — Plan of Care (Signed)
  Problem: Pain Managment: Goal: General experience of comfort will improve and/or be controlled Outcome: Progressing

## 2024-09-19 NOTE — Progress Notes (Signed)
 Attending Progress Note  History: [name] is s/p Open reduction and internal fixation of unstable L1 fracture, and Posterolateral arthrodesisT11-L2, they are currently 1 Day Post-Op.   POD1: Pt doing well this morning without concerns.   Physical Exam: Vitals:   09/18/24 2312 09/19/24 0503  BP: 120/79 121/79  Pulse: 64 72  Resp: 16 18  Temp: (!) 96.8 F (36 C) (!) 97.2 F (36.2 C)  SpO2: 97% 94%    AA Ox3 CNI  Strength:5/5 throughout BLE   Incision covered with wound vac  HV: 190 mL since surgery.   Data:  Recent Labs  Lab 09/13/24 1256  NA 137  K 4.4  CL 100  CO2 23  BUN 25*  CREATININE 1.19  GLUCOSE 94  CALCIUM  9.5   No results for input(s): AST, ALT, ALKPHOS in the last 168 hours.  Invalid input(s): TBILI   Recent Labs  Lab 09/13/24 1256  WBC 8.9  HGB 16.3  HCT 50.0  PLT 367   No results for input(s): APTT, INR in the last 168 hours.      DG Lumbar Spine 2-3 Views Result Date: 09/18/2024 CLINICAL DATA:  T11-L3 posterior fusion EXAM: LUMBAR SPINE - 2-3 VIEW COMPARISON:  None Available. FINDINGS: Multiple intraoperative spot images are submitted. These demonstrate posterior fusion changes from what appears to be T11-L2. No hardware complicating feature. IMPRESSION: Intraoperative imaging for posterior thoracolumbar fusion. Electronically Signed   By: Franky Crease M.D.   On: 09/18/2024 20:44   DG C-Arm 1-60 Min-No Report Result Date: 09/18/2024 Fluoroscopy was utilized by the requesting physician.  No radiographic interpretation.   DG C-Arm 1-60 Min-No Report Result Date: 09/18/2024 Fluoroscopy was utilized by the requesting physician.  No radiographic interpretation.    Other tests/results: NA  Assessment/Plan:  [name] is a 63 y.o presenting L1 fracture s/p T11-L2.   - Will continue to monitor HV output and wound vac.  - mobilize - pain control - DVT prophylaxis - PTOT  Edsel Goods  PA-C Department of Neurosurgery

## 2024-09-20 ENCOUNTER — Other Ambulatory Visit: Payer: Self-pay

## 2024-09-20 MED ORDER — SENNA 8.6 MG PO TABS
1.0000 | ORAL_TABLET | Freq: Two times a day (BID) | ORAL | 0 refills | Status: DC
  Filled 2024-09-20: qty 60, 30d supply, fill #0

## 2024-09-20 MED ORDER — CELECOXIB 200 MG PO CAPS
200.0000 mg | ORAL_CAPSULE | Freq: Two times a day (BID) | ORAL | 0 refills | Status: DC
  Filled 2024-09-20: qty 60, 30d supply, fill #0

## 2024-09-20 MED ORDER — CYCLOBENZAPRINE HCL 10 MG PO TABS
10.0000 mg | ORAL_TABLET | Freq: Three times a day (TID) | ORAL | 0 refills | Status: DC | PRN
  Filled 2024-09-20: qty 60, 20d supply, fill #0

## 2024-09-20 MED ORDER — OXYCODONE HCL 5 MG PO TABS
5.0000 mg | ORAL_TABLET | ORAL | 0 refills | Status: DC | PRN
  Filled 2024-09-20: qty 30, 5d supply, fill #0

## 2024-09-20 NOTE — Progress Notes (Signed)
 Walker delivered. Family transporting patient home.

## 2024-09-20 NOTE — Progress Notes (Signed)
 Neurosurgery Progress Note  History: [name] is here s/p Open reduction and internal fixation of unstable L1 fracture, and Posterolateral arthrodesisT11-L2. They are currently 2 days Post-Op.  POD1:  Pt doing well this morning without concerns.  POD2:  Pt in good spirits and is eager to get home.  Physical Exam: Vitals:   09/19/24 1650 09/20/24 0442  BP: 139/87 116/80  Pulse: 90 91  Resp: 18 18  Temp: 98 F (36.7 C) 97.9 F (36.6 C)  SpO2: 100% 94%    AA Ox3 CNI  Strength:5/5 throughout BLE Incision: C/D/I  HV: over 24hrs + 10mL overnight  Data:  Other tests/results: NA  Assessment/Plan:  [name] is a 63 y.o presenting L1 fracture s/p T11-L2.   - dc drain - mobilize - pain control - DVT prophylaxis - PTOT: continue working   [name] MD, [facility] Department of Neurosurgery

## 2024-09-20 NOTE — Progress Notes (Signed)
 Physical Therapy Treatment Patient Details Name: [name] MRN: [id number] DOB: 12-17-1961 Today's Date: 09/20/2024   History of Present Illness Pt is a 63 y/o M s/p L1 ORIF and Posterolateral arthrodesisT11-L2  Pt sustained a fall off of a 41ft ladder on 8/31 resulting in L1 vertebral body fx and L2-4 transverse process fx. PMH significant for HTN, GERD, HLD, hypothyroidism, pre-DM, prostate CA, hx of lung collapse.    PT Comments  Pt A&Ox4, agreeable to participate in PT tx, denied pain throughout session. Interpreter utilized during session. Pt recalled 3/3 back precautions at start of session without cues. Pt met sitting in recliner, supervision for all transfers this date. Pt amb ~233ft with RW, improved R toe walking and compensatory limp this session when pt amb with shoes donned, no LOB. Pt able to stand to doff TLSO without UE support with supervision and steady balance. Pt left sitting in recliner at end of session, all needs in reach. The patient would benefit from further skilled PT intervention to continue to progress towards goals.      If plan is discharge home, recommend the following: A little help with walking and/or transfers;A little help with bathing/dressing/bathroom;Assistance with cooking/housework;Assist for transportation;Help with stairs or ramp for entrance   Can travel by private vehicle        Equipment Recommendations  None recommended by PT    Recommendations for Other Services       Precautions / Restrictions Precautions Precautions: Back;Fall Precaution Booklet Issued: Yes (comment) Recall of Precautions/Restrictions: Intact Precaution/Restrictions Comments: able to recall 3/3 back precautions at start of session without cueing Required Braces or Orthoses: Spinal Brace Spinal Brace: Thoracolumbosacral orthotic;Applied in sitting position (for comfort when OOB, hard shell TLSO) Restrictions Weight Bearing Restrictions Per Provider Order:  No     Mobility  Bed Mobility               General bed mobility comments: NT, pt in recliner at start/end of session    Transfers Overall transfer level: Needs assistance Equipment used: Rolling walker (2 wheels) Transfers: Sit to/from Stand Sit to Stand: Supervision           General transfer comment: no physical assistance for STS from recliner    Ambulation/Gait Ambulation/Gait assistance: Contact guard assist Gait Distance (Feet): 250 Feet Assistive device: Rolling walker (2 wheels) Gait Pattern/deviations: Step-through pattern, Narrow base of support       General Gait Details: improved R toe walking with shoes donned for ambulation, no LOB   Stairs             Wheelchair Mobility     Tilt Bed    Modified Rankin (Stroke Patients Only)       Balance Overall balance assessment: Needs assistance Sitting-balance support: Feet supported Sitting balance-Leahy Scale: Good     Standing balance support: Bilateral upper extremity supported, No upper extremity supported Standing balance-Leahy Scale: Good Standing balance comment: able to doff TLSO in standing with steady balance                            Communication Communication Communication: Impaired Factors Affecting Communication: Other (comment) (interpreter used in session. Pt speaks Spanish and [place])  Cognition Arousal: Alert Behavior During Therapy: WFL for tasks assessed/performed   PT - Cognitive impairments: No apparent impairments  PT - Cognition Comments: A&Ox4 Following commands: Intact      Cueing Cueing Techniques: Verbal cues  Exercises Other Exercises Other Exercises: pt educated on home management of wound vac cord for proper positioning when donning TLSO    General Comments        Pertinent Vitals/Pain Pain Assessment Pain Assessment: No/denies pain    Home Living                          Prior  Function            PT Goals (current goals can now be found in the care plan section) Progress towards PT goals: Progressing toward goals    Frequency    7X/week      PT Plan      Co-evaluation              AM-PAC PT 6 Clicks Mobility   Outcome Measure  Help needed turning from your back to your side while in a flat bed without using bedrails?: A Little Help needed moving from lying on your back to sitting on the side of a flat bed without using bedrails?: A Little Help needed moving to and from a bed to a chair (including a wheelchair)?: A Little Help needed standing up from a chair using your arms (e.g., wheelchair or bedside chair)?: None Help needed to walk in hospital room?: A Little Help needed climbing 3-5 steps with a railing? : A Little 6 Click Score: 19    End of Session Equipment Utilized During Treatment: Back brace (hard shell TLSO) Activity Tolerance: Patient tolerated treatment well Patient left: in chair;with call bell/phone within reach Nurse Communication: Mobility status PT Visit Diagnosis: Other abnormalities of gait and mobility (R26.89);History of falling (Z91.81);Difficulty in walking, not elsewhere classified (R26.2);Pain Pain - part of body:  (back)     Time: 9151-9143 PT Time Calculation (min) (ACUTE ONLY): 8 min  Charges:    $Therapeutic Activity: 8-22 mins PT General Charges $$ ACUTE PT VISIT: 1 Visit                     Vicke Plotner, SPT

## 2024-09-20 NOTE — Progress Notes (Signed)
 Patient discharging home. Instructions given to patient, verbalized understanding. IV removed. Dressing intact on incision site, waiting on walker to be delivered.

## 2024-09-20 NOTE — Plan of Care (Signed)
   Problem: Education: Goal: Knowledge of General Education information will improve Description Including pain rating scale, medication(s)/side effects and non-pharmacologic comfort measures Outcome: Progressing

## 2024-09-20 NOTE — Discharge Summary (Signed)
 Physician Discharge Summary  Patient ID: [name] MRN: [id number] DOB/AGE: 65/11/1961 65 y.o.  Admit date: 09/18/2024 Discharge date: 09/20/2024  Admission Diagnoses:   Closed tricolumnar fracture of lumbar vertebra [facility])   Spinal instability of lumbar region  Discharge Diagnoses:  Principal Problem:   S/P lumbar fusion Active Problems:   Closed tricolumnar fracture of lumbar vertebra [facility])   Spinal instability of lumbar region   Discharged Condition: good  Hospital Course: [name] presented for operative fixation of an unstable fracture.  He tolerated the procedure well and was admitted for postoperative management.  He was stable on postoperative day 2 and had been cleared by physical and Occupational Therapy for discharge.  Consults: None  Significant Diagnostic Studies: radiology: X-Ray: localization during surgery  Treatments: surgery: T11-L2 posterior fusion  Discharge Exam: Blood pressure 116/80, pulse 91, temperature 97.9 F (36.6 C), temperature source Temporal, resp. rate 18, height 5' 6 (1.676 m), weight 75 kg, SpO2 94%. General appearance: alert and cooperative CNI MAEW Dressing c/d/I   Disposition: Discharge disposition: 01-Home or Self Care       Discharge Instructions     Discharge patient   Complete by: As directed    Discharge disposition: 01-Home or Self Care   Discharge patient date: 09/20/2024   Face-to-face encounter (required for Medicare/Medicaid patients)   Complete by: As directed    I [name] certify that this patient is under my care and that I, or a nurse practitioner or physician's assistant working with me, had a face-to-face encounter that meets the physician face-to-face encounter requirements with this patient on 09/20/2024. The encounter with the patient was in whole, or in part for the following medical condition(s) which is the primary reason for home health care (List medical condition): unstable  spinal fracture   The encounter with the patient was in whole, or in part, for the following medical condition, which is the primary reason for home health care: unstable spinal fracture   I certify that, based on my findings, the following services are medically necessary home health services: Physical therapy   Reason for Medically Necessary Home Health Services: Therapy- Home Adaptation to Facilitate Safety   My clinical findings support the need for the above services: Unable to leave home safely without assistance and/or assistive device   Further, I certify that my clinical findings support that this patient is homebound due to: Pain interferes with ambulation/mobility   Home Health   Complete by: As directed    To provide the following care/treatments:  PT OT     Incentive spirometry RT   Complete by: As directed       Allergies as of 09/20/2024       Reactions   Methocarbamol  Other (See Comments)   Indigestion/heartburn        Medication List     STOP taking these medications    naproxen  500 MG tablet Commonly known as: NAPROSYN        TAKE these medications    aspirin EC 81 MG tablet Take 81 mg by mouth daily. Swallow whole.   atorvastatin  20 MG tablet Commonly known as: LIPITOR TAKE 1 TABLET BY MOUTH DAILY What changed:  how much to take how to take this when to take this additional instructions   bisacodyl 5 MG EC tablet Commonly known as: DULCOLAX Take 5 mg by mouth at bedtime.   calcitonin (salmon) 200 UNIT/ACT nasal spray Commonly known as: MIACALCIN /FORTICAL Place 1 spray into alternate nostrils daily.  celecoxib  200 MG capsule Commonly known as: CeleBREX  Take 1 capsule (200 mg total) by mouth 2 (two) times daily.   cyclobenzaprine  10 MG tablet Commonly known as: FLEXERIL  Take 1 tablet (10 mg total) by mouth 3 (three) times daily as needed for muscle spasms. This can make you sleepy.   levothyroxine  75 MCG tablet Commonly known as:  SYNTHROID  Take 1 tablet (75 mcg total) by mouth daily before breakfast. What changed: when to take this   olmesartan  20 MG tablet Commonly known as: BENICAR  TAKE 1 TABLET(20 MG) BY MOUTH DAILY What changed:  how much to take how to take this when to take this additional instructions   oxyCODONE  5 MG immediate release tablet Commonly known as: Oxy IR/ROXICODONE  Take 1 tablet (5 mg total) by mouth every 4 (four) hours as needed for moderate pain (pain score 4-6).   senna 8.6 MG Tabs tablet Commonly known as: SENOKOT Take 1 tablet (8.6 mg total) by mouth 2 (two) times daily.        Follow-up Information     Hilma Hastings, PA-C Follow up on 10/03/2024.   Specialty: Neurosurgery Why: 0930 Contact information: [address] [place] [address] [phone]                 Signed: [name] 09/20/2024, 7:19 AM

## 2024-09-20 NOTE — Progress Notes (Signed)
 Occupational Therapy Treatment Patient Details Name: [name] MRN: [id number] DOB: 05-09-1961 Today's Date: 09/20/2024   History of present illness Pt is a 63 y/o M s/p L1 ORIF and Posterolateral arthrodesisT11-L2  Pt sustained a fall off of a 40ft ladder on 8/31 resulting in L1 vertebral body fx and L2-4 transverse process fx. PMH significant for HTN, GERD, HLD, hypothyroidism, pre-DM, prostate CA, hx of lung collapse.   OT comments  Chart reviewed to date, pt greeted amb in room with RW. He reports (and nurse confirms) dressing with MOD I. He is provided back precautions hand out in spanish (originally requested english yesterday) to facilitate ongoing carry over of safe/optimal ADL/functional mobility. Pt is left in chair, all needs met. OT will follow.       If plan is discharge home, recommend the following:  A little help with walking and/or transfers;A little help with bathing/dressing/bathroom;Assistance with cooking/housework;Assist for transportation   Equipment Recommendations  Other (comment) (2WW)    Recommendations for Other Services      Precautions / Restrictions Precautions Precautions: Back;Fall Precaution Booklet Issued: Yes (comment) Recall of Precautions/Restrictions: Intact Precaution/Restrictions Comments: provided back precaution hand out in spanish Required Braces or Orthoses: Spinal Brace Spinal Brace: Thoracolumbosacral orthotic (for comfort with OOB) Restrictions Weight Bearing Restrictions Per Provider Order: No       Mobility Bed Mobility                    Transfers                   General transfer comment: STS with supervision with RW     Balance Overall balance assessment: Needs assistance Sitting-balance support: Feet supported Sitting balance-Leahy Scale: Good     Standing balance support: Bilateral upper extremity supported Standing balance-Leahy Scale: Good                              ADL either performed or assessed with clinical judgement   ADL Overall ADL's : Needs assistance/impaired                                       General ADL Comments: pt/nurse reports he performed dressing with MOD I    Extremity/Trunk Assessment              Vision       Perception     Praxis     Communication Communication Communication: Impaired Factors Affecting Communication:  (pt declined interpreter)   Cognition Arousal: Alert Behavior During Therapy: WFL for tasks assessed/performed Cognition: No apparent impairments                               Following commands: Intact        Cueing   Cueing Techniques: Verbal cues  Exercises Other Exercises Other Exercises: provided back precaution hand out in spanish, pt with no further questions    Shoulder Instructions       General Comments      Pertinent Vitals/ Pain       Pain Assessment Pain Assessment: No/denies pain  Home Living  Prior Functioning/Environment              Frequency  Min 3X/week        Progress Toward Goals  OT Goals(current goals can now be found in the care plan section)  Progress towards OT goals: Progressing toward goals  Acute Rehab OT Goals Time For Goal Achievement: 10/03/24  Plan      Co-evaluation                 AM-PAC OT 6 Clicks Daily Activity     Outcome Measure   Help from another person eating meals?: None Help from another person taking care of personal grooming?: None Help from another person toileting, which includes using toliet, bedpan, or urinal?: None Help from another person bathing (including washing, rinsing, drying)?: A Little Help from another person to put on and taking off regular upper body clothing?: None Help from another person to put on and taking off regular lower body clothing?: A Little 6 Click Score: 22    End of Session  Equipment Utilized During Treatment: Rolling walker (2 wheels)  OT Visit Diagnosis: Other abnormalities of gait and mobility (R26.89)   Activity Tolerance Patient tolerated treatment well   Patient Left in chair;with call bell/phone within reach   Nurse Communication Mobility status        Time: 9059-9051 OT Time Calculation (min): 8 min  Charges: OT General Charges $OT Visit: 1 Visit OT Treatments $Therapeutic Activity: 8-22 mins  Therisa Sheffield, OTD OTR/L  09/20/24, 10:58 AM

## 2024-09-20 NOTE — TOC Transition Note (Addendum)
 Transition of Care [facility]) - Discharge Note   Patient Details  Name: [name] MRN: [id number] Date of Birth: September 23, 1961  Transition of Care [facility]) CM/SW Contact:  [place] L Harveen Flesch, LCSW Phone Number: 09/20/2024, 9:35 AM   Clinical Narrative:     RW ordered through [employer] for room delivery. Order confirmation received from [facility].         Patient Goals and CMS Choice            Discharge Placement                       Discharge Plan and Services Additional resources added to the After Visit Summary for                                       Social Drivers of Health (SDOH) Interventions SDOH Screenings   Food Insecurity: No Food Insecurity (09/18/2024)  Housing: Low Risk  (09/18/2024)  Transportation Needs: No Transportation Needs (09/18/2024)  Utilities: Not At Risk (09/18/2024)  Depression (PHQ2-9): Low Risk  (08/30/2024)  Financial Resource Strain: Low Risk  (11/04/2022)  Tobacco Use: Medium Risk (09/18/2024)     Readmission Risk Interventions     No data to display

## 2024-09-23 NOTE — Anesthesia Postprocedure Evaluation (Signed)
 Anesthesia Post Note  Patient: [name]  Procedure(s) Performed: POSTERIOR LUMBAR FUSION 4 LEVEL APPLICATION OF INTRAOPERATIVE CT SCAN  Patient location during evaluation: PACU Anesthesia Type: General Level of consciousness: awake and alert Pain management: pain level controlled Vital Signs Assessment: post-procedure vital signs reviewed and stable Respiratory status: spontaneous breathing, nonlabored ventilation, respiratory function stable and patient connected to nasal cannula oxygen Cardiovascular status: blood pressure returned to baseline and stable Postop Assessment: no apparent nausea or vomiting Anesthetic complications: no   There were no known notable events for this encounter.   Last Vitals:  Vitals:   09/20/24 0442 09/20/24 0823  BP: 116/80 110/72  Pulse: 91 (!) 101  Resp: 18 18  Temp: 36.6 C 36.7 C  SpO2: 94% 97%    Last Pain:  Vitals:   09/20/24 0823  TempSrc: Oral  PainSc:                  [name]

## 2024-09-24 ENCOUNTER — Ambulatory Visit

## 2024-09-24 ENCOUNTER — Other Ambulatory Visit: Payer: Self-pay

## 2024-09-24 DIAGNOSIS — Z4689 Encounter for fitting and adjustment of other specified devices: Secondary | ICD-10-CM

## 2024-09-24 DIAGNOSIS — Z09 Encounter for follow-up examination after completed treatment for conditions other than malignant neoplasm: Secondary | ICD-10-CM

## 2024-09-24 DIAGNOSIS — S32009D Unspecified fracture of unspecified lumbar vertebra, subsequent encounter for fracture with routine healing: Secondary | ICD-10-CM

## 2024-09-24 NOTE — Progress Notes (Signed)
 Procedure: Open reduction and internal fixation of unstable L1 fracture, and Posterolateral arthrodesisT11-L2 on 09/18/2024 by Dr Clois.  [name] presents today for removal of his wound vac. Upon arrival, his wife reports that the wound vac turned off while they were traveling to our office. The wound vac chamber is empty. His incision is clean, dry, and intact. However, there is a skin tear noted distal to his incision. This was discussed with Dr Clois and he and his wife were instructed to do daily dressing changes with antibiotic ointment for this.   His incision and skin tear were covered with primapore prior to discharge.  He is scheduled for follow up on 10/03/24. I encouraged them to contact our office with questions or concerns in the interim.     Rockford Leinen, RN [facility] Neurosurgery at [facility]

## 2024-09-30 NOTE — Progress Notes (Unsigned)
   REFERRING PHYSICIAN:  Justus Leita DEL, Md [address] [place],  [place] [address]  Interpreter present- he understands a lot of english but she helped when needed.   DOS: 09/18/24  PSF T11-L2  HISTORY OF PRESENT ILLNESS: [name] is approximately 2 weeks status post above surgery. Was given celebrex , flexeril , and oxycodone  on discharge from the hospital.   He was seen on 09/24/24 to have wound vac removed. Skin tear noted inferior to incision and he was to do daily dressing changes for this.   He has some numbness in his buttocks region. His back feels tight and sore. Overall, he is doing well. Pain is better than it was preop. No arm or leg pain. No weakness.      PHYSICAL EXAMINATION:  General: Patient is well developed, well nourished, calm, collected, and in no apparent distress.   NEUROLOGICAL:  General: In no acute distress.   Awake, alert, oriented to person, place, and time.  Pupils equal round and reactive to light.  Facial tone is symmetric.     Strength:  Side Biceps Triceps Deltoid Interossei Grip Wrist Ext. Wrist Flex.  R 5 5 5 5 5 5 5   L 5 5 5 5 5 5 5              Side Iliopsoas Quads Hamstring PF DF EHL  R 5 5 5 5 5 5   L 5 5 5 5 5 5    Incision c/d/I, staples removed without complication  Skin tear is healing well. No open wounds.    ROS (Neurologic):  Negative except as noted above  IMAGING: Nothing new to review.   ASSESSMENT/PLAN:  [name] is doing well s/p above surgery. Treatment options reviewed with patient and following plan made:   - I have advised the patient to lift up to 10 pounds until 6 weeks after surgery (follow up with Dr. Clois).  - Reviewed wound care.  - No bending, twisting, or lifting.  - Continue on current medications including prn oxycodone  and flexeril . No refills needed.   - Follow up as scheduled in 4 weeks and prn.   Advised to contact the office if any questions or  concerns arise.  Glade Boys PA-C Department of neurosurgery

## 2024-10-03 ENCOUNTER — Encounter: Payer: Self-pay | Admitting: Orthopedic Surgery

## 2024-10-03 ENCOUNTER — Ambulatory Visit (INDEPENDENT_AMBULATORY_CARE_PROVIDER_SITE_OTHER): Admitting: Orthopedic Surgery

## 2024-10-03 VITALS — BP 134/78 | Temp 98.6°F | Ht 66.0 in | Wt 165.0 lb

## 2024-10-03 DIAGNOSIS — S32009A Unspecified fracture of unspecified lumbar vertebra, initial encounter for closed fracture: Secondary | ICD-10-CM

## 2024-10-03 DIAGNOSIS — M532X6 Spinal instabilities, lumbar region: Secondary | ICD-10-CM

## 2024-10-03 DIAGNOSIS — Z981 Arthrodesis status: Secondary | ICD-10-CM

## 2024-10-03 DIAGNOSIS — S32009D Unspecified fracture of unspecified lumbar vertebra, subsequent encounter for fracture with routine healing: Secondary | ICD-10-CM

## 2024-10-09 ENCOUNTER — Other Ambulatory Visit: Payer: Self-pay | Admitting: Internal Medicine

## 2024-10-09 DIAGNOSIS — E039 Hypothyroidism, unspecified: Secondary | ICD-10-CM

## 2024-10-09 DIAGNOSIS — I10 Essential (primary) hypertension: Secondary | ICD-10-CM

## 2024-10-09 DIAGNOSIS — E782 Mixed hyperlipidemia: Secondary | ICD-10-CM

## 2024-10-10 NOTE — Telephone Encounter (Signed)
 Requested Prescriptions  Pending Prescriptions Disp Refills   levothyroxine  (SYNTHROID ) 75 MCG tablet [Pharmacy Med Name: LEVOTHYROXINE  0.075MG  ( ) TABS] 90 tablet 0    Sig: TAKE 1 TABLET(75 MCG) BY MOUTH DAILY     Endocrinology:  Hypothyroid Agents Failed - 10/10/2024  5:49 PM      Failed - TSH in normal range and within 360 days    TSH  Date Value Ref Range Status  05/07/2024 6.740 (H) 0.450 - 4.500 uIU/mL Final         Passed - Valid encounter within last 12 months    Recent Outpatient Visits           1 month ago Closed compression fracture of lumbosacral spine, initial encounter [facility])   [place] [facility] at [facility], Selinda PARAS, MD   5 months ago Essential hypertension   [employer] Primary Care & Sports Medicine at [facility], Leita DEL, MD       Future Appointments             In 1 month Ryan Leita DEL, MD [facility] Primary Care & Sports Medicine at [facility], [phone] [place]            Refused Prescriptions Disp Refills   olmesartan  (BENICAR ) 20 MG tablet [Pharmacy Med Name: OLMESARTAN  MEDOXOMIL 20MG  TABLETS] 90 tablet 0    Sig: TAKE 1 TABLET(20 MG) BY MOUTH DAILY     Cardiovascular:  Angiotensin Receptor Blockers Passed - 10/10/2024  5:49 PM      Passed - Cr in normal range and within 180 days    Creatinine, Ser  Date Value Ref Range Status  09/13/2024 1.19 0.61 - 1.24 mg/dL Final         Passed - K in normal range and within 180 days    Potassium  Date Value Ref Range Status  09/13/2024 4.4 3.5 - 5.1 mmol/L Final         Passed - Patient is not pregnant      Passed - Last BP in normal range    BP Readings from Last 1 Encounters:  10/03/24 134/78         Passed - Valid encounter within last 6 months    Recent Outpatient Visits           1 month ago Closed compression fracture of lumbosacral spine, initial encounter [facility])   [place] [facility] at [facility], Selinda PARAS, MD   5 months ago Essential hypertension   [place] Primary Care & Sports Medicine at [facility], Leita DEL, MD       Future Appointments             In 1 month Ryan Leita DEL, MD [facility] Primary Care & Sports Medicine at [facility], [phone] [place]             atorvastatin  (LIPITOR) 20 MG tablet [Pharmacy Med Name: ATORVASTATIN  20MG  TABLETS] 90 tablet 1    Sig: TAKE 1 TABLET BY MOUTH DAILY     Cardiovascular:  Antilipid - Statins Failed - 10/10/2024  5:49 PM      Failed - Lipid Panel in normal range within the last 12 months    Cholesterol, Total  Date Value Ref Range Status  11/08/2023 174 100 - 199 mg/dL Final   LDL Chol Calc (NIH)  Date Value Ref Range Status  11/08/2023 103 (H) 0 - 99  mg/dL Final   HDL  Date Value Ref Range Status  11/08/2023 41 >39 mg/dL Final   Triglycerides  Date Value Ref Range Status  11/08/2023 172 (H) 0 - 149 mg/dL Final         Passed - Patient is not pregnant      Passed - Valid encounter within last 12 months    Recent Outpatient Visits           1 month ago Closed compression fracture of lumbosacral spine, initial encounter [facility])    Primary Care & Sports Medicine at [facility], Selinda PARAS, MD   5 months ago Essential hypertension   [facility] Primary Care & Sports Medicine at [facility], Leita DEL, MD       Future Appointments             In 1 month Ryan, Leita DEL, MD [facility] Primary Care & Sports Medicine at [facility], [phone] [place]

## 2024-10-10 NOTE — Telephone Encounter (Signed)
 Requested medications are due for refill today.  yes  Requested medications are on the active medications list.  yes  Last refill. 09/07/2024 #90 0 rf  Future visit scheduled.   yes  Notes to clinic.  Abnormal labs    Requested Prescriptions  Pending Prescriptions Disp Refills   levothyroxine  (SYNTHROID ) 75 MCG tablet [Pharmacy Med Name: LEVOTHYROXINE  0.075MG  ( ) TABS] 90 tablet 0    Sig: TAKE 1 TABLET(75 MCG) BY MOUTH DAILY     Endocrinology:  Hypothyroid Agents Failed - 10/10/2024  5:50 PM      Failed - TSH in normal range and within 360 days    TSH  Date Value Ref Range Status  05/07/2024 6.740 (H) 0.450 - 4.500 uIU/mL Final         Passed - Valid encounter within last 12 months    Recent Outpatient Visits           1 month ago Closed compression fracture of lumbosacral spine, initial encounter [facility])   [place] [facility] at [facility], Selinda PARAS, MD   5 months ago Essential hypertension   [place] Primary Care & Sports Medicine at [facility], Leita DEL, MD       Future Appointments             In 1 month Justus Leita DEL, MD [facility] Primary Care & Sports Medicine at [facility], [phone] [place]            Refused Prescriptions Disp Refills   olmesartan  (BENICAR ) 20 MG tablet [Pharmacy Med Name: OLMESARTAN  MEDOXOMIL 20MG  TABLETS] 90 tablet 0    Sig: TAKE 1 TABLET(20 MG) BY MOUTH DAILY     Cardiovascular:  Angiotensin Receptor Blockers Passed - 10/10/2024  5:50 PM      Passed - Cr in normal range and within 180 days    Creatinine, Ser  Date Value Ref Range Status  09/13/2024 1.19 0.61 - 1.24 mg/dL Final         Passed - K in normal range and within 180 days    Potassium  Date Value Ref Range Status  09/13/2024 4.4 3.5 - 5.1 mmol/L Final         Passed - Patient is not pregnant      Passed - Last BP in normal range    BP Readings from Last 1 Encounters:  10/03/24 134/78         Passed  - Valid encounter within last 6 months    Recent Outpatient Visits           1 month ago Closed compression fracture of lumbosacral spine, initial encounter [facility])   [place] [facility] at [facility], Selinda PARAS, MD   5 months ago Essential hypertension   [place] [facility] Care & Sports Medicine at [facility], Leita DEL, MD       Future Appointments             In 1 month Justus Leita DEL, MD [facility] Primary Care & Sports Medicine at [facility], [phone] [place]             atorvastatin  (LIPITOR) 20 MG tablet [Pharmacy Med Name: ATORVASTATIN  20MG  TABLETS] 90 tablet 1    Sig: TAKE 1 TABLET BY MOUTH DAILY     Cardiovascular:  Antilipid - Statins Failed - 10/10/2024  5:50 PM      Failed - Lipid Panel in normal  range within the last 12 months    Cholesterol, Total  Date Value Ref Range Status  11/08/2023 174 100 - 199 mg/dL Final   LDL Chol Calc (NIH)  Date Value Ref Range Status  11/08/2023 103 (H) 0 - 99 mg/dL Final   HDL  Date Value Ref Range Status  11/08/2023 41 >39 mg/dL Final   Triglycerides  Date Value Ref Range Status  11/08/2023 172 (H) 0 - 149 mg/dL Final         Passed - Patient is not pregnant      Passed - Valid encounter within last 12 months    Recent Outpatient Visits           1 month ago Closed compression fracture of lumbosacral spine, initial encounter [facility])   [place] [facility] at [facility], Selinda PARAS, MD   5 months ago Essential hypertension   [facility] Primary Care & Sports Medicine at [facility], Leita DEL, MD       Future Appointments             In 1 month Justus, Leita DEL, MD [facility] Primary Care & Sports Medicine at [facility], [phone] [place]

## 2024-10-10 NOTE — Telephone Encounter (Signed)
 Requested Prescriptions  Refused Prescriptions Disp Refills   olmesartan  (BENICAR ) 20 MG tablet [Pharmacy Med Name: OLMESARTAN  MEDOXOMIL 20MG  TABLETS] 90 tablet 0    Sig: TAKE 1 TABLET(20 MG) BY MOUTH DAILY     Cardiovascular:  Angiotensin Receptor Blockers Passed - 10/10/2024  5:51 PM      Passed - Cr in normal range and within 180 days    Creatinine, Ser  Date Value Ref Range Status  09/13/2024 1.19 0.61 - 1.24 mg/dL Final         Passed - K in normal range and within 180 days    Potassium  Date Value Ref Range Status  09/13/2024 4.4 3.5 - 5.1 mmol/L Final         Passed - Patient is not pregnant      Passed - Last BP in normal range    BP Readings from Last 1 Encounters:  10/03/24 134/78         Passed - Valid encounter within last 6 months    Recent Outpatient Visits           1 month ago Closed compression fracture of lumbosacral spine, initial encounter [facility])   [place] [facility] at [facility], Selinda PARAS, MD   5 months ago Essential hypertension   [facility] Primary Care & Sports Medicine at [facility], Leita DEL, MD       Future Appointments             In 1 month Justus, Leita DEL, MD [facility] Primary Care & Sports Medicine at [facility], [phone] [place]

## 2024-10-14 ENCOUNTER — Telehealth: Payer: Self-pay

## 2024-10-14 NOTE — Telephone Encounter (Signed)
 I received a call from [facility] stating that Mr [facility] inpatient stay on 09/19/24 has been denied by the medical director as not being medically necessary.   A peer to peer can be done, but must be completed within 5 business days. Ref # is [id number]. Ph# for peer to peer: [phone].

## 2024-10-15 NOTE — Telephone Encounter (Signed)
 Yes, it was approved as inpatient on 09/18/24 for 1 day. They are denying his stay on 09/19/24 as not medically necessary.  They can do the peer to peer today. They will call your cell between 1-3pm EST.

## 2024-10-18 ENCOUNTER — Other Ambulatory Visit: Payer: Self-pay | Admitting: Family Medicine

## 2024-10-18 DIAGNOSIS — Z981 Arthrodesis status: Secondary | ICD-10-CM

## 2024-10-29 ENCOUNTER — Ambulatory Visit (INDEPENDENT_AMBULATORY_CARE_PROVIDER_SITE_OTHER)

## 2024-10-29 ENCOUNTER — Ambulatory Visit: Admitting: Neurosurgery

## 2024-10-29 ENCOUNTER — Encounter: Payer: Self-pay | Admitting: Neurosurgery

## 2024-10-29 VITALS — BP 118/80 | Temp 97.9°F | Wt 166.2 lb

## 2024-10-29 DIAGNOSIS — Z981 Arthrodesis status: Secondary | ICD-10-CM | POA: Diagnosis not present

## 2024-10-29 DIAGNOSIS — S32009D Unspecified fracture of unspecified lumbar vertebra, subsequent encounter for fracture with routine healing: Secondary | ICD-10-CM

## 2024-10-29 DIAGNOSIS — S32009A Unspecified fracture of unspecified lumbar vertebra, initial encounter for closed fracture: Secondary | ICD-10-CM

## 2024-10-29 NOTE — Progress Notes (Signed)
   REFERRING PHYSICIAN:  Justus Leita DEL, Md [address] [place],  [place] [address]  Interpreter present- he understands a lot of english but she helped when needed.   DOS: 09/18/24  PSF T11-L2  HISTORY OF PRESENT ILLNESS: Wali Reinheimer is status post above surgery.  He was doing well.  He still has some stiffness in his back.    He is not ready to return to work.   PHYSICAL EXAMINATION:  General: Patient is well developed, well nourished, calm, collected, and in no apparent distress.   NEUROLOGICAL:  General: In no acute distress.   Awake, alert, oriented to person, place, and time.  Pupils equal round and reactive to light.  Facial tone is symmetric.     Strength:  Side Biceps Triceps Deltoid Interossei Grip Wrist Ext. Wrist Flex.  R 5 5 5 5 5 5 5   L 5 5 5 5 5 5 5              Side Iliopsoas Quads Hamstring PF DF EHL  R 5 5 5 5 5 5   L 5 5 5 5 5 5    Incision c/d/I   ROS (Neurologic):  Negative except as noted above  IMAGING: No complications noted  ASSESSMENT/PLAN:  Sidney Kann is doing well s/p above surgery.   I will give him some exercises for his back.  We will see him back in approximately 6 weeks.  I think he will probably be ready to return to work after that visit.  We reviewed activity limitations.  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

## 2024-11-06 ENCOUNTER — Other Ambulatory Visit: Payer: Self-pay | Admitting: Internal Medicine

## 2024-11-06 DIAGNOSIS — I10 Essential (primary) hypertension: Secondary | ICD-10-CM

## 2024-11-06 NOTE — Telephone Encounter (Unsigned)
 Copied from CRM [id number]. Topic: Clinical - Medication Refill >> Nov 06, 2024  9:43 AM Franky GRADE wrote: Medication: olmesartan  (BENICAR ) 20 MG tablet [[id number]]  Has the patient contacted their pharmacy? Yes (Agent: If no, request that the patient contact the pharmacy for the refill. If patient does not wish to contact the pharmacy document the reason why and proceed with request.) (Agent: If yes, when and what did the pharmacy advise?)  This is the patient's preferred pharmacy:  [id number],  - [address] AT [facility] [address] [place] [address] [place] [address] Phone: [phone] Fax: [phone]    Is this the correct pharmacy for this prescription? Yes If no, delete pharmacy and type the correct one.   Has the prescription been filled recently? No  Is the patient out of the medication? No, he will be out by the weekend and just needs a small refill to hold him over until his next appointment on Monday 11/11/2024  Has the patient been seen for an appointment in the last year OR does the patient have an upcoming appointment? Yes , patient prefers a call.  Can we respond through MyChart? No  Agent: Please be advised that Rx refills may take up to 3 business days. We ask that you follow-up with your pharmacy.

## 2024-11-08 MED ORDER — OLMESARTAN MEDOXOMIL 20 MG PO TABS: 20.0000 mg | ORAL_TABLET | Freq: Every evening | ORAL | 0 refills | Status: AC

## 2024-11-08 NOTE — Telephone Encounter (Signed)
 Requested Prescriptions  Pending Prescriptions Disp Refills   olmesartan  (BENICAR ) 20 MG tablet 90 tablet 0    Sig: Take 1 tablet (20 mg total) by mouth at bedtime.     Cardiovascular:  Angiotensin Receptor Blockers Failed - 11/08/2024 10:40 AM      Failed - Valid encounter within last 6 months    Recent Outpatient Visits           2 months ago Closed compression fracture of lumbosacral spine, initial encounter [facility])   [place] [facility] at [facility], Selinda PARAS, MD   6 months ago Essential hypertension    Primary Care & Sports Medicine at [facility], Leita DEL, MD       Future Appointments             In 3 days Justus Leita DEL, MD [facility] Primary Care & Sports Medicine at [facility], [phone] [place]            Passed - Cr in normal range and within 180 days    Creatinine, Ser  Date Value Ref Range Status  09/13/2024 1.19 0.61 - 1.24 mg/dL Final         Passed - K in normal range and within 180 days    Potassium  Date Value Ref Range Status  09/13/2024 4.4 3.5 - 5.1 mmol/L Final         Passed - Patient is not pregnant      Passed - Last BP in normal range    BP Readings from Last 1 Encounters:  10/29/24 118/80

## 2024-11-11 ENCOUNTER — Ambulatory Visit (INDEPENDENT_AMBULATORY_CARE_PROVIDER_SITE_OTHER): Payer: Self-pay | Admitting: Internal Medicine

## 2024-11-11 ENCOUNTER — Encounter: Payer: Self-pay | Admitting: Internal Medicine

## 2024-11-11 VITALS — BP 120/62 | HR 73 | Ht 66.0 in | Wt 165.0 lb

## 2024-11-11 DIAGNOSIS — E782 Mixed hyperlipidemia: Secondary | ICD-10-CM

## 2024-11-11 DIAGNOSIS — C61 Malignant neoplasm of prostate: Secondary | ICD-10-CM

## 2024-11-11 DIAGNOSIS — Z0000 Encounter for general adult medical examination without abnormal findings: Secondary | ICD-10-CM

## 2024-11-11 DIAGNOSIS — E039 Hypothyroidism, unspecified: Secondary | ICD-10-CM | POA: Diagnosis not present

## 2024-11-11 DIAGNOSIS — Z23 Encounter for immunization: Secondary | ICD-10-CM | POA: Diagnosis not present

## 2024-11-11 DIAGNOSIS — I10 Essential (primary) hypertension: Secondary | ICD-10-CM | POA: Diagnosis not present

## 2024-11-11 DIAGNOSIS — R7303 Prediabetes: Secondary | ICD-10-CM

## 2024-11-11 DIAGNOSIS — Z1211 Encounter for screening for malignant neoplasm of colon: Secondary | ICD-10-CM

## 2024-11-11 MED ORDER — ATORVASTATIN CALCIUM 20 MG PO TABS: 20.0000 mg | ORAL_TABLET | Freq: Every evening | ORAL | 1 refills | Status: DC

## 2024-11-11 NOTE — Assessment & Plan Note (Signed)
 Well controlled blood pressure today. Current regimen is olmesartan . No medication side effects noted.

## 2024-11-11 NOTE — Patient Instructions (Signed)
 [name],  We hope you enjoyed your visit with our office! Your feedback means so much to our team, and it helps us  to continue providing the best care possible. If you had a positive experience, we'd love if you could share it by leaving us  a [employer] Review and also completing our patient survey that you'll receive soon.  Your kind words not only brighten our day but also help other patients feel confident in choosing our office for their care.  Thank you for being a part of our practice family!   Dr. Leita Adie & Eual Grieves, CMA, [facility]  Primary Care & Sports Medicine [facility] [address]  [place] [address] Office [phone]  Fax: [phone]'

## 2024-11-11 NOTE — Assessment & Plan Note (Signed)
 Active surveillance. Lab Results  Component Value Date   PSA1 5.4 (H) 05/07/2024   PSA1 5.4 (H) 11/08/2023   PSA1 4.8 (H) 11/04/2022

## 2024-11-11 NOTE — Assessment & Plan Note (Signed)
 LDL is  Lab Results  Component Value Date   LDLCALC 103 (H) 11/08/2023    Current medication regimen is atorvastatin . Goal LDL is < 70.

## 2024-11-11 NOTE — Assessment & Plan Note (Signed)
 Supplemented. Lab Results  Component Value Date   TSH 6.740 (H) 05/07/2024

## 2024-11-11 NOTE — Assessment & Plan Note (Addendum)
 Managed with diet only.  A1c now in diabetic range. Will get labs, advise low carb diet.  Will recheck labs and recommend medications if needed. Lab Results  Component Value Date   HGBA1C 6.6 (H) 05/07/2024

## 2024-11-11 NOTE — Progress Notes (Signed)
 Date:  11/11/2024   Name:  [name]   DOB:  [birth year]   MRN:  [id number]   Chief Complaint: Annual Exam Chipper Koudelka Dollar is a 63 y.o. male who presents today for his Complete Annual Exam. He feels well. He reports exercising. He reports he is sleeping well.  Since he was last seen, he fell and sustained a lumbar fracture - had surgery in September.  Health Maintenance  Topic Date Due   HIV Screening  Never done   Zoster (Shingles) Vaccine (1 of 2) Never done   Colon Cancer Screening  Never done   COVID-19 Vaccine (4 - 2025-26 season) 08/26/2024   Stool Blood Test  05/12/2025   DTaP/Tdap/Td vaccine (2 - Td or Tdap) 11/11/2034   Pneumococcal Vaccine for age over 57  Completed   Flu Shot  Completed   Hepatitis C Screening  Completed   Hepatitis B Vaccine  Aged Out   Meningitis B Vaccine  Aged Out   Lab Results  Component Value Date   PSA1 5.4 (H) 05/07/2024   PSA1 5.4 (H) 11/08/2023   PSA1 4.8 (H) 11/04/2022    Hypertension This is a chronic problem. The problem is controlled. Pertinent negatives include no chest pain, headaches, palpitations or shortness of breath. Past treatments include angiotensin blockers. The current treatment provides significant improvement. There is no history of kidney disease, CAD/MI or CVA. Identifiable causes of hypertension include a thyroid problem.  Thyroid Problem Presents for follow-up visit. Patient reports no anxiety, constipation, diarrhea, fatigue or palpitations. The symptoms have been stable. His past medical history is significant for hyperlipidemia.  Hyperlipidemia This is a chronic problem. The problem is controlled. Pertinent negatives include no chest pain or shortness of breath. Current antihyperlipidemic treatment includes statins.  Prostate cancer - elected surveillance with bi-yearly PSA.  He denies any urinary tract issues, slow stream or dribbling.  Review of Systems  Constitutional:  Negative for  fatigue and unexpected weight change.  HENT:  Negative for nosebleeds and trouble swallowing.   Eyes:  Negative for visual disturbance.  Respiratory:  Negative for cough, chest tightness, shortness of breath and wheezing.   Cardiovascular:  Negative for chest pain, palpitations and leg swelling.  Gastrointestinal:  Negative for abdominal pain, constipation and diarrhea.  Genitourinary:  Negative for frequency, penile pain and testicular pain.  Musculoskeletal:  Positive for back pain (improving).  Neurological:  Negative for dizziness, weakness, light-headedness and headaches.  Psychiatric/Behavioral:  Negative for dysphoric mood and sleep disturbance. The patient is not nervous/anxious.      Lab Results  Component Value Date   NA 137 09/13/2024   K 4.4 09/13/2024   CO2 23 09/13/2024   GLUCOSE 94 09/13/2024   BUN 25 (H) 09/13/2024   CREATININE 1.19 09/13/2024   CALCIUM  9.5 09/13/2024   EGFR 73 11/08/2023   GFRNONAA >60 09/13/2024   Lab Results  Component Value Date   CHOL 174 11/08/2023   HDL 41 11/08/2023   LDLCALC 103 (H) 11/08/2023   TRIG 172 (H) 11/08/2023   CHOLHDL 4.2 11/08/2023   Lab Results  Component Value Date   TSH 6.740 (H) 05/07/2024   Lab Results  Component Value Date   HGBA1C 6.6 (H) 05/07/2024   Lab Results  Component Value Date   WBC 8.9 09/13/2024   HGB 16.3 09/13/2024   HCT 50.0 09/13/2024   MCV 88.3 09/13/2024   PLT 367 09/13/2024   Lab Results  Component Value Date  ALT 55 (H) 11/08/2023   AST 28 11/08/2023   ALKPHOS 87 11/08/2023   BILITOT 0.5 11/08/2023   No results found for: MARIEN BOLLS, VD25OH   Patient Active Problem List   Diagnosis Date Noted   Closed tricolumnar fracture of lumbar vertebra (HCC) 09/18/2024   Spinal instability of lumbar region 09/18/2024   S/P lumbar fusion 09/18/2024   Closed compression fracture of lumbosacral spine (HCC) 08/30/2024   Fall 08/29/2024   Head injury 08/29/2024   Acute  bilateral low back pain without sciatica 08/29/2024   Elevated blood pressure reading 08/29/2024   Primary osteoarthritis of left knee 07/31/2023   Hypothyroidism (acquired) 03/06/2022   Prediabetes 11/04/2020   Carpal tunnel syndrome, bilateral 10/23/2020   Tobacco use disorder, severe, in sustained remission 10/16/2019   Gastroesophageal reflux disease 01/28/2019   Positive PPD, 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   Prostate cancer (HCC) 07/27/2018   Hyperlipidemia, mixed 01/27/2018   Nocturia 10/09/2017   Essential hypertension 10/09/2017    Allergies  Allergen Reactions   Methocarbamol  Other (See Comments)    Indigestion/heartburn    Past Surgical History:  Procedure Laterality Date   ANKLE SURGERY Right    APPLICATION OF INTRAOPERATIVE CT SCAN N/A 09/18/2024   Procedure: APPLICATION OF INTRAOPERATIVE CT SCAN;  Surgeon: Clois Fret, MD;  Location: [facility] ORS;  Service: Neurosurgery;  Laterality: N/A;   CHEST TUBE INSERTION  2012   POSTERIOR LUMBAR FUSION 4 LEVEL N/A 09/18/2024   Procedure: POSTERIOR LUMBAR FUSION 4 LEVEL;  Surgeon: Clois Fret, MD;  Location: [facility] ORS;  Service: Neurosurgery;  Laterality: N/A;  T11-L3 POSTERIOR SPINAL FUSION, OPEN REDUCTION INTERNAL FIXATION (ORIF) L1 FRACTURE    Social History   Tobacco Use   Smoking status: Former    Current packs/day: 0.00    Average packs/day: 0.3 packs/day for 40.0 years (10.0 ttl pk-yrs)    Types: Cigarettes    Start date: 11/25/1977    Quit date: 11/25/2017    Years since quitting: 6.9   Smokeless tobacco: Never  Vaping Use   Vaping status: Never Used  Substance Use Topics   Alcohol use: No   Drug use: No     Medication list has been reviewed and updated.  Current Meds  Medication Sig   aspirin EC 81 MG tablet Take 81 mg by mouth daily. Swallow whole.   bisacodyl (DULCOLAX) 5 MG EC tablet Take 5 mg by mouth at bedtime.   calcitonin, salmon, (MIACALCIN /FORTICAL) 200 UNIT/ACT nasal spray Place 1 spray into  alternate nostrils daily.   levothyroxine  (SYNTHROID ) 75 MCG tablet TAKE 1 TABLET(75 MCG) BY MOUTH DAILY   olmesartan  (BENICAR ) 20 MG tablet Take 1 tablet (20 mg total) by mouth at bedtime.   [DISCONTINUED] atorvastatin  (LIPITOR) 20 MG tablet TAKE 1 TABLET BY MOUTH DAILY (Patient taking differently: Take 20 mg by mouth at bedtime.)       11/11/2024    8:32 AM 05/07/2024    9:43 AM 11/08/2023    8:48 AM 07/31/2023   10:00 AM  GAD 7 : Generalized Anxiety Score  Nervous, Anxious, on Edge 0 0 0 0  Control/stop worrying 0 0 0 0  Worry too much - different things 0 0 0 0  Trouble relaxing 0 0 0 0  Restless 0 0 0 0  Easily annoyed or irritable 0 0 0 0  Afraid - awful might happen 0 0 0 0  Total GAD 7 Score 0 0 0 0  Anxiety Difficulty Not difficult at all Not difficult at  all Not difficult at all Not difficult at all       11/11/2024    8:32 AM 08/30/2024    3:06 PM 05/07/2024    9:43 AM  Depression screen PHQ 2/9  Decreased Interest 0 0 0  Down, Depressed, Hopeless 0 0 0  PHQ - 2 Score 0 0 0  Altered sleeping 0  0  Tired, decreased energy 0  0  Change in appetite 0  0  Feeling bad or failure about yourself  0  0  Trouble concentrating 0  0  Moving slowly or fidgety/restless 0  0  Suicidal thoughts 0  0  PHQ-9 Score 0  0   Difficult doing work/chores Not difficult at all  Not difficult at all     Data saved with a previous flowsheet row definition    BP Readings from Last 3 Encounters:  11/11/24 120/62  10/29/24 118/80  10/03/24 134/78    Physical Exam Vitals and nursing note reviewed.  Constitutional:      Appearance: Normal appearance. He is well-developed.  HENT:     Head: Normocephalic.     Right Ear: Tympanic membrane, ear canal and external ear normal.     Left Ear: Tympanic membrane, ear canal and external ear normal.     Nose: Nose normal.  Eyes:     Conjunctiva/sclera: Conjunctivae normal.     Pupils: Pupils are equal, round, and reactive to light.  Neck:      Thyroid: No thyromegaly.     Vascular: No carotid bruit.  Cardiovascular:     Rate and Rhythm: Normal rate and regular rhythm.     Heart sounds: Normal heart sounds.  Pulmonary:     Effort: Pulmonary effort is normal.     Breath sounds: Normal breath sounds. No wheezing.  Chest:  Breasts:    Right: No mass.     Left: No mass.  Abdominal:     General: Bowel sounds are normal.     Palpations: Abdomen is soft.     Tenderness: There is no abdominal tenderness.  Musculoskeletal:        General: Normal range of motion.       Arms:     Cervical back: Normal range of motion and neck supple.     Comments: Incision healed  Lymphadenopathy:     Cervical: No cervical adenopathy.  Skin:    General: Skin is warm and dry.     Capillary Refill: Capillary refill takes less than 2 seconds.  Neurological:     General: No focal deficit present.     Mental Status: He is alert and oriented to person, place, and time.     Deep Tendon Reflexes: Reflexes are normal and symmetric.  Psychiatric:        Attention and Perception: Attention normal.        Mood and Affect: Mood normal.        Thought Content: Thought content normal.    Diabetic Foot Exam - Simple   Simple Foot Form Diabetic Foot exam was performed with the following findings: Yes 11/11/2024  9:04 AM  Visual Inspection Sensation Testing Intact to touch and monofilament testing bilaterally: Yes Pulse Check Posterior Tibialis and Dorsalis pulse intact bilaterally: Yes Comments      Wt Readings from Last 3 Encounters:  11/11/24 165 lb (74.8 kg)  10/29/24 166 lb 3.2 oz (75.4 kg)  10/03/24 165 lb (74.8 kg)    BP 120/62   Pulse 73  Ht 5' 6 (1.676 m)   Wt 165 lb (74.8 kg)   SpO2 97%   BMI 26.63 kg/m   Assessment and Plan:  Problem List Items Addressed This Visit       Unprioritized   Essential hypertension (Chronic)   Well controlled blood pressure today. Current regimen is olmesartan . No medication side effects  noted.        Relevant Medications   atorvastatin  (LIPITOR) 20 MG tablet   Other Relevant Orders   CBC with Differential/Platelet   Comprehensive metabolic panel with GFR   Microalbumin / creatinine urine ratio   Hyperlipidemia, mixed (Chronic)   LDL is  Lab Results  Component Value Date   LDLCALC 103 (H) 11/08/2023    Current medication regimen is atorvastatin . Goal LDL is < 70.       Relevant Medications   atorvastatin  (LIPITOR) 20 MG tablet   Other Relevant Orders   Lipid panel   Prostate cancer (HCC) (Chronic)   Active surveillance. Lab Results  Component Value Date   PSA1 5.4 (H) 05/07/2024   PSA1 5.4 (H) 11/08/2023   PSA1 4.8 (H) 11/04/2022          Relevant Orders   PSA   Prediabetes (Chronic)   Managed with diet only.  A1c now in diabetic range. Will get labs, advise low carb diet.  Will recheck labs and recommend medications if needed. Lab Results  Component Value Date   HGBA1C 6.6 (H) 05/07/2024         Relevant Orders   Hemoglobin A1c   Hypothyroidism (acquired) (Chronic)   Supplemented. Lab Results  Component Value Date   TSH 6.740 (H) 05/07/2024         Relevant Orders   TSH + free T4   Other Visit Diagnoses       Annual physical exam    -  Primary   Tdap and Prevnar 20 today     Colon cancer screening       has + FIT so needs colonoscopy   Relevant Orders   Ambulatory referral to Gastroenterology     Encounter for immunization       Relevant Orders   Pneumococcal conjugate vaccine 20-valent (Completed)   Tdap vaccine greater than or equal to 7yo IM (Completed)       Return in about 4 months (around 03/11/2025) for HTN, DM  Dr. Lemon.    Leita HILARIO Adie, MD [facility] [place]

## 2024-11-28 ENCOUNTER — Other Ambulatory Visit: Payer: Self-pay | Admitting: Internal Medicine

## 2024-11-28 DIAGNOSIS — I10 Essential (primary) hypertension: Secondary | ICD-10-CM

## 2024-11-30 NOTE — Telephone Encounter (Signed)
 Requested Prescriptions  Refused Prescriptions Disp Refills   olmesartan  (BENICAR ) 20 MG tablet [Pharmacy Med Name: OLMESARTAN  MEDOXOMIL 20MG  TABLETS] 90 tablet 0    Sig: TAKE 1 TABLET(20 MG) BY MOUTH AT BEDTIME     Cardiovascular:  Angiotensin Receptor Blockers Passed - 11/30/2024  6:53 PM      Passed - Cr in normal range and within 180 days    Creatinine, Ser  Date Value Ref Range Status  09/13/2024 1.19 0.61 - 1.24 mg/dL Final         Passed - K in normal range and within 180 days    Potassium  Date Value Ref Range Status  09/13/2024 4.4 3.5 - 5.1 mmol/L Final         Passed - Patient is not pregnant      Passed - Last BP in normal range    BP Readings from Last 1 Encounters:  11/11/24 120/62         Passed - Valid encounter within last 6 months    Recent Outpatient Visits           2 weeks ago Annual physical exam   [place] [facility] at [facility], Leita DEL, MD   3 months ago Closed compression fracture of lumbosacral spine, initial encounter [facility])   [place] [facility] at [facility], Selinda PARAS, MD   6 months ago Essential hypertension   [facility] at [facility], Leita DEL, MD

## 2024-12-02 NOTE — Progress Notes (Addendum)
" ° °  REFERRING PHYSICIAN:  Justus Leita DEL, Md [address] [place],  [place] [address]  Interpreter present- he understands a lot of english but she helped when needed.   DOS: 09/18/24  PSF T11-L2  HISTORY OF PRESENT ILLNESS:  He was doing well at his last visit. He still had some stiffness in his back and was not ready to return back to work.   He still has some lower back soreness with walking or turning. He has pain with bending as well. Numbness in his legs has resolved.   He did PT and feels stronger. He is ready to go back to work tomorrow.   PHYSICAL EXAMINATION:  General: Patient is well developed, well nourished, calm, collected, and in no apparent distress.   NEUROLOGICAL:  General: In no acute distress.   Awake, alert, oriented to person, place, and time.  Pupils equal round and reactive to light.  Facial tone is symmetric.     Strength:  Side Biceps Triceps Deltoid Interossei Grip Wrist Ext. Wrist Flex.  R 5 5 5 5 5 5 5   L 5 5 5 5 5 5 5             Side Iliopsoas Quads Hamstring PF DF EHL  R 5 5 5 5 5 5   L 5 5 5 5 5 5    Incision well healed   ROS (Neurologic):  Negative except as noted above  IMAGING: Thoracolumbar xrays dated 12/09/24:  No complications noted.  Report for above xrays not yet available.    ASSESSMENT/PLAN:  [name] is doing well s/p above surgery. Treatment options reviewed with patient and following plan made:   - He can slowly return to activity as tolerated.  - Will limit his lifting at work for next 2 weeks. Note given. Will let me know if this needs extended.  - He will follow up with Dr. Clois in 6 months with repeat xrays.   Advised to contact the office if any questions or concerns arise.  ADDENDUM 12/30/24:  Thoracolumbar xrays dated 12/09/24:  FINDINGS: T11-L2 posterior fusion noted. The hardware is intact. Progression of compression fracture of L1 since the prior radiograph. 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 Lower lumbar facet arthropathy. The soft tissues are unremarkable.   IMPRESSION: 1. T11-L2 posterior fusion. 2. Progression of compression fracture of L1 since the prior radiograph.     Electronically Signed   By: Vanetta Chou M.D.   On: 12/18/2024 20:38  I have personally reviewed the images and agree with the above interpretation.  Above xrays reviewed with Dr. Clois. No change to above plan. Fracture appears to be healing.   Glade Boys PA-C Department of neurosurgery "

## 2024-12-03 ENCOUNTER — Other Ambulatory Visit: Payer: Self-pay

## 2024-12-03 DIAGNOSIS — S32009A Unspecified fracture of unspecified lumbar vertebra, initial encounter for closed fracture: Secondary | ICD-10-CM

## 2024-12-03 DIAGNOSIS — M532X6 Spinal instabilities, lumbar region: Secondary | ICD-10-CM

## 2024-12-09 ENCOUNTER — Ambulatory Visit: Admitting: Orthopedic Surgery

## 2024-12-09 ENCOUNTER — Encounter: Payer: Self-pay | Admitting: Orthopedic Surgery

## 2024-12-09 ENCOUNTER — Ambulatory Visit

## 2024-12-09 VITALS — BP 138/86 | Wt 165.0 lb

## 2024-12-09 DIAGNOSIS — Z981 Arthrodesis status: Secondary | ICD-10-CM

## 2024-12-09 DIAGNOSIS — M532X6 Spinal instabilities, lumbar region: Secondary | ICD-10-CM

## 2024-12-09 DIAGNOSIS — S32019D Unspecified fracture of first lumbar vertebra, subsequent encounter for fracture with routine healing: Secondary | ICD-10-CM | POA: Diagnosis not present

## 2024-12-09 DIAGNOSIS — S32009A Unspecified fracture of unspecified lumbar vertebra, initial encounter for closed fracture: Secondary | ICD-10-CM

## 2024-12-20 ENCOUNTER — Other Ambulatory Visit: Payer: Self-pay | Admitting: Internal Medicine

## 2024-12-20 DIAGNOSIS — E782 Mixed hyperlipidemia: Secondary | ICD-10-CM

## 2024-12-20 DIAGNOSIS — E039 Hypothyroidism, unspecified: Secondary | ICD-10-CM

## 2024-12-20 NOTE — Telephone Encounter (Signed)
 Copied from CRM #[id number]. Topic: Clinical - Medication Refill >> Dec 20, 2024  8:34 AM Everette C wrote: Medication: atorvastatin  (LIPITOR) 20 MG tablet [[id number]]  levothyroxine  (SYNTHROID ) 75 MCG tablet [[id number]]  Has the patient contacted their pharmacy? Yes (Agent: If no, request that the patient contact the pharmacy for the refill. If patient does not wish to contact the pharmacy document the reason why and proceed with request.) (Agent: If yes, when and what did the pharmacy advise?)  This is the patient's preferred pharmacy:  [id number], [place] - [address] AT [facility] [address] [place] [address] Phone: [phone] Fax: [phone]  Is this the correct pharmacy for this prescription? Yes If no, delete pharmacy and type the correct one.   Has the prescription been filled recently? Yes  Is the patient out of the medication? Yes  Has the patient been seen for an appointment in the last year OR does the patient have an upcoming appointment? Yes  Can we respond through MyChart? No  Agent: Please be advised that Rx refills may take up to 3 business days. We ask that you follow-up with your pharmacy.

## 2024-12-23 MED ORDER — LEVOTHYROXINE SODIUM 75 MCG PO TABS: 75.0000 ug | ORAL_TABLET | Freq: Every day | ORAL | 0 refills | Status: AC

## 2024-12-23 NOTE — Telephone Encounter (Signed)
 Requested Prescriptions  Pending Prescriptions Disp Refills   atorvastatin  (LIPITOR) 20 MG tablet 90 tablet 1    Sig: Take 1 tablet (20 mg total) by mouth at bedtime.     Cardiovascular:  Antilipid - Statins Failed - 12/23/2024 12:44 PM      Failed - Lipid Panel in normal range within the last 12 months    Cholesterol, Total  Date Value Ref Range Status  11/08/2023 174 100 - 199 mg/dL Final   LDL Chol Calc (NIH)  Date Value Ref Range Status  11/08/2023 103 (H) 0 - 99 mg/dL Final   HDL  Date Value Ref Range Status  11/08/2023 41 >39 mg/dL Final   Triglycerides  Date Value Ref Range Status  11/08/2023 172 (H) 0 - 149 mg/dL Final         Passed - Patient is not pregnant      Passed - Valid encounter within last 12 months    Recent Outpatient Visits           1 month ago Annual physical exam   [place] [facility] at [facility], Leita DEL, MD   3 months ago Closed compression fracture of lumbosacral spine, initial encounter [facility])   [place] [facility] at [facility], Selinda PARAS, MD   7 months ago Essential hypertension   [place] [facility] at [facility], Leita DEL, MD               levothyroxine  (SYNTHROID ) 75 MCG tablet 90 tablet 0    Sig: Take 1 tablet (75 mcg total) by mouth daily before breakfast.     Endocrinology:  Hypothyroid Agents Failed - 12/23/2024 12:44 PM      Failed - TSH in normal range and within 360 days    TSH  Date Value Ref Range Status  05/07/2024 6.740 (H) 0.450 - 4.500 uIU/mL Final         Passed - Valid encounter within last 12 months    Recent Outpatient Visits           1 month ago Annual physical exam   [place] [facility] at [facility], Leita DEL, MD   3 months ago Closed compression fracture of lumbosacral spine, initial encounter [facility])   [place] [facility] at  [facility], Selinda PARAS, MD   7 months ago Essential hypertension   [facility] at [facility], Leita DEL, MD

## 2024-12-24 ENCOUNTER — Telehealth: Payer: Self-pay

## 2024-12-24 ENCOUNTER — Other Ambulatory Visit: Payer: Self-pay

## 2024-12-24 DIAGNOSIS — E782 Mixed hyperlipidemia: Secondary | ICD-10-CM

## 2024-12-24 MED ORDER — ATORVASTATIN CALCIUM 20 MG PO TABS: 20.0000 mg | ORAL_TABLET | Freq: Every evening | ORAL | 0 refills | Status: AC

## 2024-12-24 NOTE — Telephone Encounter (Signed)
 Copied from CRM #[id number]. Topic: Clinical - Prescription Issue >> Dec 24, 2024 10:35 AM Olam RAMAN wrote: Reason for CRM: pt spouse was calling about atorvastatin  (LIPITOR) 20 MG tablet. Stated pharmacy had nothing as of yet, I advised 3 b day time frame and had showed it was denied refill too soon. levothyroxine  (SYNTHROID ) 75 MCG tablet was approved.

## 2024-12-24 NOTE — Telephone Encounter (Signed)
 Refill sent ? ?KP ?

## 2025-03-19 ENCOUNTER — Ambulatory Visit: Admitting: Student

## 2025-04-24 ENCOUNTER — Ambulatory Visit: Admitting: Student

## 2025-06-10 ENCOUNTER — Ambulatory Visit: Admitting: Neurosurgery
